# Patient Record
Sex: Male | Born: 1945 | Race: Black or African American | Hispanic: No | Marital: Married | Smoking: Former smoker
Health system: Southern US, Community
[De-identification: ages and names within clinical notes are randomized; demographics above are authoritative.]

## PROBLEM LIST (undated history)

## (undated) DIAGNOSIS — I10 Essential (primary) hypertension: Secondary | ICD-10-CM

## (undated) DIAGNOSIS — R768 Other specified abnormal immunological findings in serum: Secondary | ICD-10-CM

## (undated) DIAGNOSIS — E785 Hyperlipidemia, unspecified: Secondary | ICD-10-CM

## (undated) DIAGNOSIS — K219 Gastro-esophageal reflux disease without esophagitis: Secondary | ICD-10-CM

## (undated) DIAGNOSIS — J449 Chronic obstructive pulmonary disease, unspecified: Secondary | ICD-10-CM

## (undated) DIAGNOSIS — M79609 Pain in unspecified limb: Secondary | ICD-10-CM

## (undated) DIAGNOSIS — M1990 Unspecified osteoarthritis, unspecified site: Secondary | ICD-10-CM

## (undated) DIAGNOSIS — Z86010 Personal history of colonic polyps: Secondary | ICD-10-CM

## (undated) DIAGNOSIS — R972 Elevated prostate specific antigen [PSA]: Secondary | ICD-10-CM

## (undated) DIAGNOSIS — Z87898 Personal history of other specified conditions: Secondary | ICD-10-CM

## (undated) DIAGNOSIS — M549 Dorsalgia, unspecified: Secondary | ICD-10-CM

## (undated) DIAGNOSIS — IMO0001 Reserved for inherently not codable concepts without codable children: Secondary | ICD-10-CM

## (undated) DIAGNOSIS — J309 Allergic rhinitis, unspecified: Secondary | ICD-10-CM

## (undated) DIAGNOSIS — K5790 Diverticulosis of intestine, part unspecified, without perforation or abscess without bleeding: Secondary | ICD-10-CM

## (undated) HISTORY — DX: Personal history of other specified conditions: Z87.898

## (undated) HISTORY — PX: FOOT SURGERY: SHX648

## (undated) HISTORY — DX: Elevated prostate specific antigen (PSA): R97.20

## (undated) HISTORY — DX: Gastro-esophageal reflux disease without esophagitis: K21.9

## (undated) HISTORY — DX: Allergic rhinitis, unspecified: J30.9

## (undated) HISTORY — DX: Other specified abnormal immunological findings in serum: R76.8

## (undated) HISTORY — PX: COLONOSCOPY: SHX174

## (undated) HISTORY — DX: Essential (primary) hypertension: I10

## (undated) HISTORY — DX: Hyperlipidemia, unspecified: E78.5

## (undated) HISTORY — DX: Personal history of colonic polyps: Z86.010

## (undated) HISTORY — DX: Pain in unspecified limb: M79.609

## (undated) HISTORY — DX: Dorsalgia, unspecified: M54.9

## (undated) HISTORY — DX: Unspecified osteoarthritis, unspecified site: M19.90

## (undated) HISTORY — DX: Diverticulosis of intestine, part unspecified, without perforation or abscess without bleeding: K57.90

---

## 2000-05-08 ENCOUNTER — Inpatient Hospital Stay (HOSPITAL_COMMUNITY): Admission: EM | Admit: 2000-05-08 | Discharge: 2000-05-09 | Payer: Self-pay | Admitting: *Deleted

## 2001-03-05 ENCOUNTER — Encounter: Payer: Self-pay | Admitting: Emergency Medicine

## 2001-03-05 ENCOUNTER — Emergency Department (HOSPITAL_COMMUNITY): Admission: EM | Admit: 2001-03-05 | Discharge: 2001-03-05 | Payer: Self-pay | Admitting: Emergency Medicine

## 2002-02-01 ENCOUNTER — Encounter: Payer: Self-pay | Admitting: Orthopedic Surgery

## 2002-02-03 ENCOUNTER — Inpatient Hospital Stay (HOSPITAL_COMMUNITY): Admission: RE | Admit: 2002-02-03 | Discharge: 2002-02-04 | Payer: Self-pay | Admitting: Orthopedic Surgery

## 2002-02-03 ENCOUNTER — Encounter: Payer: Self-pay | Admitting: Orthopedic Surgery

## 2003-12-27 ENCOUNTER — Observation Stay (HOSPITAL_COMMUNITY): Admission: RE | Admit: 2003-12-27 | Discharge: 2003-12-28 | Payer: Self-pay | Admitting: Orthopedic Surgery

## 2004-10-16 ENCOUNTER — Ambulatory Visit (HOSPITAL_BASED_OUTPATIENT_CLINIC_OR_DEPARTMENT_OTHER): Admission: RE | Admit: 2004-10-16 | Discharge: 2004-10-16 | Payer: Self-pay | Admitting: Orthopedic Surgery

## 2004-10-16 ENCOUNTER — Ambulatory Visit (HOSPITAL_COMMUNITY): Admission: RE | Admit: 2004-10-16 | Discharge: 2004-10-16 | Payer: Self-pay | Admitting: Orthopedic Surgery

## 2004-11-17 ENCOUNTER — Ambulatory Visit: Payer: Self-pay | Admitting: Internal Medicine

## 2004-11-20 ENCOUNTER — Ambulatory Visit: Payer: Self-pay | Admitting: Internal Medicine

## 2005-11-30 ENCOUNTER — Ambulatory Visit: Payer: Self-pay | Admitting: Internal Medicine

## 2005-12-03 ENCOUNTER — Ambulatory Visit: Payer: Self-pay | Admitting: Internal Medicine

## 2007-02-10 ENCOUNTER — Ambulatory Visit: Payer: Self-pay | Admitting: Internal Medicine

## 2007-02-10 LAB — CONVERTED CEMR LAB
Albumin: 3.9 g/dL (ref 3.5–5.2)
Basophils Absolute: 0 10*3/uL (ref 0.0–0.1)
Bilirubin Urine: NEGATIVE
Bilirubin, Direct: 0.3 mg/dL (ref 0.0–0.3)
CO2: 23 meq/L (ref 19–32)
Calcium: 9.7 mg/dL (ref 8.4–10.5)
Chloride: 105 meq/L (ref 96–112)
Direct LDL: 118.3 mg/dL
Eosinophils Absolute: 0.1 10*3/uL (ref 0.0–0.6)
GFR calc Af Amer: 79 mL/min
HDL: 36.3 mg/dL — ABNORMAL LOW (ref >39.0)
Hemoglobin, Urine: NEGATIVE
MCHC: 34.8 g/dL (ref 30.0–36.0)
MCV: 95.9 fL (ref 78.0–100.0)
Monocytes Relative: 9.1 % (ref 3.0–11.0)
Neutro Abs: 5.5 10*3/uL (ref 1.4–7.7)
Neutrophils Relative %: 56.4 % (ref 43.0–77.0)
PSA: 12.17 ng/mL — ABNORMAL HIGH (ref 0.10–4.00)
Potassium: 4.2 meq/L (ref 3.5–5.1)
RBC: 4.13 M/uL — ABNORMAL LOW (ref 4.22–5.81)
RDW: 11.4 % — ABNORMAL LOW (ref 11.5–14.6)
Specific Gravity, Urine: 1.025 (ref 1.000–1.03)
TSH: 0.33 microintl units/mL — ABNORMAL LOW (ref 0.35–5.50)
Total Bilirubin: 1.1 mg/dL (ref 0.3–1.2)
Total Protein, Urine: NEGATIVE mg/dL
Total Protein: 7.6 g/dL (ref 6.0–8.3)
Urine Glucose: NEGATIVE mg/dL
VLDL: 65 mg/dL — ABNORMAL HIGH (ref 0–40)
pH: 6 (ref 5.0–8.0)

## 2007-02-16 ENCOUNTER — Encounter: Payer: Self-pay | Admitting: Internal Medicine

## 2007-02-16 ENCOUNTER — Ambulatory Visit: Payer: Self-pay | Admitting: Internal Medicine

## 2007-02-16 DIAGNOSIS — J309 Allergic rhinitis, unspecified: Secondary | ICD-10-CM

## 2007-02-16 DIAGNOSIS — E785 Hyperlipidemia, unspecified: Secondary | ICD-10-CM

## 2007-02-16 DIAGNOSIS — R972 Elevated prostate specific antigen [PSA]: Secondary | ICD-10-CM

## 2007-02-16 DIAGNOSIS — I10 Essential (primary) hypertension: Secondary | ICD-10-CM

## 2007-02-16 DIAGNOSIS — M549 Dorsalgia, unspecified: Secondary | ICD-10-CM

## 2007-02-16 HISTORY — DX: Dorsalgia, unspecified: M54.9

## 2007-02-16 HISTORY — DX: Hyperlipidemia, unspecified: E78.5

## 2007-02-16 HISTORY — DX: Elevated prostate specific antigen (PSA): R97.20

## 2007-02-16 HISTORY — DX: Allergic rhinitis, unspecified: J30.9

## 2007-02-16 HISTORY — DX: Essential (primary) hypertension: I10

## 2007-02-25 ENCOUNTER — Ambulatory Visit: Payer: Self-pay | Admitting: Internal Medicine

## 2007-03-08 ENCOUNTER — Ambulatory Visit: Payer: Self-pay | Admitting: Internal Medicine

## 2007-03-08 ENCOUNTER — Encounter: Payer: Self-pay | Admitting: Internal Medicine

## 2007-03-08 DIAGNOSIS — Z860100 Personal history of colon polyps, unspecified: Secondary | ICD-10-CM

## 2007-03-08 HISTORY — DX: Personal history of colonic polyps: Z86.010

## 2007-03-08 HISTORY — DX: Personal history of colon polyps, unspecified: Z86.0100

## 2007-03-08 LAB — HM COLONOSCOPY

## 2008-02-27 ENCOUNTER — Ambulatory Visit: Payer: Self-pay | Admitting: Internal Medicine

## 2008-02-27 DIAGNOSIS — Z87898 Personal history of other specified conditions: Secondary | ICD-10-CM

## 2008-02-27 DIAGNOSIS — M79609 Pain in unspecified limb: Secondary | ICD-10-CM

## 2008-02-27 HISTORY — DX: Pain in unspecified limb: M79.609

## 2008-02-27 HISTORY — DX: Personal history of other specified conditions: Z87.898

## 2008-02-27 LAB — CONVERTED CEMR LAB
BUN: 13 mg/dL (ref 6–23)
Bilirubin, Direct: 0.1 mg/dL (ref 0.0–0.3)
CO2: 26 meq/L (ref 19–32)
Calcium: 9.7 mg/dL (ref 8.4–10.5)
Chloride: 103 meq/L (ref 96–112)
Cholesterol: 159 mg/dL (ref 0–200)
Creatinine, Ser: 1.2 mg/dL (ref 0.4–1.5)
Potassium: 3.9 meq/L (ref 3.5–5.1)
Sodium: 137 meq/L (ref 135–145)
Total Bilirubin: 0.8 mg/dL (ref 0.3–1.2)
Total CHOL/HDL Ratio: 3.6

## 2008-02-28 ENCOUNTER — Encounter: Payer: Self-pay | Admitting: Internal Medicine

## 2008-03-16 ENCOUNTER — Encounter: Payer: Self-pay | Admitting: Internal Medicine

## 2008-10-19 ENCOUNTER — Telehealth: Payer: Self-pay | Admitting: Internal Medicine

## 2009-02-13 ENCOUNTER — Telehealth (INDEPENDENT_AMBULATORY_CARE_PROVIDER_SITE_OTHER): Payer: Self-pay | Admitting: *Deleted

## 2009-03-25 ENCOUNTER — Ambulatory Visit: Payer: Self-pay | Admitting: Internal Medicine

## 2009-03-25 LAB — CONVERTED CEMR LAB
ALT: 16 units/L (ref 0–53)
AST: 25 units/L (ref 0–37)
Albumin: 4.2 g/dL (ref 3.5–5.2)
BUN: 13 mg/dL (ref 6–23)
Basophils Absolute: 0 10*3/uL (ref 0.0–0.1)
Bilirubin, Direct: 0.1 mg/dL (ref 0.0–0.3)
Chloride: 102 meq/L (ref 96–112)
Cholesterol: 160 mg/dL (ref 0–200)
Eosinophils Relative: 0.4 % (ref 0.0–5.0)
GFR calc non Af Amer: 78.51 mL/min (ref >60)
HDL: 47.7 mg/dL (ref >39.00)
Hemoglobin: 14.6 g/dL (ref 13.0–17.0)
LDL Cholesterol: 97 mg/dL (ref 0–99)
Lymphocytes Relative: 27.2 % (ref 12.0–46.0)
Lymphs Abs: 2.5 10*3/uL (ref 0.7–4.0)
MCHC: 32.8 g/dL (ref 30.0–36.0)
MCV: 100.8 fL — ABNORMAL HIGH (ref 78.0–100.0)
Monocytes Absolute: 0.7 10*3/uL (ref 0.1–1.0)
Monocytes Relative: 7.8 % (ref 3.0–12.0)
Neutrophils Relative %: 64.2 % (ref 43.0–77.0)
Potassium: 4 meq/L (ref 3.5–5.1)
RBC: 4.41 M/uL (ref 4.22–5.81)
RDW: 11.6 % (ref 11.5–14.6)
Total CHOL/HDL Ratio: 3
VLDL: 15.2 mg/dL (ref 0.0–40.0)

## 2009-06-11 ENCOUNTER — Encounter: Payer: Self-pay | Admitting: Internal Medicine

## 2010-03-25 ENCOUNTER — Encounter (INDEPENDENT_AMBULATORY_CARE_PROVIDER_SITE_OTHER): Payer: Self-pay | Admitting: *Deleted

## 2010-03-27 ENCOUNTER — Telehealth: Payer: Self-pay | Admitting: Internal Medicine

## 2010-05-09 ENCOUNTER — Other Ambulatory Visit: Payer: Self-pay | Admitting: Internal Medicine

## 2010-05-09 ENCOUNTER — Encounter: Payer: Self-pay | Admitting: Internal Medicine

## 2010-05-09 ENCOUNTER — Ambulatory Visit
Admission: RE | Admit: 2010-05-09 | Discharge: 2010-05-09 | Payer: Self-pay | Source: Home / Self Care | Attending: Internal Medicine | Admitting: Internal Medicine

## 2010-05-09 LAB — CBC WITH DIFFERENTIAL/PLATELET
Basophils Absolute: 0 10*3/uL (ref 0.0–0.1)
Basophils Relative: 0.3 % (ref 0.0–3.0)
Eosinophils Absolute: 0 10*3/uL (ref 0.0–0.7)
Eosinophils Relative: 0.2 % (ref 0.0–5.0)
HCT: 42.5 % (ref 39.0–52.0)
Hemoglobin: 14.5 g/dL (ref 13.0–17.0)
Lymphocytes Relative: 17.6 % (ref 12.0–46.0)
Lymphs Abs: 1.6 10*3/uL (ref 0.7–4.0)
MCHC: 34.2 g/dL (ref 30.0–36.0)
MCV: 96.1 fl (ref 78.0–100.0)
Monocytes Absolute: 0.7 10*3/uL (ref 0.1–1.0)
Monocytes Relative: 7 % (ref 3.0–12.0)
Neutro Abs: 6.9 10*3/uL (ref 1.4–7.7)
Neutrophils Relative %: 74.9 % (ref 43.0–77.0)
Platelets: 238 10*3/uL (ref 150.0–400.0)
RBC: 4.42 Mil/uL (ref 4.22–5.81)
RDW: 12.6 % (ref 11.5–14.6)
WBC: 9.3 10*3/uL (ref 4.5–10.5)

## 2010-05-09 LAB — LIPID PANEL
Cholesterol: 170 mg/dL (ref 0–200)
HDL: 44.5 mg/dL (ref >39.00)
LDL Cholesterol: 101 mg/dL — ABNORMAL HIGH (ref 0–99)
Total CHOL/HDL Ratio: 4
Triglycerides: 123 mg/dL (ref 0.0–149.0)
VLDL: 24.6 mg/dL (ref 0.0–40.0)

## 2010-05-09 LAB — BASIC METABOLIC PANEL
BUN: 24 mg/dL — ABNORMAL HIGH (ref 6–23)
CO2: 28 mEq/L (ref 19–32)
Calcium: 9.7 mg/dL (ref 8.4–10.5)
Chloride: 104 mEq/L (ref 96–112)
Creatinine, Ser: 1.4 mg/dL (ref 0.4–1.5)
GFR: 65.48 mL/min (ref >60.00)
Glucose, Bld: 103 mg/dL — ABNORMAL HIGH (ref 70–99)
Potassium: 4.6 mEq/L (ref 3.5–5.1)
Sodium: 137 mEq/L (ref 135–145)

## 2010-05-09 LAB — HEPATIC FUNCTION PANEL
ALT: 18 U/L (ref 0–53)
AST: 27 U/L (ref 0–37)
Albumin: 4.2 g/dL (ref 3.5–5.2)
Alkaline Phosphatase: 72 U/L (ref 39–117)
Bilirubin, Direct: 0.1 mg/dL (ref 0.0–0.3)
Total Bilirubin: 1 mg/dL (ref 0.3–1.2)
Total Protein: 7.7 g/dL (ref 6.0–8.3)

## 2010-05-09 LAB — TSH: TSH: 0.3 u[IU]/mL — ABNORMAL LOW (ref 0.35–5.50)

## 2010-05-27 NOTE — Medication Information (Signed)
Summary: [facility]  [facility]   Imported By: Sherian Rein 06/15/2009 10:04:44  _____________________________________________________________________  External Attachment:    Type:   Image     Comment:   External Document

## 2010-05-27 NOTE — Progress Notes (Signed)
  Phone Note Call from Patient Call back at Home Phone [phone]   Caller: Patient Call For: Jack Navy MD Summary of Call: Pt sched appt for yearly f/u on Jan 13th- --2012. Pt needs refills bp & cholestrol [facility]. Initial call taken by: Verdell Face,  March 27, 2010 9:16 AM    Prescriptions: ZESTORETIC 20-12.5 MG TABS (LISINOPRIL-HYDROCHLOROTHIAZIDE) Take 1 tablet by mouth once a day  #30 Tablet x 1   Entered by:   Ami Bullins CMA   Authorized by:   Jack Navy MD   Signed by:   Bill Salinas CMA on 03/27/2010   Method used:   [facility]ronically [facility]        [id number] ([facility]ail[facility]       [address]       [place], [place]  [address]       Ph: [phone] or [phone]       Fax: [phone]   RxID:   [id number] SIMVASTATIN 40 MG TABS (SIMVASTATIN) 1 by mouth q PM  #30 x 1   Entered by:   Bill Salinas CMA   Authorized by:   Jack Navy MD   Signed by:   Bill Salinas CMA on 03/27/2010   Method used:   Electronically to        [facility] [phone][facility] (retail)       [address]       [place], [place]  [address]       Ph: [phone] or [phone]       Fax: [phone]   RxID:   [id number]

## 2010-05-27 NOTE — Medication Information (Signed)
Summary: [facility]  [facility]   Imported By: Sherian Rein 06/15/2009 10:06:12  _____________________________________________________________________  External Attachment:    Type:   Image     Comment:   External Document

## 2010-05-27 NOTE — Letter (Signed)
Summary: Colonoscopy Letter  [facility]  [address] [place], [place] [address]   Phone: [phone]  Fax: [phone]      March 25, 2010 MRN: [id number]   [facility] [address] [place], [place]  [address]   Dear Mr. Hislop,   According to your medical record, it is time for you to schedule a Colonoscopy. The American Cancer Society recommends this procedure as a method to detect early colon cancer. Patients with a family history of colon cancer, or a personal history of colon polyps or inflammatory bowel disease are at increased risk.  This letter has been generated based on the recommendations made at the time of your procedure. If you feel that in your particular situation this may no longer apply, please contact our office.  Please call our office at [phone] to schedule this appointment or to update your records at your earliest convenience.  Thank you for cooperating with [place] to provide you with the very best care possible.   Sincerely,   Iva Boop, M.D.  [facility] G[facility] Division [phone]

## 2010-05-29 NOTE — Assessment & Plan Note (Signed)
Summary: yearly f/u /medicare / #/ cd   Vital Signs:  Patient profile:   65 year old male Height:      68 inches Weight:      202 pounds BMI:     30.83 Temp:     98.1 degrees F oral Pulse rate:   115 / minute BP sitting:   120 / 72  (left arm) Cuff size:   regular  Vitals Entered By: Lamar Sprinkles, CMA (May 09, 2010 9:47 AM) CC: Medicare wellness   Primary Care Provider:  Rice Walsh  CC:  Medicare wellness.  History of Present Illness: patient presents today for a routine medical exam. In the interval since his last exam he has been doing well.   He does have chronic sinus congestion despite taking the zyrtec D.   He has been following with Dr. Aldean Ast for prostate health with elevated PSA but no diagnosis of prostate cancer. He last saw him n June of '11 and is doing well, able to drop back to once a year visits.   He remains 100% independent in is ADLs. He has no signs or symptoms of depression. He has had no falls and has no increase in fall risk. He is cognitively intact.  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  Family History: fathe- deceased age ?r-  HTN, CAD mother deceased 106- throat cancer, lung cancer brother - died related to alcohol disease brother - sudden death 27 MI brother - died in MVA Sister - deceased - cancer  Social History: 11th grade education  married 1968 2 sons- '69, '77: 1 daughter - '80; 5 grandsons retired on disability from foot injury. Had worked as a Midwife marriage in good health - wife smokes 3 pks/daySmoking Status:  quit > 6 months Packs/Day:  <0.25 Dental Care w/in 6 mos.:  yes Seat Belt Use:  yes  Review of Systems       The patient complains of decreased hearing.  The patient denies anorexia, fever, weight loss, weight gain, vision loss, hoarseness, chest pain, syncope, dyspnea on exertion, peripheral edema, headaches, hemoptysis, abdominal pain, severe indigestion/heartburn, incontinence, genital sores, suspicious skin lesions, transient blindness, depression, unusual weight change, enlarged lymph nodes, and angioedema.    Physical Exam  General:  Well-developed,well-nourished,in no acute distress; alert,appropriate and cooperative throughout examination Head:  normocephalic, atraumatic, and no abnormalities observed.   Eyes:  vision grossly intact, pupils equal, pupils round, pupils reactive to light, corneas and lenses clear, no injection, no optic disk abnormalities, and no retinal abnormalitiies.   Ears:  External ear exam shows no significant lesions or deformities.  Otoscopic examination reveals clear canals, tympanic  membranes are intact bilaterally without bulging, retraction, inflammation or discharge. Hearing is grossly normal bilaterally. Nose:  no external deformity, no external erythema, and no nasal discharge.   Mouth:  Oral mucosa and oropharynx without lesions or exudates.  Teeth in good repair. Neck:  supple, full ROM, no masses, no thyromegaly, and no carotid bruits.   Chest Wall:  No deformities, masses, tenderness or gynecomastia noted. Lungs:  Normal respiratory  effort, chest expands symmetrically. Lungs are clear to auscultation, no crackles or wheezes. Heart:  Normal rate and regular rhythm. S1 and S2 normal without gallop, murmur, click, rub or other extra sounds. Abdomen:  soft, non-tender, normal bowel sounds, no guarding, no abdominal hernia, and no hepatomegaly.   Rectal:  deferred to GU Prostate:  deferred to GU Msk:  normal ROM, no joint tenderness, no joint swelling, no joint warmth, no redness over joints, and no joint instability.   Pulses:  2_ radial and DP pulses Extremities:  No clubbing, cyanosis, edema, or deformity noted with normal full range of motion of all joints.   Neurologic:  alert & oriented X3, cranial nerves II-XII intact, strength normal in all extremities, sensation intact to light touch, gait normal, and DTRs symmetrical and normal.   Skin:  turgor normal, color normal, no suspicious lesions, and no ulcerations.   Cervical Nodes:  no anterior cervical adenopathy and no posterior cervical adenopathy.   Inguinal Nodes:  no R inguinal adenopathy and no L inguinal adenopathy.   Psych:  Oriented X3, memory intact for recent and remote, normally interactive, good eye contact, and not anxious appearing.     Impression & Recommendations:  Problem # 1:  PROSTATE SPECIFIC ANTIGEN, ELEVATED (ICD-790.93) Doing well under the care of Dr. Aldean Ast  Problem # 2:  HYPERTENSION (ICD-401.9)  His updated medication list for this problem includes:    Zestoretic 20-12.5 Mg Tabs (Lisinopril-hydrochlorothiazide) .Marland Kitchen... Take 1 tablet by mouth once a day  Orders: TLB-BMP (Basic Metabolic Panel-BMET) (80048-METABOL)  BP today: 120/72 Prior BP: 118/80 (03/25/2009)  Good control on present medications. Will continue the same.  Problem # 3:  HYPERLIPIDEMIA (ICD-272.4)  His updated medication list for this problem includes:    Simvastatin 40 Mg Tabs (Simvastatin) .Marland Kitchen... 1 by mouth q pm  Orders: TLB-Lipid Panel  (80061-LIPID) TLB-Hepatic/Liver Function Pnl (80076-HEPATIC) TLB-TSH (Thyroid Stimulating Hormone) (84443-TSH)  Addendum - LDL 101 ia very close to gal of 100 or less. Liver functions are normal.  Plan - continue present medication.  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 He is able to walk without assistance and has minimal pain.   Problem # 5:  ALLERGIC RHINITIS (ICD-477.9) He c/o chronic nasal congestion despite use of anti-histamine with decongestant.  Pla - trial of nasal inhalational nasal steroid - fluticasone.   His updated medication list for this problem includes:    Fluticasone Propionate 50 Mcg/act Susp (Fluticasone propionate) .Marland Kitchen... 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  Orders: TLB-CBC Platelet - w/Differential (85025-CBCD)  Problem # 6:  Preventive Health Care (ICD-V70.0)  Unremarkable interval history. His physical exam is normal. Lab results are within normal limits except that TSH is borderline low -no treatment required. Last colonoscopy Nov'08. Immunizations: tetnus October '08; Pneumovax Nov '09. EKG reveals no evidence of ischemia.  In summary - a very nice man who appears to be medically stable. He will return in 1 year or as needed.  Orders: Medicare -1st Annual Wellness Visit [phone])  Complete Medication List: 1)  Clidinium-chlordiazepoxide 2.5-5 Mg Caps (Clidinium-chlordiazepoxide) .... Unit 2)  Simvastatin 40 Mg Tabs (Simvastatin) .Marland Kitchen.. 1 by mouth q pm 3)  Nabumetone 750 Mg Tabs (Nabumetone) .... Take 2 tablet by mouth every night 4)  Zestoretic 20-12.5 Mg Tabs (Lisinopril-hydrochlorothiazide) .... Take 1 tablet by mouth once a day 5)  Zyrtec-d Allergy & Congestion 5-120 Mg Tb12 (Cetirizine-pseudoephedrine) .... Unit 6)  Finasteride 5 Mg Tabs (Finasteride) .... Take 1 tablet everyday as needed 7)  Fluticasone Propionate 50 Mcg/act Susp  (Fluticasone propionate) .Marland Kitchen.. 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  Patient: Jack [name] Note: All result statuses are Final unless otherwise noted.  Tests: (1) BMP (METABOL)   Sodium                    137 mEq/L                   135-145   Potassium                 4.6 mEq/L                   3.5-5.1   Chloride                  104 mEq/L                   96-112   Carbon Dioxide            28 mEq/L                    19-32   Glucose              [H]  103 mg/dL                   60-45   BUN                  [H]  24 mg/dL                    4-09   Creatinine                1.4 mg/dL                   8.1-1.9   Calcium                   9.7 mg/dL                   1.4-78.2   GFR                       65.48 mL/min                >60.00  Tests: (2) Lipid Panel (LIPID)   Cholesterol               170 mg/dL                   9-562     ATP III Classification            Desirable:  < 200 mg/dL                    Borderline High:  200 - 239 mg/dL  High:  > = 240 mg/dL   Triglycerides             123.0 mg/dL                 1.6-109.6     Normal:  <150 mg/dL     Borderline High:  045 - 199 mg/dL   HDL                       40.98 mg/dL                 >11.91   VLDL Cholesterol          24.6 mg/dL                  4.7-82.9   LDL Cholesterol      [H]  562 mg/dL                   1-30  CHO/HDL Ratio:  CHD Risk                             4                    Men          Women     1/2 Average Risk     3.4          3.3     Average Risk          5.0          4.4     2X Average Risk          9.6          7.1     3X Average Risk          15.0          11.0                           Tests: (3) Hepatic/Liver Function Panel (HEPATIC)   Total Bilirubin           1.0 mg/dL                   8.6-5.7   Direct Bilirubin          0.1 mg/dL                   8.4-6.9   Alkaline Phosphatase      72 U/L                      39-117   AST                        27 U/L                      0-37   ALT                       18 U/L                      0-53   Total Protein             7.7 g/dL  6.0-8.3   Albumin                   4.2 g/dL                    0.4-5.4  Tests: (4) TSH (TSH)   FastTSH              [L]  0.30 uIU/mL                 0.35-5.50  Tests: (5) CBC Platelet w/Diff (CBCD)   White Cell Count          9.3 K/uL                    4.5-10.5   Red Cell Count            4.42 Mil/uL                 4.22-5.81   Hemoglobin                14.5 g/dL                   09.8-11.9   Hematocrit                42.5 %                      39.0-52.0   MCV                       96.1 fl                     78.0-100.0   MCHC                      34.2 g/dL                   14.7-82.9   RDW                       12.6 %                      11.5-14.6   Platelet Count            238.0 K/uL                  150.0-400.0   Neutrophil %              74.9 %                      43.0-77.0   Lymphocyte %              17.6 %                      12.0-46.0   Monocyte %                7.0 %                       3.0-12.0   Eosinophils%              0.2 %                       0.0-5.0   Basophils %  0.3 %                       0.0-3.0   Neutrophill Absolute      6.9 K/uL                    1.4-7.7   Lymphocyte Absolute       1.6 K/uL                    0.7-4.0   Monocyte Absolute         0.7 K/uL                    0.1-1.0  Eosinophils, Absolute                             0.0 K/uL                    0.0-0.7   Basophils Absolute        0.0 K/uL                    0.0-0.1Prescriptions: FLUTICASONE PROPIONATE 50 MCG/ACT SUSP (FLUTICASONE PROPIONATE) 1 spray per nostril once a day for chonic allergic rhinnitis with nasal congestion.  #1bottle x 12   Entered and Authorized by:   Jacques Navy MD   Signed by:   Jacques Navy MD on 05/10/2010   Method used:   [facility]        [phone][facility] ([facility]ail[facility]        [address]       [place], [place]  [address]       Ph: [phone] or [phone]       Fax: [phone]   RxID:   [id number] ZESTORETIC 20-12.5 MG TABS (LISINOPRIL-HYDROCHLOROTHIAZIDE) Take 1 tablet by mouth once a day  #30 Tablet x 1   Entered and Authorized by:   Jacques Navy MD   Signed by:   Jacques Navy MD on 05/09/2010   Method used:   Electronically to        [facility] [phone][facility] (retail)       [address]       [place], [place]  [address]       Ph: [phone] or [phone]       Fax: [phone]   RxID:   [id number] SIMVASTATIN 40 MG TABS (SIMVASTATIN) 1 by mouth q PM  #30 x 1   Entered and Authorized by:   Jacques Navy MD   Signed by:   Jacques Navy MD on 05/09/2010   Method used:   Electronically to        [facility] [phone]* (retail)       [address]       [place], [place]  [address]       Ph: [phone] or [phone]       Fax: [phone]   RxID:   [id number] CLIDINIUM-CHLORDIAZEPOXIDE 2.5-5 MG CAPS (CLIDINIUM-CHLORDIAZEPOXIDE) unit  #30 Capsule x PRN   Entered and Authorized by:   Jacques Navy MD   Signed by:   Jacques Navy MD on 05/09/2010   Method used:   [facility]        [facility] [facility] [phone][facility] (retail)       [address]  [place], [place]  [address]       Ph: [phone] or [phone]       Fax: [phone]   RxID:   [id number]    Orders Added: 1)  Medicare -1st Annual Wellness Visit [G0438] 2)  Est. Patient Level III [[address]] 3)  TLB-BMP (Basic Metabolic Panel-BMET) [80048-METABOL] 4)  TLB-Lipid Panel [80061-LIPID] 5)  TLB-Hepatic/Liver Function Pnl [80076-HEPATIC] 6)  TLB-TSH (Thyroid Stimulating Hormone) [84443-TSH] 7)  TLB-CBC Platelet - w/Differential [85025-CBCD]

## 2010-07-27 ENCOUNTER — Other Ambulatory Visit: Payer: Self-pay | Admitting: Internal Medicine

## 2010-09-12 NOTE — H&P (Signed)
   NAME:  Jack [name], FLETT NO.:  [id number]   MEDICAL RECORD NO.:  [id number]                   PATIENT TYPE:  INP   LOCATION:  2899                                 FACILITY:  [facility]   PHYSICIAN:  Nadara Mustard, M.D.                DATE OF BIRTH:  1945-09-05   DATE OF ADMISSION:  02/03/2002  DATE OF DISCHARGE:                                HISTORY & PHYSICAL   HISTORY OF PRESENT ILLNESS:  The patient is a 65 year old gentleman who is  10 months status post a right calcaneal fracture, who underwent appropriate  closed treatment.  The patient has had progressive pain, unable to perform  his activities of daily living and unable to work. Radiographs and CT scan  showed cystic collapse of the subtalar joint.  After failure of conservative  care, the patient presents at this time for a subtalar fusion.   ALLERGIES:  No known drug allergies.   MEDICATIONS:  1. Zyrtec D one p.o. b.i.d.  2. Celebrex 200 mg q.d.  3. Clidinium one q.i.d.  4. Nasacort two puffs q.d.   PAST SURGICAL HISTORY:  None.   FAMILY HISTORY:  Positive for hypertension.   SOCIAL HISTORY:  Negative for alcohol, negative for tobacco. Works as a  Naval architect.   REVIEW OF SYSTEMS:  Positive for hypertension and prostate problems.   PHYSICAL EXAMINATION:  VITAL SIGNS:  Temperature 97.3, pulse 84, respiratory  rate 20, blood pressure 152/88.  GENERAL:  The patient is in no acute distress.  LUNGS:  Clear to auscultation.  HEART:  Regular rate and rhythm.  NECK:  Supple with no bruits.  EXTREMITIES:  Examination of the right foot, he has pain with 10 degrees  inversion and eversion.  He has 30 degrees of inversion and eversion on the  left. He has dorsiflexion to neutral. He has good dorsalis pedis pulse.  He  has pain to palpation of the sinus process.  CT scan showed large cystic  changes in the os calcis with degenerative changes of the posterior facet.   ASSESSMENT:  Arthrosis of  subtalar joint, status post calcaneal fracture.    PLAN:  Will go ahead and set him up for a right subtalar fusion. The risks  and benefits were discussed including infection, neurovascular injury,  persistent pain, failure of fusion, and need for additional surgery. The  patient states he understands and wishes to proceed at this time.                                               Nadara Mustard, M.D.    MVD/MEDQ  D:  02/03/2002  T:  02/06/2002  Job:  [id number]

## 2010-09-12 NOTE — Op Note (Signed)
NAME:  KARLON, SCHLAFER NO.:  [id number]   MEDICAL RECORD NO.:  [id number]          PATIENT TYPE:  AMB   LOCATION:  DSC                          FACILITY:  [facility]   PHYSICIAN:  Nadara Mustard, MD     DATE OF BIRTH:  [birth year]   DATE OF PROCEDURE:  10/16/2004  DATE OF DISCHARGE:                                 OPERATIVE REPORT   PREOPERATIVE DIAGNOSIS:  Painful retained hardware, right ankle, status post  tibiocalcaneal fusion.   PROCEDURE:  Removal of the deep hardware, right ankle.   SURGEON.:  Nadara Mustard, MD   ANESTHESIA:  MAC plus 9 mL of 1% lidocaine local.   DRAINS:  None.   COMPLICATIONS:  None.   DISPOSITION:  To PACU in stable condition.   INDICATION FOR PROCEDURE:  The patient is a 65 year old gentleman status  post tibiocalcaneal fusion.  The patient has undergone treatment and  immobilization, and has healed his fusion site; however, he has backing-out  of 2 of the deep retained screws and presents at this time for removal of  the deep screws which are painful.  Risks and benefits of surgery were  discussed including infection, neurovascular injury, need for additional  surgery.  The patient states he understands and wishes to proceed at this  time.   DESCRIPTION OF PROCEDURE:  The patient was brought to OR room 6 and  underwent a MAC anesthetic.  The patient's right lower extremity was then  prepped using DuraPrep and draped into a sterile field.  Nine milliliters of  1% lidocaine plain were used to locally anesthetize the surgical field.  A  small 1-cm incision was made.  Sharp dissection was carried down to the 2  screws.  The 2 screws were grabbed with pliers and removed.  There was no  purulence, no drainage, no evidence of infection.  The wound was irrigated  with normal saline.  Hemostasis was obtained.  The incision was closed using  3-0 nylon.  The wound was covered with Adaptic, orthopedic sponges, sterile  Webril and a Coban  dressing.  The patient was taken to PACU in stable  condition.   Plan for discharge to home, prescription for Vicodin for pain, change  dressing in 3 days, weightbearing as tolerated without restrictions.       MVD/MEDQ  D:  10/16/2004  T:  10/17/2004  Job:  [id number]

## 2010-09-12 NOTE — Assessment & Plan Note (Signed)
[facility]                             PRIMARY CARE OFFICE NOTE   NAME:Jack [name], Jack [name]                        MRN:          [id number]  DATE:12/03/2005                            DOB:          07-23-1945    Jack [name] is a very pleasant 65 year old [place] American gentleman who  presents for follow-up evaluation and exam.  He was last seen in the office  on November 20, 2004.  In the interval he has been seen by Dr. Aldean Baker.  The  patient continues to have some pain and discomfort from a repair of a  traumatic injury to his leg, which required three full surgeries.  He still  has some hypersensitivity in this area.  He still wears a brace.   The patient has been followed by Dr. Aldean Ast for an elevated PSA.  He is  seen every 6 months.  The patient did have a biopsy of the prostate, which,  by his report, was negative, but he continues to have an elevated PSA.   PAST SURGICAL HISTORY:  Foot surgery x 3 as noted.  No other surgeries.   PAST MEDICAL HISTORY:  1. Usual childhood diseases.  2. Hypertension.  3. Hyperlipidemia.  4. Allergic rhinitis.  5. Elevated PSA.  6. Shingles.   CURRENT MEDICATIONS:  1. Lipitor 10 mg daily.  2. Zestoretic 20/12.5 mg once daily.  3. Nabumetone 750 mg 2 tablets daily.  4. Clidinium 1 q day p.r.n.  5. Zyrtec D p.r.n.  6. __________ hydrocodone 5/500 from Dr. Lajoyce Corners.   FAMILY HISTORY:  Well documented in previous chart note.   SOCIAL HISTORY:  The patient has been married 37 years.  He has 2 sons and 1  daughter, 5 grandchildren.  Patient is fully disabled, but he remains  active.   REVIEW OF SYSTEMS:  Patient has had a 3 pound weight gain in the last year.  No fevers, sweats, chills or other constitutional symptoms.  It has been  more than 18 months since he had an eye exam.  Patient has a full upper  denture, lower partial with no complaints.  Patient has no cardiovascular  disease or problems.  No  respiratory complaints except for sinus congestion.  No GI or GU problems.   PHYSICAL EXAMINATION:  VITAL SIGNS:  Temperature 98.2, blood pressure  131/81, pulse 125, weigh 200.  GENERAL APPEARANCE:  A heavy-set African American gentleman, very well  muscled, who looks his stated age, in no acute distress.  HEENT:  Normocephalic, atraumatic.  __________ and TMs were normal.  Oropharynx revealed full upper dentures, lower partial.  No buccal lesions  were noted.  Posterior pharynx was clear.  Conjunctivae and sclerae were  clear.  Pupils equal, round, react to light and accommodation.  Funduscopic  exam was unremarkable.  NECK:  Supple, without thyromegaly.  No lymphadenopathy was noted in the  cervical and supraclavicular regions.  CHEST:  No CVA tenderness.  LUNGS:  Clear to auscultation and percussion.  CARDIOVASCULAR:  2+ radial pulse.  No JVD.  No coronary artery bypass.  He  had a quiet precordium with a regular rate and rhythm, without murmurs, rubs  or gallops.  ABDOMEN:  Soft.  No guarding or rebound.  No organomegaly or splenomegaly  was noted.  GENITALIA AND PROSTATE EXAMS:  Defer to Dr. Aldean Ast.  EXTREMITIES:  Patient has a well-healed surgical scar at the lateral aspect  of the distal right lower extremity.  Left leg and upper extremities are  normal.  SKIN:  Clear.  NEUROLOGICAL:  Nonfocal with patient being awake, alert, oriented to person,  place, time, and contacts.  Cranial nerves were intact.  Motor strength was  normal.  Cerebellar function was normal.   DATABASE:  Hemoglobin was 15.3 grams, white count was 11,000 with a normal  differential.  Cholesterol 197, HDL 38.9, LDL 88.5, triglycerides elevated  at 315, chemistries with a blood sugar of 102, electrolytes were normal.  Kidney function normal with a creatinine of 1.5 and estimated GFR of 61  ml/min.  Liver functions were normal.  TSH was normal at 0.42, PSA elevated  at 12.45, urinalysis was normal.    ASSESSMENT/PLAN:  1. Hypertension.  Patient is adequately controlled on his present medical      regimen.  Will continue the same.  2. GU:  The patient with an elevated PSA with a negative evaluation to      date, including negative prostate biopsy.  Plan:  Patient is to follow      with Dr. Aldean Ast as noted.  3. Lipids:  Patient with excellent control of his cholesterol on present      drugs.  His triglycerides are elevated and I have advised him to follow      a conscientious low-fat diet and to continue to try and exercise on a      regular basis.  4. Allergic rhinitis:  Patient does well with Zyrtec D.  A refill      prescription is provided.  5. Health maintenance:  Patient would be a candidate for colorectal cancer      screening, which we will need to attend to at his next office visit.   IN SUMMARY:  He is a pleasant gentleman who seems to be medically stable at  this time.  He will continue to follow up with Dr. Aldean Ast and Dr. Lajoyce Corners.  He will return to see me on an as needed basis or in 1 year.                                   Rosalyn Gess Norins, MD   MEN/MedQ  DD:  12/03/2005  DT:  12/03/2005  Job #:  [id number]   cc:   Nadara Mustard, MD  Courtney Paris, MD

## 2010-09-12 NOTE — Op Note (Signed)
NAME:  Jack [name], Jack [name] NO.:  [id number]   MEDICAL RECORD NO.:  [id number]                   PATIENT TYPE:  INP   LOCATION:  2899                                 FACILITY:  [facility]   PHYSICIAN:  Nadara Mustard, M.D.                DATE OF BIRTH:  04/05/1946   DATE OF PROCEDURE:  02/03/2002  DATE OF DISCHARGE:                                 OPERATIVE REPORT   PREOPERATIVE DIAGNOSIS:  Subtalar arthrosis, right foot.   POSTOPERATIVE DIAGNOSIS:  Subtalar arthrosis, right foot.   PROCEDURE:  Subtalar fusion with tibial correction with talar to calcaneal  fusion with a [name] medial screw.   SURGEON:  Nadara Mustard, M.D.   ANESTHESIA:  General.   ESTIMATED BLOOD LOSS:  Minimal.   ANTIBIOTICS:  1 g Kefzol.   TOURNIQUET TIME:  88 minutes at 350 mmHg.   DISPOSITION:  To PACU in stable condition with a [name] dressing.   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  The  patient has failed conservative care and presents at this time for surgical  intervention.  The risks and benefits were discussed.  The patient states he  understands and wishes to proceed at this time.   DESCRIPTION OF PROCEDURE:  The patient was brought to OR room 5 and  underwent a general anesthetic.  After adequate level of anesthesia  obtained, the patient was placed supine on the [name] table and the right  lower extremity was prepped using Duraprep, draped into a sterile field.  An  [name] was used to cover all exposed skin.  The leg was elevated and the  tourniquet inflated to 350 mmHg at the thigh.  The oblique incision was made  across the sinus tarsi.  This was carried through the sinus tarsi fat pad.  A lamina retractor was placed.  Rongeurs, osteotomes, and curettes were used  to remove the articular cartilage from the subtalar joint.  The patient had  multiple pieces of cartilage and cystic changes that were extensively  debrided.  This was debrided back to good, healthy, viable subchondral bone  with good bleeding.  The wound was irrigated with normal saline.  A stab  incision was made, then just medial to the anterior tibial tendon blunt  dissection was carried down to the talar neck.  A guidewire was inserted  across the talar neck into the os calcis.  C-arm fluoroscopy was used to  verify the reduction.  This measured 75 mg.  This was then drilled distally  and proximally for the medial screw.  The proximal cortex was tapped.  The  screw was advanced and once the screw was completely engaged, the proximal  screw head was then advanced across the screw to provide additional  compression.  The wound again  was irrigated with normal saline.  The  subchondral joint was then filled with allograft and Grafton.  This was  compressed with a bone tamp.  The superficial layer was then again  irrigated.  The muscle belly was then sutured over the graft with 2-0  Vicryl.  The  skin was closed with a vertical mattress of 2-0 nylon.  The wound was  covered with Adaptic, orthopedic sponges, and a compressive [name]  dressing was applied.  The tourniquet was then deflated after 88 minutes.  The patient was extubated and taken to the PACU in stable condition.                                               Nadara Mustard, M.D.    MVD/MEDQ  D:  02/03/2002  T:  02/03/2002  Job:  [id number]

## 2010-09-12 NOTE — Op Note (Signed)
NAME:  Jack [name], Jack [name] NO.:  [id number]   MEDICAL RECORD NO.:  [id number]                   PATIENT TYPE:  INP   LOCATION:  2899                                 FACILITY:  [facility]   PHYSICIAN:  Nadara Mustard, M.D.                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   OPERATION/PROCEDURE:  1.  Removal of deep buried screw.  2.  Tibial calcaneal fusion.   SURGEON:  Nadara Mustard, M.D.   ANESTHESIA:  Popliteal block plus LMA.   ESTIMATED BLOOD LOSS:  Minimal.   ANTIBIOTICS:  1 g of Kefzol.   TOURNIQUET TIME:  Esmarch vehicle for approximately 60 minutes.   DISPOSITION:  To the PACU in stable condition with a compressive [name] dressing.   INDICATIONS FOR PROCEDURE:  The patient is a 65 year old gentleman who is  status post right ankle arthroscopy in May 2005 with debridement of  osteochondral defect and degenerative arthritis of the ankle joint.  He is  status post subtalar fusion in October 2003, and still has persistent ankle  pain.  Radiographs show what appears to be a good bony ease of the subtalar  fusion; however, he has persistent symptoms with ambulation with activities  of daily living, pain with range of motion of the ankle.  Has had no relief  with conservative measures including injections and arthroscopy and presents  at this time for tibial calcaneal fusion.  The risks and benefits were  discussed including infection, neurovascular injury, persistent pain,  nonhealing of the wound, need for additional surgery.  The patient states he  understands and wishes to proceed at this time.   DESCRIPTION OF PROCEDURE:  The patient was brought to OR #4 after undergoing  a popliteal block.  The patient underwent an  LMA anesthetic.  After adequate  level of anesthesia was obtained, the patient's was prepped using a DuraPrep  and draped into a sterile field with Ioban used to cover all exposed skin.  Attention was first focused on the retained __________  screw.  A previous  incision dorsally just medial to the anterior tibial tendon was used and  blunt dissection was carried down to the screw.  The guidewire was placed in  the screw and the screw was removed.  The screw head which was a two-piece  system remained in the bone and this was not able to be removed without  significant soft tissue or bony damage and this was left in place.  The  wound was irrigated.  Subcutaneous tissue was closed using 2-0 Vicryl and  the skin was closed using proximate staples.   Attention was then focused laterally.  A Esmarch  was wrapped around the  ankle for tourniquet control.  A lateral incision was made over the fibula  consistent with an incision for ankle fracture.  This was carried sharply  down to bone using subperiosteal dissection.  The distal fibula was excised.  This was performed using an oscillating saw.  The bony fragments from the  previous fusion were removed and there were small areas of fibrous nonunion  which were debrided back to bleeding viable bone.  Small ostial exostosis  were removed as well.  After moving of the fibula, the ankle was placed in  90 degrees of dorsiflexion with neutral inversion and eversion and the bony  cuts were made off the distal tibia and talus perpendicular to the axis of  the tibia.  The bone fragments were removed, the wounds irrigated with  normal saline.  There was good cancellous bone bleeding on the tibia and  talus and these were placed with the foot at 90 degrees of dorsiflexion and  neutral inversion and eversion.  A blocking humeral plate was used and this  was secured with four screws locking proximally and four screws locking  distally.  The wound was again  irrigated and C-arm fluoroscopy verified  reduction in both AP and lateral planes.  The subcutaneous tissue was closed  using 2-0 Vicryl.  The skin was closed using Proximate staples.  The  tourniquet was deflated after 60 minutes and hemostasis was obtained.  The  patient was placed in a [name] dressing with Adaptic, 4 x 4s, Webril  and a compressive dressing applied.  The patient was extubated and taken to  the PACU in stable condition.   The plan is for 23-hour observation and plan for discharge tomorrow.                                               Nadara Mustard, M.D.    MVD/MEDQ  D:  12/27/2003  T:  12/27/2003  Job:  [id number]

## 2010-09-29 ENCOUNTER — Other Ambulatory Visit: Payer: Self-pay | Admitting: Internal Medicine

## 2010-12-30 ENCOUNTER — Other Ambulatory Visit: Payer: Self-pay | Admitting: Internal Medicine

## 2011-01-23 ENCOUNTER — Other Ambulatory Visit: Payer: Self-pay | Admitting: Internal Medicine

## 2011-02-24 ENCOUNTER — Other Ambulatory Visit: Payer: Self-pay | Admitting: Internal Medicine

## 2011-03-20 ENCOUNTER — Other Ambulatory Visit: Payer: Self-pay | Admitting: Internal Medicine

## 2011-04-27 ENCOUNTER — Encounter: Payer: Self-pay | Admitting: Internal Medicine

## 2011-04-28 DIAGNOSIS — M1990 Unspecified osteoarthritis, unspecified site: Secondary | ICD-10-CM

## 2011-04-28 HISTORY — DX: Unspecified osteoarthritis, unspecified site: M19.90

## 2011-04-29 ENCOUNTER — Other Ambulatory Visit: Payer: Self-pay | Admitting: *Deleted

## 2011-04-29 MED ORDER — LISINOPRIL-HYDROCHLOROTHIAZIDE 20-12.5 MG PO TABS: 1.0000 | ORAL_TABLET | Freq: Every day | ORAL | Status: DC

## 2011-05-12 ENCOUNTER — Ambulatory Visit (INDEPENDENT_AMBULATORY_CARE_PROVIDER_SITE_OTHER): Payer: Medicare Other | Admitting: Internal Medicine

## 2011-05-12 ENCOUNTER — Other Ambulatory Visit (INDEPENDENT_AMBULATORY_CARE_PROVIDER_SITE_OTHER): Payer: Medicare Other

## 2011-05-12 DIAGNOSIS — R972 Elevated prostate specific antigen [PSA]: Secondary | ICD-10-CM

## 2011-05-12 DIAGNOSIS — M79609 Pain in unspecified limb: Secondary | ICD-10-CM

## 2011-05-12 DIAGNOSIS — I10 Essential (primary) hypertension: Secondary | ICD-10-CM

## 2011-05-12 DIAGNOSIS — Z0000 Encounter for general adult medical examination without abnormal findings: Secondary | ICD-10-CM

## 2011-05-12 DIAGNOSIS — E785 Hyperlipidemia, unspecified: Secondary | ICD-10-CM

## 2011-05-12 LAB — LIPID PANEL
Cholesterol: 159 mg/dL (ref 0–200)
HDL: 48.6 mg/dL
LDL Cholesterol: 81 mg/dL (ref 0–99)
Total CHOL/HDL Ratio: 3
Triglycerides: 148 mg/dL (ref 0.0–149.0)
VLDL: 29.6 mg/dL (ref 0.0–40.0)

## 2011-05-12 LAB — HEPATIC FUNCTION PANEL
ALT: 21 U/L (ref 0–53)
AST: 31 U/L (ref 0–37)
Albumin: 4.2 g/dL (ref 3.5–5.2)
Alkaline Phosphatase: 68 U/L (ref 39–117)
Bilirubin, Direct: 0.1 mg/dL (ref 0.0–0.3)
Total Bilirubin: 0.7 mg/dL (ref 0.3–1.2)
Total Protein: 7.4 g/dL (ref 6.0–8.3)

## 2011-05-12 LAB — CBC WITH DIFFERENTIAL/PLATELET
Basophils Absolute: 0 10*3/uL (ref 0.0–0.1)
Basophils Relative: 0.5 % (ref 0.0–3.0)
Eosinophils Relative: 0.2 % (ref 0.0–5.0)
HCT: 42 % (ref 39.0–52.0)
Hemoglobin: 14.5 g/dL (ref 13.0–17.0)
Lymphs Abs: 1.6 10*3/uL (ref 0.7–4.0)
MCHC: 34.6 g/dL (ref 30.0–36.0)
Monocytes Absolute: 0.6 10*3/uL (ref 0.1–1.0)
Monocytes Relative: 7.6 % (ref 3.0–12.0)
Neutro Abs: 5.7 10*3/uL (ref 1.4–7.7)
Platelets: 216 10*3/uL (ref 150.0–400.0)
WBC: 8 10*3/uL (ref 4.5–10.5)

## 2011-05-12 LAB — COMPREHENSIVE METABOLIC PANEL
AST: 31 U/L (ref 0–37)
CO2: 25 mEq/L (ref 19–32)
Chloride: 107 mEq/L (ref 96–112)
Creatinine, Ser: 1.3 mg/dL (ref 0.4–1.5)
Glucose, Bld: 123 mg/dL — ABNORMAL HIGH (ref 70–99)
Sodium: 139 mEq/L (ref 135–145)
Total Protein: 7.4 g/dL (ref 6.0–8.3)

## 2011-05-12 NOTE — Patient Instructions (Signed)
Normal exam - you are doing well. Come back next year, sooner if needed.

## 2011-05-12 NOTE — Progress Notes (Signed)
Subjective:    Patient ID: Jack [name], male    DOB: 01/22/1946, 66 y.o.   MRN: [id number]  HPI The patient is here for annual Medicare wellness examination and management of other chronic and acute problems. He is doing well with the single c/o stiff back in the AM, responds to exercise and hot shower.   The risk factors are reflected in the social history.  The roster of all physicians providing medical care to patient - is listed in the Snapshot section of the chart.  Activities of daily living:  The patient is 100% inedpendent in all ADLs: dressing, toileting, feeding as well as independent mobility  Home safety : The patient has smoke detectors in the home. Falls - has made the home environment fall-safe. They wear seatbelts. firearms are present in the home, kept in a safe fashion. There is no violence in the home.   There is no risks for hepatitis, STDs or HIV. There is no   history of blood transfusion. They have no travel history to infectious disease endemic areas of the world.  The patient has seen their dentist in the last six month. They have seen their eye doctor in the last year. They deny any hearing difficulty and have not had audiologic testing in the last year.  They do not  have excessive sun exposure. Discussed the need for sun protection: hats, long sleeves and use of sunscreen if there is significant sun exposure.   Diet: the importance of a healthy diet is discussed. They do have a healthy diet.  The patient has no regular exercise program.  The benefits of regular aerobic exercise were discussed.  Depression screen: there are no signs or vegative symptoms of depression- irritability, change in appetite, anhedonia, sadness/tearfullness.  Cognitive assessment: the patient manages all their financial and personal affairs and is actively engaged.  recalled 3/3 objects at 3 minutes; performed clock-face test normally but labored.  The following portions of the  patient's history were reviewed and updated as appropriate: allergies, current medications, past family history, past medical history,  past surgical history, past social history  and problem list.  Vision, hearing, body mass index were assessed and reviewed.   During the course of the visit the patient was educated and counseled about appropriate screening and preventive services including : fall prevention , diabetes screening, nutrition counseling, colorectal cancer screening, and recommended immunizations.    Review of Systems Constitutional:  Negative for fever, chills, activity change and unexpected weight change.  HEENT:  Negative for hearing loss, ear pain, congestion, neck stiffness and postnasal drip. Negative for sore throat or swallowing problems. Negative for dental complaints.   Eyes: Negative for vision loss or change in visual acuity.  Respiratory: Negative for chest tightness and wheezing. Negative for DOE.   Cardiovascular: Negative for chest pain or palpitations. No decreased exercise tolerance Gastrointestinal: No change in bowel habit. No bloating or gas. No reflux or indigestion Genitourinary: Negative for urgency, frequency, flank pain and difficulty urinating.  Musculoskeletal: Negative for myalgias, back pain, and gait problem. has right knee pain, ankle pain at site of ORIF Neurological: Negative for dizziness, tremors, weakness and headaches.  Hematological: Negative for adenopathy.  Psychiatric/Behavioral: Negative for behavioral problems and dysphoric mood.       Objective:   Physical Exam Filed Vitals:   05/12/11 0902  BP: 136/84  Pulse: 106  Temp: 98.3 F (36.8 C)   Gen'l: Well nourished well developed AA male in no acute  distress  HEENT: Head: Normocephalic and atraumatic. Right Ear: External ear normal. EAC/TM nl. Left Ear: External ear normal.  EAC/TM nl. Nose: Nose normal. Mouth/Throat: Oropharynx is clear and moist. Dentition - full upper denture,  partial lower denture. No buccal or palatal lesions. Posterior pharynx clear. Eyes: Conjunctivae and sclera clear. EOM intact. Pupils are equal, round, and reactive to light. Right eye exhibits no discharge. Left eye exhibits no discharge. Neck: Normal range of motion. Neck supple. No JVD present. No tracheal deviation present. No thyromegaly present.  Cardiovascular: Normal rate, regular rhythm, no gallop, no friction rub, no murmur heard.      Quiet precordium. 2+ radial and DP pulses . No carotid bruits Pulmonary/Chest: Effort normal. No respiratory distress or increased WOB, no wheezes, no rales. No chest wall deformity or CVAT. Abdominal: Soft. Bowel sounds are normal in all quadrants. He exhibits no distension, no tenderness, no rebound or guarding, No heptosplenomegaly  Genitourinary:  deferred to GU Musculoskeletal: Normal range of motion. He exhibits no edema and no tenderness.       Small and large joints without redness, synovial thickening or deformity. Full range of motion preserved about all small, median and large joints.  Lymphadenopathy:    He has no cervical or supraclavicular adenopathy.  Neurological: He is alert and oriented to person, place, and time. CN II-XII intact. DTRs 2+ and symmetrical biceps, radial and patellar tendons. Cerebellar function normal with no tremor, rigidity, normal gait and station.  Skin: Skin is warm and dry. No rash noted. No erythema.  Psychiatric: He has a normal mood and affect. His behavior is normal. Thought content normal.   Lab Results  Component Value Date   WBC 8.0 05/12/2011   HGB 14.5 05/12/2011   HCT 42.0 05/12/2011   PLT 216.0 05/12/2011   GLUCOSE 123* 05/12/2011   CHOL 159 05/12/2011   TRIG 148.0 05/12/2011   HDL 48.60 05/12/2011   LDLDIRECT 118.3 02/10/2007   LDLCALC 81 05/12/2011   ALT 21 05/12/2011   ALT 21 05/12/2011   AST 31 05/12/2011   AST 31 05/12/2011   NA 139 05/12/2011   K 3.9 05/12/2011   CL 107 05/12/2011   CREATININE 1.3  05/12/2011   BUN 21 05/12/2011   CO2 25 05/12/2011   TSH 0.30* 05/09/2010   PSA 3.70 05/12/2011             Assessment & Plan:

## 2011-05-14 DIAGNOSIS — Z0001 Encounter for general adult medical examination with abnormal findings: Secondary | ICD-10-CM | POA: Insufficient documentation

## 2011-05-14 NOTE — Assessment & Plan Note (Signed)
Lab Results  Component Value Date   CHOL 159 05/12/2011   HDL 48.60 05/12/2011   LDLCALC 81 05/12/2011   LDLDIRECT 118.3 02/10/2007   TRIG 148.0 05/12/2011   CHOLHDL 3 05/12/2011   Good control on present medication

## 2011-05-14 NOTE — Assessment & Plan Note (Signed)
PSA at this visit 3.70 normal range.

## 2011-05-14 NOTE — Assessment & Plan Note (Signed)
Interval medical history is unremarkable. Physical exam is normal. Lab results are in normal range. He is current with colorectal cancer screening and prostate screening. Immunizations are current except he is a candidate for shingles vaccine.   IN summary- a nice man who is medically stable. He will continue his present medications. He will return in 1 year or as needed.

## 2011-05-14 NOTE — Assessment & Plan Note (Signed)
BP Readings from Last 3 Encounters:  05/12/11 136/84  05/09/10 120/72  03/25/09 118/80   Adequate control on present regimen

## 2011-05-15 ENCOUNTER — Other Ambulatory Visit: Payer: Self-pay | Admitting: Internal Medicine

## 2011-05-17 ENCOUNTER — Encounter: Payer: Self-pay | Admitting: Internal Medicine

## 2011-07-19 ENCOUNTER — Other Ambulatory Visit: Payer: Self-pay | Admitting: Internal Medicine

## 2011-07-21 NOTE — Telephone Encounter (Signed)
Request for OTC brand [[facility] zyrtec-D 5-120 [last prescription strength refill on this Rx was 03/25/2009. Please advise,

## 2011-07-23 ENCOUNTER — Telehealth: Payer: Self-pay | Admitting: Internal Medicine

## 2011-07-23 NOTE — Telephone Encounter (Signed)
Note made in error

## 2011-07-23 NOTE — Telephone Encounter (Deleted)
Request refill on zy

## 2011-07-24 ENCOUNTER — Other Ambulatory Visit: Payer: Self-pay | Admitting: Internal Medicine

## 2011-07-27 NOTE — Telephone Encounter (Signed)
Last refill 03.24.13 #[id number] DENIED

## 2011-08-21 ENCOUNTER — Other Ambulatory Visit: Payer: Self-pay | Admitting: Internal Medicine

## 2011-11-10 ENCOUNTER — Other Ambulatory Visit: Payer: Self-pay | Admitting: Internal Medicine

## 2011-12-29 ENCOUNTER — Encounter: Payer: Self-pay | Admitting: Internal Medicine

## 2012-05-01 ENCOUNTER — Other Ambulatory Visit: Payer: Self-pay | Admitting: Internal Medicine

## 2012-06-07 ENCOUNTER — Ambulatory Visit (INDEPENDENT_AMBULATORY_CARE_PROVIDER_SITE_OTHER): Payer: Medicare Other | Admitting: Internal Medicine

## 2012-06-07 ENCOUNTER — Other Ambulatory Visit (INDEPENDENT_AMBULATORY_CARE_PROVIDER_SITE_OTHER): Payer: Medicare Other

## 2012-06-07 ENCOUNTER — Encounter: Payer: Self-pay | Admitting: Internal Medicine

## 2012-06-07 VITALS — BP 124/70 | HR 110 | Temp 97.6°F | Resp 10 | Ht 66.0 in | Wt 196.0 lb

## 2012-06-07 DIAGNOSIS — E785 Hyperlipidemia, unspecified: Secondary | ICD-10-CM

## 2012-06-07 DIAGNOSIS — R972 Elevated prostate specific antigen [PSA]: Secondary | ICD-10-CM

## 2012-06-07 DIAGNOSIS — I10 Essential (primary) hypertension: Secondary | ICD-10-CM

## 2012-06-07 DIAGNOSIS — Z0000 Encounter for general adult medical examination without abnormal findings: Secondary | ICD-10-CM

## 2012-06-07 DIAGNOSIS — Z86010 Personal history of colonic polyps: Secondary | ICD-10-CM

## 2012-06-07 LAB — HEPATIC FUNCTION PANEL
ALT: 17 U/L (ref 0–53)
Alkaline Phosphatase: 64 U/L (ref 39–117)
Bilirubin, Direct: 0 mg/dL (ref 0.0–0.3)

## 2012-06-07 LAB — COMPREHENSIVE METABOLIC PANEL
AST: 27 U/L (ref 0–37)
Albumin: 4.1 g/dL (ref 3.5–5.2)
Creatinine, Ser: 1.3 mg/dL (ref 0.4–1.5)
GFR: 70.25 mL/min (ref >60.00)
Glucose, Bld: 101 mg/dL — ABNORMAL HIGH (ref 70–99)
Potassium: 3.9 mEq/L (ref 3.5–5.1)
Total Bilirubin: 0.5 mg/dL (ref 0.3–1.2)

## 2012-06-07 LAB — LIPID PANEL
Cholesterol: 154 mg/dL (ref 0–200)
Total CHOL/HDL Ratio: 4
VLDL: 58.8 mg/dL — ABNORMAL HIGH (ref 0.0–40.0)

## 2012-06-07 NOTE — Patient Instructions (Addendum)
Thanks for coming in.  Your exam is normal.  Will get lab today and the results will be in the report I send you.  Continue all you present medication - Blood pressure is good and we will check the cholesterol.   Stay out of the snow.

## 2012-06-07 NOTE — Progress Notes (Signed)
Subjective:    Patient ID: Jack [name], male    DOB: [birth year], 67 y.o.   MRN: [id number]  HPI The patient is here for annual Medicare wellness examination and management of other chronic and acute problems.  Saw Dr. Lajoyce Corners for back pain. Had course of steroids and then MRI spine followed by recommendation for ESI.    The risk factors are reflected in the social history.  The roster of all physicians providing medical care to patient - is listed in the Snapshot section of the chart.  Activities of daily living:  The patient is 100% inedpendent in all ADLs: dressing, toileting, feeding as well as independent mobility  Home safety : The patient has no  smoke detectors in the home. They wear seatbelts.firearms are present in the home, kept in a safe fashion) There is no violence in the home.   There is no risks for hepatitis, STDs or HIV. There is no   history of blood transfusion. They have no travel history to infectious disease endemic areas of the world.  The patient has not seen their dentist in the last six month. They have seen their eye doctor in the last year. They deny any hearing difficulty and have not had audiologic testing in the last year.    They do not  have excessive sun exposure. Discussed the need for sun protection: hats, long sleeves and use of sunscreen if there is significant sun exposure.   Diet: the importance of a healthy diet is discussed. They do have a healthy  diet.  The patient has no regular exercise program. The benefits of regular aerobic exercise were discussed.  Depression screen: there are no signs or vegative symptoms of depression- irritability, change in appetite, anhedonia, sadness/tearfullness.  Cognitive assessment: the patient manages all their financial and personal affairs and is actively engaged. They could relate day,date,year and events; recalled 3/3 objects at 3 minutes; performed clock-face test normally.  Past Medical History   Diagnosis Date  . ALLERGIC RHINITIS 02/16/2007  . BACK PAIN 02/16/2007  . FOOT PAIN, CHRONIC 02/27/2008  . 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 HYPERLIPIDEMIA 02/16/2007  . HYPERTENSION 02/16/2007  . PROSTATE SPECIFIC ANTIGEN, ELEVATED 02/16/2007  . 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 Foot surgery      reconstruction x's 4 after work accident   Family History  Problem Relation Age of Onset  . Cancer Mother     throat & lung  . Coronary artery disease Father   . Hypertension Father   . Cancer Sister   . Alcohol abuse Brother    History   Social History  . Marital Status: Married    Spouse Name: N/A    Number of Children: N/A  . Years of Education: N/A   Occupational History  . Not on file.   Social History Main Topics  . Smoking status: Never Smoker   . Smokeless tobacco: Never Used  . Alcohol Use: No  . Drug Use: No  . Sexually Active: Yes -- Male partner(s)   Other Topics Concern  . Not on file   Social History Narrative   11 grade education   Married 1968   2 son's='69, '70; 1 dtr '80; 5 grandsons   Retired on disability from foot injury. 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 finasteride (PROSCAR) 5 MG tablet Take 5 mg by mouth daily.        Marland Kitchen lisinopril-hydrochlorothiazide (PRINZIDE,ZESTORETIC) 20-12.5 MG per tablet TAKE 1 TABLET BY MOUTH EVERY DAY  30 tablet  2  . simvastatin (ZOCOR) 40 MG tablet TAKE 1 TABLET BY MOUTH EVERY EVENING  30 tablet  2  . clidinium-chlordiazePOXIDE (LIBRAX) 2.5-5 MG per capsule TAKE AS DIRECTED  30 capsule  1  . [facility] ALLERGY RELIEF-D 5-120 MG per tablet TAKE AS DIRECTED  60 tablet  1   No current facility-administered medications on file prior to visit.     The following portions of the patient's history were reviewed and updated as  appropriate: allergies, current medications, past family history, past medical history,  past surgical history, past social history  and problem list.  Past Medical History  Diagnosis Date  . ALLERGIC RHINITIS 02/16/2007  . BACK PAIN 02/16/2007  . FOOT PAIN, CHRONIC 02/27/2008  . 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 HYPERLIPIDEMIA 02/16/2007  . HYPERTENSION 02/16/2007  . PROSTATE SPECIFIC ANTIGEN, ELEVATED 02/16/2007  . 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 Foot surgery      reconstruction x's 4 after work accident   Family History  Problem Relation Age of Onset  . Cancer Mother     throat & lung  . Coronary artery disease Father   . Hypertension Father   . Cancer Sister   . Alcohol abuse Brother    History   Social History  . Marital Status: Married    Spouse Name: N/A    Number of Children: N/A  . Years of Education: N/A   Occupational History  . Not on file.   Social History Main Topics  . Smoking status: Never Smoker   . Smokeless tobacco: Never Used  . Alcohol Use: No  . Drug Use: No  . Sexually Active: Yes -- Male partner(s)   Other Topics Concern  . 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 finasteride (PROSCAR) 5 MG tablet Take 5 mg by mouth daily.        Marland Kitchen lisinopril-hydrochlorothiazide (PRINZIDE,ZESTORETIC) 20-12.5 MG per tablet TAKE 1 TABLET BY MOUTH EVERY DAY  30 tablet  2  . simvastatin (ZOCOR) 40 MG tablet TAKE 1 TABLET BY MOUTH EVERY EVENING  30 tablet  2  . clidinium-chlordiazePOXIDE (LIBRAX) 2.5-5 MG per capsule TAKE AS DIRECTED  30 capsule  1  . [facility] ALLERGY RELIEF-D 5-120 MG per tablet TAKE AS DIRECTED  60  tablet  1   No current facility-administered medications on file prior to visit.    Vision, hearing, body mass index were assessed and reviewed.   During the course of the visit the patient was educated and counseled about appropriate screening and preventive services including : fall prevention , diabetes screening, nutrition counseling, colorectal cancer screening, and recommended immunizations.    Review of Systems Constitutional:  Negative for fever, chills, activity change and unexpected weight change.  HEENT:  Negative for hearing loss, ear pain, congestion, neck  stiffness and postnasal drip. Negative for sore throat or swallowing problems. Negative for dental complaints.   Eyes: Negative for vision loss or change in visual acuity.  Respiratory: Negative for chest tightness and wheezing. Negative for DOE.   Cardiovascular: Negative for chest pain or palpitations. No decreased exercise tolerance Gastrointestinal: No change in bowel habit. No bloating or gas. No reflux or indigestion Genitourinary: Negative for urgency, frequency, flank pain and difficulty urinating.  Musculoskeletal: Negative for myalgias, back pain, arthralgias and gait problem.  Neurological: Negative for dizziness, tremors, weakness and headaches.  Hematological: Negative for adenopathy.  Psychiatric/Behavioral: Negative for behavioral problems and dysphoric mood.       Objective:   Physical Exam Filed Vitals:   06/07/12 1441  BP: 124/70  Pulse: 110  Temp: 97.6 F (36.4 C)  Resp: 10   Wt Readings from Last 3 Encounters:  06/07/12 196 lb 0.6 oz (88.923 kg)  05/12/11 206 lb (93.441 kg)  05/09/10 202 lb (91.627 kg)   Gen'l: Well nourished well developed AA male in no acute distress  HEENT: Head: Normocephalic and atraumatic. Right Ear: External ear normal. EAC/TM nl. Left Ear: External ear normal.  EAC/TM nl. Nose: Nose normal. Mouth/Throat: Oropharynx is clear and moist. Dentition - full upper denture,  lower partial. No buccal lesions. Posterior pharynx clear. Eyes: Conjunctivae and sclera clear. EOM intact. Pupils are equal, round, and reactive to light. Right eye exhibits no discharge. Left eye exhibits no discharge. Neck: Normal range of motion. Neck supple. No JVD present. No tracheal deviation present. No thyromegaly present.  Cardiovascular: Normal rate, regular rhythm, no gallop, no friction rub, no murmur heard.      Quiet precordium. 2+ radial and DP pulses . No carotid bruits Pulmonary/Chest: Effort normal. No respiratory distress or increased WOB, no wheezes, no rales. No chest wall deformity or CVAT. Abdomen: Soft. Bowel sounds are normal in all quadrants. He exhibits no distension, no tenderness, no rebound or guarding, No heptosplenomegaly  Genitourinary:  deferred to GU Musculoskeletal: Normal range of motion. He exhibits no edema and no tenderness.       Small and large joints without redness, synovial thickening or deformity. Full range of motion preserved about all small, median and large joints.  Lymphadenopathy:    He has no cervical or supraclavicular adenopathy.  Neurological: He is alert and oriented to person, place, and time. CN II-XII intact. DTRs 2+ and symmetrical biceps, radial and patellar tendons. Cerebellar function normal with no tremor, rigidity, normal gait and station.  Skin: Skin is warm and dry. No rash noted. No erythema.  Psychiatric: He has a normal mood and affect. His behavior is normal. Thought content normal.   Lab Results  Component Value Date   WBC 8.0 05/12/2011   HGB 14.5 05/12/2011   HCT 42.0 05/12/2011   PLT 216.0 05/12/2011   GLUCOSE 101* 06/07/2012   CHOL 154 06/07/2012   TRIG 294.0* 06/07/2012   HDL 43.10 06/07/2012   LDLDIRECT 75.2 06/07/2012   LDLCALC 81 05/12/2011        ALT 17 06/07/2012   AST 27 06/07/2012        NA 136 06/07/2012   K 3.9 06/07/2012   CL 103 06/07/2012   CREATININE 1.3 06/07/2012   BUN 15 06/07/2012   CO2 27 06/07/2012    TSH 0.30* 05/09/2010   PSA 3.70 05/12/2011         Assessment & Plan:

## 2012-06-08 ENCOUNTER — Telehealth: Payer: Self-pay | Admitting: *Deleted

## 2012-06-08 ENCOUNTER — Encounter: Payer: Self-pay | Admitting: Internal Medicine

## 2012-06-08 NOTE — Assessment & Plan Note (Signed)
BP Readings from Last 3 Encounters:  06/07/12 124/70  05/12/11 136/84  05/09/10 120/72   Very good control on present medication. Lab reveals normal renal function and electrolytes  Plan Continue present medication.

## 2012-06-08 NOTE — Assessment & Plan Note (Signed)
Interval history - he has been healthy with no major illness, surgery or injury. He is current with colorectal cancer screening. He is follow annually by Urology for elevated PSA - depressed by use of finasteride. Immunizations are current but he is a candidate for shingles vaccine.  In summary - a very nice man who appears to be medically stable. He will return in 1 year, sooner as needed.

## 2012-06-08 NOTE — Telephone Encounter (Signed)
PATIENT WIFE CALLED TO LET YOU KNOW THAT THE [facility] SHE [facility] STATES THEY DO NOT CARRY THE SHINGLES VACCINE. SHE STATES WILL CONTINUE TO CALL AND TRY TO GET  VACCINE AT OTHER PHARMACIES  CB#[phone].

## 2012-06-08 NOTE — Assessment & Plan Note (Signed)
Tolerating medication w/o difficulty. LDL is better than goal of 100 or less and HDL is better than goal of 40+.  Plan Continue present medication.

## 2012-06-08 NOTE — Assessment & Plan Note (Signed)
History of elevated PSA. He is followed by Dr. Mena Goes. He has a history of prostate biopsy negative x 3. Currently stable.

## 2012-06-21 ENCOUNTER — Encounter: Payer: Self-pay | Admitting: Internal Medicine

## 2012-06-21 ENCOUNTER — Encounter: Payer: Medicare Other | Admitting: Internal Medicine

## 2012-06-21 NOTE — Progress Notes (Signed)
This encounter was created in error - please disregard.

## 2012-09-13 ENCOUNTER — Other Ambulatory Visit: Payer: Self-pay | Admitting: Internal Medicine

## 2012-11-09 ENCOUNTER — Other Ambulatory Visit: Payer: Self-pay | Admitting: Internal Medicine

## 2013-03-02 ENCOUNTER — Other Ambulatory Visit: Payer: Self-pay | Admitting: Internal Medicine

## 2013-05-07 ENCOUNTER — Other Ambulatory Visit: Payer: Self-pay | Admitting: Internal Medicine

## 2013-07-25 ENCOUNTER — Encounter: Payer: Medicare Other | Admitting: Internal Medicine

## 2013-08-22 ENCOUNTER — Other Ambulatory Visit: Payer: Self-pay

## 2013-08-22 MED ORDER — SIMVASTATIN 40 MG PO TABS: 40.0000 mg | ORAL_TABLET | Freq: Every day | ORAL | Status: DC

## 2013-09-27 ENCOUNTER — Other Ambulatory Visit (INDEPENDENT_AMBULATORY_CARE_PROVIDER_SITE_OTHER): Payer: Commercial Managed Care - HMO

## 2013-09-27 ENCOUNTER — Ambulatory Visit (INDEPENDENT_AMBULATORY_CARE_PROVIDER_SITE_OTHER): Payer: Commercial Managed Care - HMO | Admitting: Internal Medicine

## 2013-09-27 ENCOUNTER — Encounter: Payer: Self-pay | Admitting: Internal Medicine

## 2013-09-27 ENCOUNTER — Other Ambulatory Visit: Payer: Self-pay | Admitting: Internal Medicine

## 2013-09-27 VITALS — BP 104/62 | HR 110 | Temp 98.1°F | Ht 66.0 in | Wt 192.0 lb

## 2013-09-27 DIAGNOSIS — Z0000 Encounter for general adult medical examination without abnormal findings: Secondary | ICD-10-CM

## 2013-09-27 DIAGNOSIS — Z23 Encounter for immunization: Secondary | ICD-10-CM

## 2013-09-27 DIAGNOSIS — Z136 Encounter for screening for cardiovascular disorders: Secondary | ICD-10-CM

## 2013-09-27 DIAGNOSIS — R7989 Other specified abnormal findings of blood chemistry: Secondary | ICD-10-CM

## 2013-09-27 DIAGNOSIS — K5790 Diverticulosis of intestine, part unspecified, without perforation or abscess without bleeding: Secondary | ICD-10-CM

## 2013-09-27 HISTORY — DX: Diverticulosis of intestine, part unspecified, without perforation or abscess without bleeding: K57.90

## 2013-09-27 LAB — BASIC METABOLIC PANEL
BUN: 16 mg/dL (ref 6–23)
CHLORIDE: 103 meq/L (ref 96–112)
CO2: 27 mEq/L (ref 19–32)
Calcium: 9.4 mg/dL (ref 8.4–10.5)
Creatinine, Ser: 1.2 mg/dL (ref 0.4–1.5)
GFR: 77.43 mL/min (ref >60.00)
GLUCOSE: 98 mg/dL (ref 70–99)
Potassium: 3.6 mEq/L (ref 3.5–5.1)
Sodium: 136 mEq/L (ref 135–145)

## 2013-09-27 LAB — CBC WITH DIFFERENTIAL/PLATELET
BASOS PCT: 0.3 % (ref 0.0–3.0)
Basophils Absolute: 0 10*3/uL (ref 0.0–0.1)
EOS PCT: 0.7 % (ref 0.0–5.0)
Eosinophils Absolute: 0.1 10*3/uL (ref 0.0–0.7)
HCT: 42.2 % (ref 39.0–52.0)
Hemoglobin: 14.1 g/dL (ref 13.0–17.0)
LYMPHS PCT: 27.6 % (ref 12.0–46.0)
Lymphs Abs: 2.9 10*3/uL (ref 0.7–4.0)
MCHC: 33.4 g/dL (ref 30.0–36.0)
MCV: 96.8 fl (ref 78.0–100.0)
MONO ABS: 0.9 10*3/uL (ref 0.1–1.0)
MONOS PCT: 8.6 % (ref 3.0–12.0)
NEUTROS PCT: 62.8 % (ref 43.0–77.0)
Neutro Abs: 6.6 10*3/uL (ref 1.4–7.7)
Platelets: 229 10*3/uL (ref 150.0–400.0)
RBC: 4.36 Mil/uL (ref 4.22–5.81)
RDW: 12.4 % (ref 11.5–15.5)
WBC: 10.5 10*3/uL (ref 4.0–10.5)

## 2013-09-27 LAB — HEPATIC FUNCTION PANEL
ALBUMIN: 4 g/dL (ref 3.5–5.2)
ALT: 11 U/L (ref 0–53)
AST: 21 U/L (ref 0–37)
Alkaline Phosphatase: 66 U/L (ref 39–117)
BILIRUBIN TOTAL: 0.5 mg/dL (ref 0.2–1.2)
Bilirubin, Direct: 0.1 mg/dL (ref 0.0–0.3)
Total Protein: 7.3 g/dL (ref 6.0–8.3)

## 2013-09-27 LAB — LIPID PANEL
CHOL/HDL RATIO: 3
Cholesterol: 155 mg/dL (ref 0–200)
HDL: 46.2 mg/dL (ref >39.00)
LDL Cholesterol: 73 mg/dL (ref 0–99)
NonHDL: 108.8
Triglycerides: 179 mg/dL — ABNORMAL HIGH (ref 0.0–149.0)
VLDL: 35.8 mg/dL (ref 0.0–40.0)

## 2013-09-27 LAB — URINALYSIS, ROUTINE W REFLEX MICROSCOPIC
Bilirubin Urine: NEGATIVE
HGB URINE DIPSTICK: NEGATIVE
Ketones, ur: NEGATIVE
Nitrite: NEGATIVE
RBC / HPF: NONE SEEN (ref 0–?)
Specific Gravity, Urine: <=1.005 — AB (ref 1.000–1.030)
TOTAL PROTEIN, URINE-UPE24: NEGATIVE
URINE GLUCOSE: NEGATIVE
Urobilinogen, UA: 0.2 (ref 0.0–1.0)
pH: 6 (ref 5.0–8.0)

## 2013-09-27 LAB — TSH: TSH: 0.24 u[IU]/mL — AB (ref 0.35–4.50)

## 2013-09-27 MED ORDER — LISINOPRIL-HYDROCHLOROTHIAZIDE 20-12.5 MG PO TABS: 1.0000 | ORAL_TABLET | Freq: Every day | ORAL | Status: DC

## 2013-09-27 MED ORDER — SIMVASTATIN 40 MG PO TABS: 40.0000 mg | ORAL_TABLET | Freq: Every day | ORAL | Status: DC

## 2013-09-27 MED ORDER — FINASTERIDE 5 MG PO TABS: 5.0000 mg | ORAL_TABLET | Freq: Every day | ORAL | Status: DC

## 2013-09-27 NOTE — Progress Notes (Signed)
Subjective:    Patient ID: Jack [name], male    DOB: 09/30/1945, 68 y.o.   MRN: [id number]  HPI  Here for wellness and f/u;  Overall doing ok;  Pt denies CP, worsening SOB, DOE, wheezing, orthopnea, PND, worsening LE edema, palpitations, dizziness or syncope.  Pt denies neurological change such as new headache, facial or extremity weakness.  Pt denies polydipsia, polyuria, or low sugar symptoms. Pt states overall good compliance with treatment and medications, good tolerability, and has been trying to follow lower cholesterol diet.  Pt denies worsening depressive symptoms, suicidal ideation or panic. No fever, night sweats, wt loss, loss of appetite, or other constitutional symptoms.  Pt states good ability with ADL's, has low fall risk, home safety reviewed and adequate, no other significant changes in hearing or vision, and only occasionally active with exercise. To see Dr Enid Derry.urology soon with f/u psa. Pt continues to have recurring right LBP without change in severity, bowel or bladder change, fever, wt loss,  worsening LE pain/numbness/weakness, gait change or falls. Past Medical History  Diagnosis Date  . ALLERGIC RHINITIS 02/16/2007  . BACK PAIN 02/16/2007  . FOOT PAIN, CHRONIC 02/27/2008  . 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 HYPERLIPIDEMIA 02/16/2007  . HYPERTENSION 02/16/2007  . PROSTATE SPECIFIC ANTIGEN, ELEVATED 02/16/2007  . 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 Diverticulosis 09/27/2013   Past Surgical History  Procedure Laterality Date  . Foot surgery      reconstruction x's 4 after work accident    reports that he has never smoked. He has never used smokeless tobacco. He reports that he does not drink alcohol or use illicit drugs. family history includes Alcohol abuse in his brother; Cancer in his mother and sister; Coronary artery disease in his father; Hypertension in his father. No Known Allergies Current Outpatient Prescriptions on File Prior to Visit  Medication Sig Dispense  Refill  . simvastatin (ZOCOR) 40 MG tablet TAKE 1 TABLET BY MOUTH EVERY EVENING  30 tablet  2  . clidinium-chlordiazePOXIDE (LIBRAX) 2.5-5 MG per capsule TAKE AS DIRECTED  30 capsule  1   No current facility-administered medications on file prior to visit.   Review of Systems Constitutional: Negative for increased diaphoresis, other activity, appetite or other siginficant weight change  HENT: Negative for worsening hearing loss, ear pain, facial swelling, mouth sores and neck stiffness.   Eyes: Negative for other worsening pain, redness or visual disturbance.  Respiratory: Negative for shortness of breath and wheezing.   Cardiovascular: Negative for chest pain and palpitations.  Gastrointestinal: Negative for diarrhea, blood in stool, abdominal distention or other pain Genitourinary: Negative for hematuria, flank pain or change in urine volume.  Musculoskeletal: Negative for myalgias or other joint complaints.  Skin: Negative for color change and wound.  Neurological: Negative for syncope and numbness. other than noted Hematological: Negative for adenopathy. or other swelling Psychiatric/Behavioral: Negative for hallucinations, self-injury, decreased concentration or other worsening agitation.      Objective:   Physical Exam BP 104/62  Pulse 110  Temp(Src) 98.1 F (36.7 C) (Oral)  Ht 5\' 6"  (1.676 m)  Wt 192 lb (87.091 kg)  BMI 31.00 kg/m2  SpO2 98% VS noted,  Constitutional: Pt is oriented to person, place, and time. Appears well-developed and well-nourished.  Head: Normocephalic and atraumatic.  Right Ear: External ear normal.  Left Ear: External ear normal.  Nose: Nose normal.  Mouth/Throat: Oropharynx is clear and moist.  Eyes: Conjunctivae and EOM are normal.  Pupils are equal, round, and reactive to light.  Neck: Normal range of motion. Neck supple. No JVD present. No tracheal deviation present.  Cardiovascular: Normal rate, regular rhythm, normal heart sounds and intact  distal pulses.   Pulmonary/Chest: Effort normal and breath sounds without rales or wheezing  Abdominal: Soft. Bowel sounds are normal. NT. No HSM  Musculoskeletal: Normal range of motion. Exhibits no edema.  Lymphadenopathy:  Has no cervical adenopathy.  Neurological: Pt is alert and oriented to person, place, and time. Pt has normal reflexes. No cranial nerve deficit. Motor grossly intact Skin: Skin is warm and dry. No rash noted.  Psychiatric:  Has normal mood and affect. Behavior is normal.     Assessment & Plan:

## 2013-09-27 NOTE — Patient Instructions (Addendum)
You had the new Prevnar pneumonia shot today  We can plan on the older "pneumovax' to be done next year  Please continue all other medications as before, and refills have been done if requested.  Please have the pharmacy call with any other refills you may need.  Please continue your efforts at being more active, low cholesterol diet, and weight control.  You are otherwise up to date with prevention measures today.  Please keep your appointments with your specialists as you have planned - urology  Please go to the LAB in the Basement (turn left off the elevator) for the tests to be done today  You will be contacted by phone if any changes need to be made immediately.  Otherwise, you will receive a letter about your results with an explanation, but please check with MyChart first.  Please return in 1 year for your yearly visit, or sooner if needed, with Lab testing done 3-5 days before

## 2013-09-27 NOTE — Progress Notes (Signed)
Pre visit review using our clinic review tool, if applicable. No additional management support is needed unless otherwise documented below in the visit note. 

## 2013-09-27 NOTE — Assessment & Plan Note (Addendum)

## 2013-09-27 NOTE — Addendum Note (Signed)
Addended by: Sharon Seller B on: 09/27/2013 01:51 PM   Modules accepted: Orders

## 2013-10-05 ENCOUNTER — Encounter: Payer: Self-pay | Admitting: Internal Medicine

## 2013-10-05 ENCOUNTER — Ambulatory Visit (INDEPENDENT_AMBULATORY_CARE_PROVIDER_SITE_OTHER): Payer: Commercial Managed Care - HMO | Admitting: Internal Medicine

## 2013-10-05 VITALS — BP 112/64 | HR 105 | Temp 97.6°F | Resp 12 | Ht 66.0 in | Wt 189.0 lb

## 2013-10-05 DIAGNOSIS — R946 Abnormal results of thyroid function studies: Secondary | ICD-10-CM

## 2013-10-05 DIAGNOSIS — R7989 Other specified abnormal findings of blood chemistry: Secondary | ICD-10-CM

## 2013-10-05 LAB — T3, FREE: T3, Free: 2.9 pg/mL (ref 2.3–4.2)

## 2013-10-05 LAB — TSH: TSH: 0.24 u[IU]/mL — AB (ref 0.35–4.50)

## 2013-10-05 LAB — T4, FREE: Free T4: 0.84 ng/dL (ref 0.60–1.60)

## 2013-10-05 MED ORDER — ATENOLOL 25 MG PO TABS: 25.0000 mg | ORAL_TABLET | Freq: Every day | ORAL | Status: DC

## 2013-10-05 NOTE — Progress Notes (Signed)
Patient ID: Jack [name], male   DOB: 02/13/1946, 68 y.o.   MRN: [id number]   HPI  Jack HRDLICKA is a 68 y.o.-year-old male, referred by his PCP, Dr. Jenny [name], in consultation for low TSH levels.  I reviewed pt's thyroid tests: Lab Results  Component Value Date   TSH 0.24* 09/27/2013   TSH 0.30* 05/09/2010   TSH 0.41 02/27/2008   TSH 0.33* 02/10/2007    Pt denies feeling nodules in neck, hoarseness, dysphagia/odynophagia, SOB with lying down.  He has no complaints: - no  excessive sweating/heat intolerance - no tremors - no anxiety - no palpitations - no problems with concentration - no fatigue - no hyperdefecation - no weight loss  Pt does not have a FH of thyroid ds. No FH of thyroid cancer. No h/o radiation tx to head or neck.  No seaweed or kelp, no recent contrast studies. No steroid use. No herbal supplements.    ROS: Constitutional: no weight gain/loss, no fatigue, no subjective hyperthermia/hypothermia Eyes: no blurry vision, no xerophthalmia ENT: no sore throat, no nodules palpated in throat, no dysphagia/odynophagia, no hoarseness, + decreased hearing Cardiovascular: no CP/SOB/palpitations/leg swelling Respiratory: no cough/SOB Gastrointestinal: no N/V/D/C Musculoskeletal: no muscle/joint aches Skin: no rashes Neurological: no tremors/numbness/tingling/dizziness Psychiatric: no depression/anxiety  Past Medical History  Diagnosis Date  . ALLERGIC RHINITIS 02/16/2007  . BACK PAIN 02/16/2007  . FOOT PAIN, CHRONIC 02/27/2008  . 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 HYPERLIPIDEMIA 02/16/2007  . HYPERTENSION 02/16/2007  . PROSTATE SPECIFIC ANTIGEN, ELEVATED 02/16/2007  . 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 Diverticulosis 09/27/2013   Past Surgical History  Procedure Laterality Date  . Foot surgery      reconstruction x's 4 after work accident   History   Social History  . Marital Status: Married    Spouse Name: N/A    Number of Children: 3   Occupational History  .  retired   Social History Main Topics  . Smoking status: Never Smoker   . Smokeless tobacco: Never Used  . Alcohol Use: No  . Drug Use: No  . Sexual Activity: Yes    Partners: Female   Social History Narrative   11 grade education   Married 1968   2 son's='69, '70; 1 dtr '80; 5 grandsons   Retired on disability from foot injury. Had worked as a Ecologist   Marriage in good health-wife smokes 3 pks day   Current Outpatient Prescriptions on File Prior to Visit  Medication Sig Dispense Refill  . clidinium-chlordiazePOXIDE (LIBRAX) 2.5-5 MG per capsule TAKE AS DIRECTED  30 capsule  1  . finasteride (PROSCAR) 5 MG tablet Take 1 tablet (5 mg total) by mouth daily.  30 tablet  11  . lisinopril-hydrochlorothiazide (PRINZIDE,ZESTORETIC) 20-12.5 MG per tablet Take 1 tablet by mouth daily.  30 tablet  11  . simvastatin (ZOCOR) 40 MG tablet TAKE 1 TABLET BY MOUTH EVERY EVENING  30 tablet  2  . simvastatin (ZOCOR) 40 MG tablet Take 1 tablet (40 mg total) by mouth daily at 6 PM.  30 tablet  11   No current facility-administered medications on file prior to visit.   No Known Allergies Family History  Problem Relation Age of Onset  . Cancer Mother     throat & lung  . Coronary artery disease Father   . Hypertension Father   . Cancer Sister   . Alcohol abuse Brother    PE: BP 112/64  Pulse 105  Temp(Src) 97.6 F (36.4 C) (Oral)  Resp 12  Ht 5\' 6"  (1.676 m)  Wt 189 lb (85.73 kg)  BMI 30.52 kg/m2  SpO2 96% Wt Readings from Last 3 Encounters:  10/05/13 189 lb (85.73 kg)  09/27/13 192 lb (87.091 kg)  06/07/12 196 lb 0.6 oz (88.923 kg)   Constitutional: overweight, in NAD Eyes: PERRLA, EOMI, no exophthalmos, no lid lag, no stare ENT: moist mucous membranes, no thyromegaly, no thyroid bruits, no cervical lymphadenopathy Cardiovascular: tachycardia, RRR, No MRG Respiratory: CTA B Gastrointestinal: abdomen soft, NT, ND, BS+ Musculoskeletal: no deformities, strength intact in  all 4 Skin: moist, warm, no rashes Neurological: + tremor with outstretched hands, DTR normal in all 4  ASSESSMENT: 1. Low TSH  PLAN:  1. Patient with a low or low normal TSH tracing back to 2008, with some possible thyrotoxic signs/sxs: weight loss,  Tachycardia, tremor - he does not appear to have exogenous causes for the low TSH.  - We discussed that possible causes of thyrotoxicosis are:  Graves ds   Thyroiditis toxic multinodular goiter/ toxic adenoma (I cannot feel nodules at palpation of his thyroid). - I suggested that we check the TSH, fT3 and fT4 and also had thyroid stimulating antibodies to screen for Graves' disease.  - If the tests remain abnormal, we may needs to obtain an uptake and scan to differentiate between the 3 above possible etiologies  - we discussed about possible modalities of treatment for the above conditions, to include methimazole use, radioactive iodine ablation or surgery. He opted for MMI rather than RAI tx, if we need to treat. - I will add a beta blocker at this time, since he is tachycardic - I advised him to join my chart to communicate easier - RTC in 3 months, but likely sooner for repeat labs  Component     Latest Ref Rng 10/05/2013  TSH     0.35 - 4.50 uIU/mL 0.24 (L)  Free T4     0.60 - 1.60 ng/dL 0.84  T3, Free     2.3 - 4.2 pg/mL 2.9  TSI     <140 % baseline 25   Subclinical hyperthyroidism - with mildly low TSH, will recheck TFTs when he comes back in 3 mo. No need for MMI for now. Will not check an Uptake and scan since he refuses RAI tx, but may suggest it in the future if TSH decreases further.  Called and discussed with pt >> agrees with the plan above. He tells me his tachycardia only happens when he comes to the dr.

## 2013-10-05 NOTE — Patient Instructions (Signed)
Please stop at the lab. Please start Atenolol 25 mg daily. Please come back for a follow-up appointment in 3 months. Here is information about hyperthyroidism (overactive thyroid).  Hyperthyroidism The thyroid is a large gland located in the lower front part of your neck. The thyroid helps control metabolism. Metabolism is how your body uses food. It controls metabolism with the hormone thyroxine. When the thyroid is overactive, it produces too much hormone. When this happens, these following problems may occur:   Nervousness  Heat intolerance  Weight loss (in spite of increase food intake)  Diarrhea  Change in hair or skin texture  Palpitations (heart skipping or having extra beats)  Tachycardia (rapid heart rate)  Loss of menstruation (amenorrhea)  Shaking of the hands CAUSES  Grave's Disease (the immune system attacks the thyroid gland). This is the most common cause.  Inflammation of the thyroid gland.  Tumor (usually benign) in the thyroid gland or elsewhere.  Excessive use of thyroid medications (both prescription and 'natural').  Excessive ingestion of Iodine. DIAGNOSIS  To prove hyperthyroidism, your caregiver may do blood tests and ultrasound tests. Sometimes the signs are hidden. It may be necessary for your caregiver to watch this illness with blood tests, either before or after diagnosis and treatment. TREATMENT Short-term treatment There are several treatments to control symptoms. Drugs called beta blockers may give some relief. Drugs that decrease hormone production will provide temporary relief in many people. These measures will usually not give permanent relief. Definitive therapy There are treatments available which can be discussed between you and your caregiver which will permanently treat the problem. These treatments range from surgery (removal of the thyroid), to the use of radioactive iodine (destroys the thyroid by radiation), to the use of  antithyroid drugs (interfere with hormone synthesis). The first two treatments are permanent and usually successful. They most often require hormone replacement therapy for life. This is because it is impossible to remove or destroy the exact amount of thyroid required to make a person euthyroid (normal). HOME CARE INSTRUCTIONS  See your caregiver if the problems you are being treated for get worse. Examples of this would be the problems listed above. SEEK MEDICAL CARE IF: Your general condition worsens. MAKE SURE YOU:   Understand these instructions.  Will watch your condition.  Will get help right away if you are not doing well or get worse. Document Released: 04/13/2005 Document Revised: 07/06/2011 Document Reviewed: 08/25/2006 [facility] Patient Information 2014 [place], [place].

## 2013-10-09 LAB — THYROID STIMULATING IMMUNOGLOBULIN: TSI: 25 % baseline (ref <140)

## 2013-12-26 ENCOUNTER — Encounter: Payer: Self-pay | Admitting: Internal Medicine

## 2013-12-26 ENCOUNTER — Other Ambulatory Visit: Payer: Self-pay | Admitting: Internal Medicine

## 2013-12-26 ENCOUNTER — Ambulatory Visit (INDEPENDENT_AMBULATORY_CARE_PROVIDER_SITE_OTHER): Payer: Commercial Managed Care - HMO | Admitting: Internal Medicine

## 2013-12-26 ENCOUNTER — Other Ambulatory Visit: Payer: Self-pay | Admitting: *Deleted

## 2013-12-26 VITALS — BP 112/64 | HR 67 | Temp 98.3°F | Resp 12 | Wt 193.0 lb

## 2013-12-26 DIAGNOSIS — R7989 Other specified abnormal findings of blood chemistry: Secondary | ICD-10-CM

## 2013-12-26 DIAGNOSIS — R946 Abnormal results of thyroid function studies: Secondary | ICD-10-CM

## 2013-12-26 NOTE — Progress Notes (Signed)
Patient ID: Jack [name], male   DOB: 27-Sep-1945, 68 y.o.   MRN: [id number]   HPI  Jack [name] is a 68 y.o.-year-old male, returning for f/u  for low TSH levels. Last visit 2.5 mo ago.  I reviewed pt's thyroid tests: Lab Results  Component Value Date   TSH 0.24* 10/05/2013   TSH 0.24* 09/27/2013   TSH 0.30* 05/09/2010   TSH 0.41 02/27/2008   TSH 0.33* 02/10/2007   FREET4 0.84 10/05/2013    Last set of TFTs:  Component     Latest Ref Rng 10/05/2013  TSH     0.35 - 4.50 uIU/mL 0.24 (L)  Free T4     0.60 - 1.60 ng/dL 0.84  T3, Free     2.3 - 4.2 pg/mL 2.9  TSI     <140 % baseline 25   Pt denies feeling nodules in neck, hoarseness, dysphagia/odynophagia, SOB with lying down.  He has no complaints: - no  excessive sweating/heat intolerance - no tremors - no anxiety - no palpitations (he is on Atenolol since 2002) - no problems with concentration - no fatigue - no hyperdefecation - no weight loss  I reviewed pt's medications, allergies, PMH, social hx, family hx and no changes required, except as mentioned above.  ROS: Constitutional: no weight gain/loss, no fatigue, no subjective hyperthermia/hypothermia Eyes: no blurry vision, no xerophthalmia ENT: no sore throat, no nodules palpated in throat, no dysphagia/odynophagia, no hoarseness, + decreased hearing Cardiovascular: no CP/SOB/palpitations/leg swelling Respiratory: no cough/SOB Gastrointestinal: no N/V/D/C Musculoskeletal: no muscle/joint aches Skin: no rashes Neurological: no tremors/numbness/tingling/dizziness  PE: BP 112/64  Pulse 67  Temp(Src) 98.3 F (36.8 C) (Oral)  Resp 12  Wt 193 lb (87.544 kg)  SpO2 97% Wt Readings from Last 3 Encounters:  12/26/13 193 lb (87.544 kg)  10/05/13 189 lb (85.73 kg)  09/27/13 192 lb (87.091 kg)   Constitutional: overweight, in NAD Eyes: PERRLA, EOMI, no exophthalmos, no lid lag, no stare ENT: moist mucous membranes, no thyromegaly, no thyroid bruits, no cervical  lymphadenopathy Cardiovascular: tachycardia, RRR, No MRG Respiratory: CTA B Gastrointestinal: abdomen soft, NT, ND, BS+ Musculoskeletal: no deformities, strength intact in all 4 Skin: moist, warm, no rashes Neurological: very mild horizontal tremor with outstretched hands, DTR normal in all 4  ASSESSMENT: 1. Subclinical hyperthyroidism  PLAN:  1. Patient with a low or low normal TSH tracing back to 2008, with no thyrotoxic signs/sxs especially recently - he does not appear to have exogenous causes for the low TSH.  - We have discussed in the past that possible causes of thyrotoxicosis are:  Graves ds   Thyroiditis toxic multinodular goiter/ toxic adenoma (I cannot feel nodules at palpation of his thyroid). And at this visit, we again discussed that, if the tests are worse, we may need to obtain an uptake and scan to differentiate between the 3 above possible etiologies  - will check the TSH, fT3 and fT4 today  - He opted for MMI rather than RAI tx, if we need to treat. - we will continue Atenolol 25 mg daily - I advised him to join my chart to communicate easier - RTC in 6 months, but likely sooner for repeat labs  Orders Only on 12/26/2013  Component Date Value Ref Range Status  . TSH 12/26/2013 0.428  0.350 - 4.500 uIU/mL Final  . Free T4 12/26/2013 1.04  0.80 - 1.80 ng/dL Final  . T3, Free 12/26/2013 2.8  2.3 - 4.2 pg/mL Final  TFTs normalized. We will repeat them when he comes back in 6 mo.

## 2013-12-26 NOTE — Patient Instructions (Signed)
Please stop at the lab downstairs. Please come back for a follow-up appointment in 6 months.

## 2013-12-27 ENCOUNTER — Encounter: Payer: Self-pay | Admitting: *Deleted

## 2013-12-27 LAB — T4, FREE: Free T4: 1.04 ng/dL (ref 0.80–1.80)

## 2013-12-27 LAB — T3, FREE: T3, Free: 2.8 pg/mL (ref 2.3–4.2)

## 2013-12-27 LAB — TSH: TSH: 0.428 u[IU]/mL (ref 0.350–4.500)

## 2014-01-15 ENCOUNTER — Other Ambulatory Visit: Payer: Self-pay | Admitting: Internal Medicine

## 2014-04-16 ENCOUNTER — Other Ambulatory Visit: Payer: Self-pay | Admitting: Internal Medicine

## 2014-05-17 ENCOUNTER — Ambulatory Visit (INDEPENDENT_AMBULATORY_CARE_PROVIDER_SITE_OTHER): Payer: Commercial Managed Care - HMO

## 2014-05-17 DIAGNOSIS — Z23 Encounter for immunization: Secondary | ICD-10-CM

## 2014-06-26 ENCOUNTER — Encounter: Payer: Self-pay | Admitting: Internal Medicine

## 2014-06-26 ENCOUNTER — Ambulatory Visit (INDEPENDENT_AMBULATORY_CARE_PROVIDER_SITE_OTHER): Payer: Commercial Managed Care - HMO | Admitting: Internal Medicine

## 2014-06-26 VITALS — BP 122/64 | HR 86 | Temp 98.0°F | Resp 12 | Wt 197.0 lb

## 2014-06-26 DIAGNOSIS — E0590 Thyrotoxicosis, unspecified without thyrotoxic crisis or storm: Secondary | ICD-10-CM

## 2014-06-26 LAB — T3, FREE: T3 FREE: 3.2 pg/mL (ref 2.3–4.2)

## 2014-06-26 LAB — T4, FREE: FREE T4: 0.86 ng/dL (ref 0.60–1.60)

## 2014-06-26 LAB — TSH: TSH: 0.43 u[IU]/mL (ref 0.35–4.50)

## 2014-06-26 MED ORDER — ATENOLOL 25 MG PO TABS: 25.0000 mg | ORAL_TABLET | Freq: Every day | ORAL | Status: DC

## 2014-06-26 NOTE — Progress Notes (Signed)
Patient ID: [name], male   DOB: December 04, 1945, 69 y.o.   MRN: [id number]   HPI  Jack [name] is a 69 y.o.-year-old male, returning for f/u  for low TSH levels. Last visit 6 mo ago.  I reviewed pt's thyroid tests - normalized at last check: Lab Results  Component Value Date   TSH 0.428 12/26/2013   TSH 0.24* 10/05/2013   TSH 0.24* 09/27/2013   TSH 0.30* 05/09/2010   TSH 0.41 02/27/2008   TSH 0.33* 02/10/2007   FREET4 1.04 12/26/2013   FREET4 0.84 10/05/2013    Component     Latest Ref Rng 10/05/2013  TSI     <140 % baseline 25   Pt denies feeling nodules in neck, hoarseness, dysphagia/odynophagia, SOB with lying down.  He has no complaints: - no  excessive sweating/heat intolerance - + tremors (not new) - no anxiety - no palpitations (he is on Atenolol since 2002) - no heat/cold intolerance - no fatigue - no hyperdefecation - no weight loss  I reviewed pt's medications, allergies, PMH, social hx, family hx, and changes were documented in the history of present illness. Otherwise, unchanged from my initial visit note.  ROS: Constitutional: no weight gain/loss, no fatigue, no subjective hyperthermia/hypothermia, + occasional poor sleep Eyes: no blurry vision, no xerophthalmia ENT: no sore throat, no nodules palpated in throat, no dysphagia/odynophagia, no hoarseness, + decreased hearing Cardiovascular: no CP/SOB/palpitations/leg swelling Respiratory: no cough/SOB Gastrointestinal: no N/V/D/C Musculoskeletal: no muscle/joint aches Skin: no rashes Neurological: + tremors (not new)/numbness/tingling/dizziness  PE: BP 122/64 mmHg  Pulse 86  Temp(Src) 98 F (36.7 C) (Oral)  Resp 12  Wt 197 lb (89.359 kg)  SpO2 97% Body mass index is 31.81 kg/(m^2). Wt Readings from Last 3 Encounters:  06/26/14 197 lb (89.359 kg)  12/26/13 193 lb (87.544 kg)  10/05/13 189 lb (85.73 kg)   Constitutional: overweight, in NAD Eyes: PERRLA, EOMI, no exophthalmos, no lid lag, no  stare ENT: moist mucous membranes, no thyromegaly, no cervical lymphadenopathy Cardiovascular: RRR, No MRG Respiratory: CTA B Gastrointestinal: abdomen soft, NT, ND, BS+ Musculoskeletal: no deformities, strength intact in all 4 Skin: moist, warm, no rashes Neurological: very mild tremor with outstretched hands, DTR normal in all 4  ASSESSMENT: 1. Subclinical hyperthyroidism  PLAN:  1. Patient with a low or low normal TSH tracing back to 2008, with no thyrotoxic signs/sxs  - he does not appear to have exogenous causes for the low TSH.  - at last visit, we checked his TFTs >> normalized, so we decided to just follow the TFTs  - at this visit, we again discussed that, if the tests are worse, we may need to obtain an uptake and scan to differentiate between the 3 possible etiologies: Graves ds, TMNG/TUNG, thyroiditis (doubt the latter due to protracted course) - will check the TSH, fT3 and fT4 today  - He opted for MMI rather than RAI tx, if we need to treat. - we will continue Atenolol 25 mg daily >> refilled - RTC in 6 months, but possibly sooner for repeat labs  Office Visit on 06/26/2014  Component Date Value Ref Range Status  . TSH 06/26/2014 0.43  0.35 - 4.50 uIU/mL Final  . Free T4 06/26/2014 0.86  0.60 - 1.60 ng/dL Final  . T3, Free 06/26/2014 3.2  2.3 - 4.2 pg/mL Final   TFTs normal >> will repeat labs in 6 mo as discussed.

## 2014-06-26 NOTE — Patient Instructions (Signed)
Please stop at the lab.  Please come back for a follow-up appointment in 6 months.  

## 2014-09-26 ENCOUNTER — Telehealth: Payer: Self-pay

## 2014-09-26 NOTE — Telephone Encounter (Signed)
Call to introduce AWV and spoke to wife; Agreed to have the patient come in 6/9 at 1:45 prior to apt with Dr. Jenny Reichmann at 2:30

## 2014-10-04 ENCOUNTER — Encounter: Payer: Self-pay | Admitting: Internal Medicine

## 2014-10-04 ENCOUNTER — Ambulatory Visit (INDEPENDENT_AMBULATORY_CARE_PROVIDER_SITE_OTHER): Payer: Commercial Managed Care - HMO | Admitting: Internal Medicine

## 2014-10-04 ENCOUNTER — Other Ambulatory Visit (INDEPENDENT_AMBULATORY_CARE_PROVIDER_SITE_OTHER): Payer: Commercial Managed Care - HMO

## 2014-10-04 VITALS — BP 118/60 | HR 57 | Temp 98.1°F | Ht 66.0 in | Wt 192.8 lb

## 2014-10-04 DIAGNOSIS — Z23 Encounter for immunization: Secondary | ICD-10-CM | POA: Diagnosis not present

## 2014-10-04 DIAGNOSIS — R7989 Other specified abnormal findings of blood chemistry: Secondary | ICD-10-CM

## 2014-10-04 DIAGNOSIS — Z0000 Encounter for general adult medical examination without abnormal findings: Secondary | ICD-10-CM

## 2014-10-04 LAB — LIPID PANEL
CHOL/HDL RATIO: 4
CHOLESTEROL: 177 mg/dL (ref 0–200)
HDL: 46.4 mg/dL (ref >39.00)
NONHDL: 130.6
Triglycerides: 202 mg/dL — ABNORMAL HIGH (ref 0.0–149.0)
VLDL: 40.4 mg/dL — ABNORMAL HIGH (ref 0.0–40.0)

## 2014-10-04 LAB — CBC WITH DIFFERENTIAL/PLATELET
Basophils Absolute: 0.1 10*3/uL (ref 0.0–0.1)
Basophils Relative: 0.4 % (ref 0.0–3.0)
EOS PCT: 0.9 % (ref 0.0–5.0)
Eosinophils Absolute: 0.1 10*3/uL (ref 0.0–0.7)
HEMATOCRIT: 43.9 % (ref 39.0–52.0)
HEMOGLOBIN: 14.8 g/dL (ref 13.0–17.0)
LYMPHS ABS: 2.7 10*3/uL (ref 0.7–4.0)
Lymphocytes Relative: 24 % (ref 12.0–46.0)
MCHC: 33.8 g/dL (ref 30.0–36.0)
MCV: 96.1 fl (ref 78.0–100.0)
MONOS PCT: 7.5 % (ref 3.0–12.0)
Monocytes Absolute: 0.9 10*3/uL (ref 0.1–1.0)
Neutro Abs: 7.7 10*3/uL (ref 1.4–7.7)
Neutrophils Relative %: 67.2 % (ref 43.0–77.0)
PLATELETS: 213 10*3/uL (ref 150.0–400.0)
RBC: 4.57 Mil/uL (ref 4.22–5.81)
RDW: 13 % (ref 11.5–15.5)
WBC: 11.4 10*3/uL — AB (ref 4.0–10.5)

## 2014-10-04 LAB — LDL CHOLESTEROL, DIRECT: LDL DIRECT: 86 mg/dL

## 2014-10-04 LAB — URINALYSIS, ROUTINE W REFLEX MICROSCOPIC
Bilirubin Urine: NEGATIVE
Hgb urine dipstick: NEGATIVE
Ketones, ur: NEGATIVE
Leukocytes, UA: NEGATIVE
Nitrite: NEGATIVE
PH: 6 (ref 5.0–8.0)
RBC / HPF: NONE SEEN (ref 0–?)
Total Protein, Urine: NEGATIVE
Urine Glucose: NEGATIVE
Urobilinogen, UA: 0.2 (ref 0.0–1.0)

## 2014-10-04 LAB — HEPATIC FUNCTION PANEL
ALT: 11 U/L (ref 0–53)
AST: 19 U/L (ref 0–37)
Albumin: 4.3 g/dL (ref 3.5–5.2)
Alkaline Phosphatase: 68 U/L (ref 39–117)
BILIRUBIN TOTAL: 0.4 mg/dL (ref 0.2–1.2)
Bilirubin, Direct: 0.1 mg/dL (ref 0.0–0.3)
Total Protein: 7.4 g/dL (ref 6.0–8.3)

## 2014-10-04 LAB — BASIC METABOLIC PANEL
BUN: 21 mg/dL (ref 6–23)
CALCIUM: 9.9 mg/dL (ref 8.4–10.5)
CHLORIDE: 105 meq/L (ref 96–112)
CO2: 28 meq/L (ref 19–32)
CREATININE: 1.22 mg/dL (ref 0.40–1.50)
GFR: 75.73 mL/min (ref >60.00)
GLUCOSE: 95 mg/dL (ref 70–99)
Potassium: 4.2 mEq/L (ref 3.5–5.1)
Sodium: 138 mEq/L (ref 135–145)

## 2014-10-04 LAB — PSA: PSA: 3.01 ng/mL (ref 0.10–4.00)

## 2014-10-04 LAB — TSH: TSH: 0.25 u[IU]/mL — ABNORMAL LOW (ref 0.35–4.50)

## 2014-10-04 MED ORDER — SIMVASTATIN 40 MG PO TABS: 40.0000 mg | ORAL_TABLET | Freq: Every day | ORAL | Status: DC

## 2014-10-04 MED ORDER — LISINOPRIL-HYDROCHLOROTHIAZIDE 20-12.5 MG PO TABS: 1.0000 | ORAL_TABLET | Freq: Every day | ORAL | Status: DC

## 2014-10-04 NOTE — Patient Instructions (Addendum)
Please continue all other medications as before, and refills have been done if requested.  Please have the pharmacy call with any other refills you may need.  Please continue your efforts at being more active, low cholesterol diet, and weight control.  You are otherwise up to date with prevention measures today.  Please keep your appointments with your specialists as you may have planned  Please go to the LAB in the Basement (turn left off the elevator) for the tests to be done today  You will be contacted by phone if any changes need to be made immediately.  Otherwise, you will receive a letter about your results with an explanation, but please check with MyChart first.  Please remember to sign up for MyChart if you have not done so, as this will be important to you in the future with finding out test results, communicating by private email, and scheduling acute appointments online when needed.  Please return in 1 year for your yearly visit, or sooner if needed, with Lab testing done 3-5 days before  Also: [name] , Thank you for taking time to come for your Medicare Wellness Visit. I appreciate your ongoing commitment to your health goals. Please review the following plan we discussed and let me know if I can assist you in the future.   Will review PSV 23 with Dr. Trevone/ had around 69 yo and Prevnar in 2015 Hx of shingles; Referred to [facility] for shingles vaccine under Part D  Will call [employer] and make apt with eye doctor as his eye doctor retired/ Understands office can refer to ophthalmologist  Will check on having a hearing test;    These are the goals we discussed: Goals    . Exercise 3x per week (30 min per time)     Wants to stay flexible; will start some home exercise       This is a list of the screening recommended for you and due dates:  Health Maintenance  Topic Date Due  . Shingles Vaccine  09/16/2005  . Pneumonia vaccines (2 of 2 - PPSV23) 09/28/2014  . Flu  Shot  11/26/2014  . Tetanus Vaccine  02/15/2017  . Colon Cancer Screening  03/07/2017

## 2014-10-04 NOTE — Progress Notes (Signed)
Subjective:   Jack [name] is a 69 y.o. male who presents for Medicare Annual/Subsequent preventive examination.  Review of Systems:  HRA assessment completed during visit;  Patient is here for Annual Wellness Assessment  The patient describes their health better, the same or worse than last year? Feels good Reviewed: BMI: > 30 risk for HD;  Diet; bacon and eggs; hamburger; small dinner Exercise; Gets out and mows; work on cars;  [employer] busy; all day, is not sedentary; Has plate in right leg gets weak and he has to sit down;  Family Hx Father heart disease;  Social history: lives with wife who smokes  No hx of heart issues Educated metabolic syndrome Psychosocial support; safe community; firearm safety; smoke alarms;  Has camera up / alarm system Caregiver to other? Taken care of his neighbor but has passed away  Discussed Goal to improve health based on risk Home exercise / keeps grand-children;    Screenings; Colonoscopy; 2008;  no problems EKG 09/27/2013 PSA 04/2011   Vision: has not had an eye doctor; will check with [employer] to see who he can go to; his eye doctor retired; has cataract in right eye  Hearing: 3000hz  in left ear; 2000 hz in right ear Dental: will make apt; Dr. Andres Labrum at [employer] information on safety to take home;   Current Care Team reviewed and updated            Objective:    Vitals: BP 118/60 mmHg  Pulse 57  Temp(Src) 98.1 F (36.7 C) (Oral)  Ht 5\' 6"  (1.676 m)  Wt 192 lb 12 oz (87.431 kg)  BMI 31.13 kg/m2  SpO2 98%  Tobacco History  Smoking status  . Never Smoker   Smokeless tobacco  . Never Used     Counseling given: Yes   Past Medical History  Diagnosis Date  . ALLERGIC RHINITIS 02/16/2007  . BACK PAIN 02/16/2007  . FOOT PAIN, CHRONIC 02/27/2008  . 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 HYPERLIPIDEMIA 02/16/2007  . HYPERTENSION 02/16/2007  . PROSTATE SPECIFIC ANTIGEN, ELEVATED 02/16/2007  .  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 Diverticulosis 09/27/2013   Past Surgical History  Procedure Laterality Date  . Foot surgery      reconstruction x's 4 after work accident   Family History  Problem Relation Age of Onset  . Cancer Mother     throat & lung  . Coronary artery disease Father   . Hypertension Father   . Cancer Sister   . Alcohol abuse Brother    History  Sexual Activity  . Sexual Activity:  . Partners: Female    Outpatient Encounter Prescriptions as of 10/04/2014  Medication Sig  . atenolol (TENORMIN) 25 MG tablet Take 1 tablet (25 mg total) by mouth daily.  . clidinium-chlordiazePOXIDE (LIBRAX) 2.5-5 MG per capsule TAKE AS DIRECTED  . finasteride (PROSCAR) 5 MG tablet Take 1 tablet (5 mg total) by mouth daily.  Marland Kitchen lisinopril-hydrochlorothiazide (PRINZIDE,ZESTORETIC) 20-12.5 MG per tablet Take 1 tablet by mouth daily.  . simvastatin (ZOCOR) 40 MG tablet Take 1 tablet (40 mg total) by mouth daily at 6 PM.  . [DISCONTINUED] simvastatin (ZOCOR) 40 MG tablet TAKE 1 TABLET BY MOUTH EVERY EVENING   No facility-administered encounter medications on file as of 10/04/2014.    Activities of Daily Living In your present state of health, do you have any difficulty performing the following activities: 10/04/2014  Hearing? N  Vision? N  Difficulty concentrating or making decisions? N  Walking or climbing stairs? N  Dressing or bathing? N  Doing errands, shopping? N    Patient Care Team: Biagio Borg, MD as PCP - General (Internal Medicine) Festus Aloe, MD as Attending Physician (Urology)   Assessment:    Objective:   Personalized Education given regarding:   Pt determined a personalized goal / home exercise  Assessment included:  Taking meds without issues; no barriers identified Stress: Recommendations for managing stress if assessed as a factor;  No Risk for hepatitis or high risk social behavior identified via hepatitis screen/  Educated on shingles and follow up  with insurance company for co-pays or charges applied to Part D benefit. Hx shingles in right eye Safety issues reviewed Cognition assessed by AD8; score 0 MMSE / deferred per the patient No issues at present assessed today;     Need for Immunizations or other screenings identified;  Defer 23 to Dr. Jenny Reichmann; had 23 around 60 and the prevnar 2015/  (CDC recommmend Prevnar at 76 followed by pnuemovax 23 in one year or 5 years after the last dose.  Other screenings reviewed; Vaccine status/ discussed PSV 23 Colonoscopy not due Eye exam to schedule Hearing to schedule Dental;   Exercise Activities and Dietary recommendations    Goals    . Exercise 3x per week (30 min per time)     Wants to stay flexible; will start some home exercise      Fall Risk Fall Risk  10/04/2014  Falls in the past year? No   Depression Screen PHQ 2/9 Scores 10/04/2014 09/27/2013  PHQ - 2 Score 0 0    Cognitive Testing No flowsheet data found.  Immunization History  Administered Date(s) Administered  . Influenza Whole 02/16/2007, 02/27/2008  . Influenza,inj,Quad PF,36+ Mos 05/17/2014  . Pneumococcal Conjugate-13 09/27/2013  . Pneumococcal Polysaccharide-23 02/27/2008  . Td 02/16/2007   Screening Tests Health Maintenance  Topic Date Due  . ZOSTAVAX  09/16/2005  . PNA vac Low Risk Adult (2 of 2 - PPSV23) 09/28/2014  . INFLUENZA VACCINE  11/26/2014  . TETANUS/TDAP  02/15/2017  . COLONOSCOPY  03/07/2017      Plan:     Plan   The patient agrees to: 1. May have hearing checked 2. Will call [employer] regarding eye doctor as his has retired 3. Took PSV 23 today Advanced directive: given information    During the course of the visit the patient was educated and counseled about the following appropriate screening and preventive services:   Vaccines to include Pneumoccal, Influenza, Hepatitis B, Td, Zostavax, HCV/ defer 23 to Dr. Jenny Reichmann  Electrocardiogram  Cardiovascular Disease  Colorectal  cancer screening  Diabetes screening  Prostate Cancer Screening  Glaucoma screening  Nutrition counseling   Smoking cessation counseling  Patient Instructions (the written plan) was given to the patient.    MMCRF,VOHKG, RN  10/04/2014   Medical screening examination/treatment/procedure(s) were performed by non-physician practitioner and as supervising physician I was immediately available for consultation/collaboration. I agree with above. Cathlean Cower, MD

## 2014-10-05 NOTE — Assessment & Plan Note (Signed)

## 2014-10-05 NOTE — Progress Notes (Signed)
Subjective:    Patient ID: Jack [name], male    DOB: [birth year], 69 y.o.   MRN: [id number]  HPI  Here for wellness and f/u;  Overall doing ok;  Pt denies Chest pain, worsening SOB, DOE, wheezing, orthopnea, PND, worsening LE edema, palpitations, dizziness or syncope.  Pt denies neurological change such as new headache, facial or extremity weakness.  Pt denies polydipsia, polyuria, or low sugar symptoms. Pt states overall good compliance with treatment and medications, good tolerability, and has been trying to follow appropriate diet.  Pt denies worsening depressive symptoms, suicidal ideation or panic. No fever, night sweats, wt loss, loss of appetite, or other constitutional symptoms.  Pt states good ability with ADL's, has low fall risk, home safety reviewed and adequate, no other significant changes in hearing or vision, and only occasionally active with exercise.  No current complaints Past Medical History  Diagnosis Date  . ALLERGIC RHINITIS 02/16/2007  . BACK PAIN 02/16/2007  . FOOT PAIN, CHRONIC 02/27/2008  . 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 HYPERLIPIDEMIA 02/16/2007  . HYPERTENSION 02/16/2007  . PROSTATE SPECIFIC ANTIGEN, ELEVATED 02/16/2007  . 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 Diverticulosis 09/27/2013   Past Surgical History  Procedure Laterality Date  . Foot surgery      reconstruction x's 4 after work accident    reports that he has never smoked. He has never used smokeless tobacco. He reports that he does not drink alcohol or use illicit drugs. family history includes Alcohol abuse in his brother; Cancer in his mother and sister; Coronary artery disease in his father; Hypertension in his father. No Known Allergies Current Outpatient Prescriptions on File Prior to Visit  Medication Sig Dispense Refill  . atenolol (TENORMIN) 25 MG tablet Take 1 tablet (25 mg total) by mouth daily. 90 tablet 3  . clidinium-chlordiazePOXIDE (LIBRAX) 2.5-5 MG per capsule TAKE AS DIRECTED 30 capsule 1    . finasteride (PROSCAR) 5 MG tablet Take 1 tablet (5 mg total) by mouth daily. 30 tablet 11   No current facility-administered medications on file prior to visit.   Review of Systems Constitutional: Negative for increased diaphoresis, other activity, appetite or siginficant weight change other than noted HENT: Negative for worsening hearing loss, ear pain, facial swelling, mouth sores and neck stiffness.   Eyes: Negative for other worsening pain, redness or visual disturbance.  Respiratory: Negative for shortness of breath and wheezing  Cardiovascular: Negative for chest pain and palpitations.  Gastrointestinal: Negative for diarrhea, blood in stool, abdominal distention or other pain Genitourinary: Negative for hematuria, flank pain or change in urine volume.  Musculoskeletal: Negative for myalgias or other joint complaints.  Skin: Negative for color change and wound or drainage.  Neurological: Negative for syncope and numbness. other than noted Hematological: Negative for adenopathy. or other swelling Psychiatric/Behavioral: Negative for hallucinations, SI, self-injury, decreased concentration or other worsening agitation.      Objective:   Physical Exam BP 118/60 mmHg  Pulse 57  Temp(Src) 98.1 F (36.7 C) (Oral)  Ht 5\' 6"  (1.676 m)  Wt 192 lb 12 oz (87.431 kg)  BMI 31.13 kg/m2  SpO2 98% VS noted,  Constitutional: Pt is oriented to person, place, and time. Appears well-developed and well-nourished, in no significant distress Head: Normocephalic and atraumatic.  Right Ear: External ear normal.  Left Ear: External ear normal.  Nose: Nose normal.  Mouth/Throat: Oropharynx is clear and moist.  Eyes: Conjunctivae and EOM are normal. Pupils are equal,  round, and reactive to light.  Neck: Normal range of motion. Neck supple. No JVD present. No tracheal deviation present or significant neck LA or mass Cardiovascular: Normal rate, regular rhythm, normal heart sounds and intact distal  pulses.   Pulmonary/Chest: Effort normal and breath sounds without rales or wheezing  Abdominal: Soft. Bowel sounds are normal. NT. No HSM  Musculoskeletal: Normal range of motion. Exhibits no edema.  Lymphadenopathy:  Has no cervical adenopathy.  Neurological: Pt is alert and oriented to person, place, and time. Pt has normal reflexes. No cranial nerve deficit. Motor grossly intact Skin: Skin is warm and dry. No rash noted.  Psychiatric:  Has normal mood and affect. Behavior is normal.  Lab Results  Component Value Date   WBC 11.4* 10/04/2014   HGB 14.8 10/04/2014   HCT 43.9 10/04/2014   PLT 213.0 10/04/2014   GLUCOSE 95 10/04/2014   CHOL 177 10/04/2014   TRIG 202.0* 10/04/2014   HDL 46.40 10/04/2014   LDLDIRECT 86.0 10/04/2014   LDLCALC 73 09/27/2013   ALT 11 10/04/2014   AST 19 10/04/2014   NA 138 10/04/2014   K 4.2 10/04/2014   CL 105 10/04/2014   CREATININE 1.22 10/04/2014   BUN 21 10/04/2014   CO2 28 10/04/2014   TSH 0.25* 10/04/2014   PSA 3.01 10/04/2014       Assessment & Plan:

## 2014-10-17 ENCOUNTER — Telehealth: Payer: Self-pay | Admitting: Internal Medicine

## 2014-10-17 NOTE — Telephone Encounter (Signed)
[facility] has not received Zocor.  Is requesting it to be resent.

## 2014-10-17 NOTE — Telephone Encounter (Signed)
Patient is requesting a [employer] referral to be sent to Dr. Verlin Fester at [facility].  Patient has appointment scheduled for 11/13/14 at 2:15pm.

## 2014-10-18 MED ORDER — SIMVASTATIN 40 MG PO TABS: 40.0000 mg | ORAL_TABLET | Freq: Every day | ORAL | Status: DC

## 2014-10-26 ENCOUNTER — Telehealth: Payer: Self-pay | Admitting: Internal Medicine

## 2014-10-26 NOTE — Telephone Encounter (Signed)
Pt came in request [place] referral for Dr. Junious Silk (BPH N 40.0) to get his prostate check, the appt is 11/13/14. Please help

## 2014-10-30 NOTE — Telephone Encounter (Signed)
[name] #[id number] valid 10/30/2014 - 04/28/2015 for 6 visits

## 2015-02-21 ENCOUNTER — Telehealth: Payer: Self-pay | Admitting: Internal Medicine

## 2015-02-21 NOTE — Telephone Encounter (Signed)
pts wife called and pt is having some sinus issues and is having a hard sleeping. She is wondering if you are able to call anything in for it. Please advise

## 2015-02-21 NOTE — Telephone Encounter (Signed)
Pt needs an office visit for evaluation and treatment of symptoms

## 2015-02-22 NOTE — Telephone Encounter (Signed)
Tried calling patient back and phone just rang.

## 2015-02-26 ENCOUNTER — Encounter: Payer: Self-pay | Admitting: Internal Medicine

## 2015-02-26 ENCOUNTER — Ambulatory Visit (INDEPENDENT_AMBULATORY_CARE_PROVIDER_SITE_OTHER): Payer: Commercial Managed Care - HMO | Admitting: Internal Medicine

## 2015-02-26 VITALS — BP 124/76 | HR 75 | Temp 97.8°F | Ht 66.0 in | Wt 175.0 lb

## 2015-02-26 DIAGNOSIS — J309 Allergic rhinitis, unspecified: Secondary | ICD-10-CM | POA: Diagnosis not present

## 2015-02-26 DIAGNOSIS — I10 Essential (primary) hypertension: Secondary | ICD-10-CM | POA: Diagnosis not present

## 2015-02-26 MED ORDER — CETIRIZINE HCL 10 MG PO TABS: 10.0000 mg | ORAL_TABLET | Freq: Every day | ORAL | Status: DC

## 2015-02-26 MED ORDER — TRIAMCINOLONE ACETONIDE 55 MCG/ACT NA AERO: 2.0000 | INHALATION_SPRAY | Freq: Every day | NASAL | Status: DC

## 2015-02-26 MED ORDER — PREDNISONE 10 MG PO TABS: ORAL_TABLET | ORAL | Status: DC

## 2015-02-26 MED ORDER — METHYLPREDNISOLONE ACETATE 80 MG/ML IJ SUSP
80.0000 mg | Freq: Once | INTRAMUSCULAR | Status: AC
  Administered 2015-02-26: 80 mg via INTRAMUSCULAR

## 2015-02-26 NOTE — Progress Notes (Signed)
   Subjective:    Patient ID: Jack [name], male    DOB: April 13, 1946, 69 y.o.   MRN: [id number]  HPI  Here to fu - Does have several wks ongoing nasal allergy symptoms with clearish congestion, itch and sneezing, without fever, pain, ST, cough, swelling or wheezing.  Pt denies polydipsia, polyuria, No other complaints. Pt denies chest pain, increased sob or doe, wheezing, orthopnea, PND, increased LE swelling, palpitations, dizziness or syncope. Past Medical History  Diagnosis Date  . ALLERGIC RHINITIS 02/16/2007  . BACK PAIN 02/16/2007  . FOOT PAIN, CHRONIC 02/27/2008  . 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 HYPERLIPIDEMIA 02/16/2007  . HYPERTENSION 02/16/2007  . PROSTATE SPECIFIC ANTIGEN, ELEVATED 02/16/2007  . 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 Diverticulosis 09/27/2013   Past Surgical History  Procedure Laterality Date  . Foot surgery      reconstruction x's 4 after work accident    reports that he has never smoked. He has never used smokeless tobacco. He reports that he does not drink alcohol or use illicit drugs. family history includes Alcohol abuse in his brother; Cancer in his mother and sister; Coronary artery disease in his father; Hypertension in his father. No Known Allergies Current Outpatient Prescriptions on File Prior to Visit  Medication Sig Dispense Refill  . atenolol (TENORMIN) 25 MG tablet Take 1 tablet (25 mg total) by mouth daily. 90 tablet 3  . clidinium-chlordiazePOXIDE (LIBRAX) 2.5-5 MG per capsule TAKE AS DIRECTED 30 capsule 1  . finasteride (PROSCAR) 5 MG tablet Take 1 tablet (5 mg total) by mouth daily. 30 tablet 11  . lisinopril-hydrochlorothiazide (PRINZIDE,ZESTORETIC) 20-12.5 MG per tablet Take 1 tablet by mouth daily. 30 tablet 11  . simvastatin (ZOCOR) 40 MG tablet Take 1 tablet (40 mg total) by mouth daily at 6 PM. 90 tablet 3   No current facility-administered medications on file prior to visit.   Review of Systems All otherwise neg per pt     Objective:   Physical Exam BP 124/76 mmHg  Pulse 75  Temp(Src) 97.8 F (36.6 C) (Oral)  Ht 5\' 6"  (1.676 m)  Wt 175 lb (79.379 kg)  BMI 28.26 kg/m2  SpO2 98% VS noted, not ill appaering Constitutional: Pt appears in no significant distress HENT: Head: NCAT.  Right Ear: External ear normal.  Left Ear: External ear normal.  Bilat tm's with mild erythema.  Max sinus areas non tender.  Pharynx with mild erythema, no exudate Eyes: . Pupils are equal, round, and reactive to light. Conjunctivae and EOM are normal Neck: Normal range of motion. Neck supple.  Cardiovascular: Normal rate and regular rhythm.   Pulmonary/Chest: Effort normal and breath sounds without rales or wheezing.  Abd:  Soft, NT, ND, + BS Neurological: Pt is alert. Not confused , motor grossly intact Skin: Skin is warm. No rash, no LE edema Psychiatric: Pt behavior is normal. No agitation.     Assessment & Plan:

## 2015-02-26 NOTE — Patient Instructions (Signed)
You had the steroid shot today  Please take all new medication as prescribed - the prednisone, zyrtec and nasacort nasal spray  Please continue all other medications as before, and refills have been done if requested.  Please have the pharmacy call with any other refills you may need.  Please continue your efforts at being more active, low cholesterol diet, and weight control.  Please keep your appointments with your specialists as you may have planned  Please return in 6 months, or sooner if needed, with Lab testing done 3-5 days before

## 2015-02-26 NOTE — Progress Notes (Signed)
Pre visit review using our clinic review tool, if applicable. No additional management support is needed unless otherwise documented below in the visit note. 

## 2015-03-02 NOTE — Assessment & Plan Note (Signed)
Mild to mod, for depomedrol IM, predpac asd, also antihist/nasal steroid asd,  to f/u any worsening symptoms or concerns

## 2015-03-02 NOTE — Assessment & Plan Note (Signed)
stable overall by history and exam, recent data reviewed with pt, and pt to continue medical treatment as before,  to f/u any worsening symptoms or concerns BP Readings from Last 3 Encounters:  02/26/15 124/76  10/04/14 118/60  06/26/14 122/64

## 2015-03-19 ENCOUNTER — Other Ambulatory Visit: Payer: Self-pay | Admitting: Internal Medicine

## 2015-03-26 ENCOUNTER — Encounter (HOSPITAL_COMMUNITY): Payer: Self-pay | Admitting: Emergency Medicine

## 2015-03-26 ENCOUNTER — Emergency Department (HOSPITAL_COMMUNITY): Payer: Commercial Managed Care - HMO

## 2015-03-26 ENCOUNTER — Ambulatory Visit (INDEPENDENT_AMBULATORY_CARE_PROVIDER_SITE_OTHER): Payer: Commercial Managed Care - HMO | Admitting: Internal Medicine

## 2015-03-26 ENCOUNTER — Emergency Department (HOSPITAL_COMMUNITY)
Admission: EM | Admit: 2015-03-26 | Discharge: 2015-03-26 | Disposition: A | Discharge status: Home / Self Care | Payer: Commercial Managed Care - HMO | Attending: Emergency Medicine | Admitting: Emergency Medicine

## 2015-03-26 DIAGNOSIS — M1990 Unspecified osteoarthritis, unspecified site: Secondary | ICD-10-CM | POA: Diagnosis not present

## 2015-03-26 DIAGNOSIS — I10 Essential (primary) hypertension: Secondary | ICD-10-CM | POA: Insufficient documentation

## 2015-03-26 DIAGNOSIS — Z79899 Other long term (current) drug therapy: Secondary | ICD-10-CM | POA: Diagnosis not present

## 2015-03-26 DIAGNOSIS — Z8719 Personal history of other diseases of the digestive system: Secondary | ICD-10-CM | POA: Diagnosis not present

## 2015-03-26 DIAGNOSIS — Z8619 Personal history of other infectious and parasitic diseases: Secondary | ICD-10-CM | POA: Diagnosis not present

## 2015-03-26 DIAGNOSIS — G8929 Other chronic pain: Secondary | ICD-10-CM | POA: Insufficient documentation

## 2015-03-26 DIAGNOSIS — Z0289 Encounter for other administrative examinations: Secondary | ICD-10-CM

## 2015-03-26 DIAGNOSIS — R0602 Shortness of breath: Secondary | ICD-10-CM | POA: Diagnosis not present

## 2015-03-26 DIAGNOSIS — R0981 Nasal congestion: Secondary | ICD-10-CM | POA: Insufficient documentation

## 2015-03-26 DIAGNOSIS — E785 Hyperlipidemia, unspecified: Secondary | ICD-10-CM | POA: Diagnosis not present

## 2015-03-26 LAB — CBC
HEMATOCRIT: 44.3 % (ref 39.0–52.0)
Hemoglobin: 15.7 g/dL (ref 13.0–17.0)
MCH: 32.9 pg (ref 26.0–34.0)
MCHC: 35.4 g/dL (ref 30.0–36.0)
MCV: 92.9 fL (ref 78.0–100.0)
PLATELETS: 246 10*3/uL (ref 150–400)
RBC: 4.77 MIL/uL (ref 4.22–5.81)
RDW: 12.1 % (ref 11.5–15.5)
WBC: 12.1 10*3/uL — AB (ref 4.0–10.5)

## 2015-03-26 LAB — BASIC METABOLIC PANEL
ANION GAP: 10 (ref 5–15)
BUN: 31 mg/dL — ABNORMAL HIGH (ref 6–20)
CALCIUM: 10.1 mg/dL (ref 8.9–10.3)
CO2: 23 mmol/L (ref 22–32)
Chloride: 102 mmol/L (ref 101–111)
Creatinine, Ser: 1.35 mg/dL — ABNORMAL HIGH (ref 0.61–1.24)
GFR, EST NON AFRICAN AMERICAN: 52 mL/min — AB (ref >60)
GLUCOSE: 115 mg/dL — AB (ref 65–99)
POTASSIUM: 4.1 mmol/L (ref 3.5–5.1)
Sodium: 135 mmol/L (ref 135–145)

## 2015-03-26 LAB — I-STAT TROPONIN, ED: Troponin i, poc: 0.01 ng/mL (ref 0.00–0.08)

## 2015-03-26 MED ORDER — CETIRIZINE HCL 10 MG PO TABS: 10.0000 mg | ORAL_TABLET | Freq: Every day | ORAL | Status: DC

## 2015-03-26 MED ORDER — TRIAMCINOLONE ACETONIDE 55 MCG/ACT NA AERO: 2.0000 | INHALATION_SPRAY | Freq: Every day | NASAL | Status: DC

## 2015-03-26 MED ORDER — METHYLPREDNISOLONE 4 MG PO TBPK: ORAL_TABLET | ORAL | Status: DC

## 2015-03-26 NOTE — ED Provider Notes (Signed)
CSN: [id number]     Arrival date & time 03/26/15  1321 History   First MD Initiated Contact with Patient 03/26/15 1343     Chief Complaint  Patient presents with  . Shortness of Breath     (Consider location/radiation/quality/duration/timing/severity/associated sxs/prior Treatment) HPI  The patient is a 69 year old male, he has had nasal congestion over the last couple of months - he was seen by his PCP at the office (Dr. Jenny Reichmann) and was treated with several medicines including zyrtec, nasocort and a prednisone taper - he did well but has now run out of meds and states his n asal congestion has returned.  Sx are moderate, worse with laying down, not assocaited with cough, fevers or chest pain.  Past Medical History  Diagnosis Date  . ALLERGIC RHINITIS 02/16/2007  . BACK PAIN 02/16/2007  . FOOT PAIN, CHRONIC 02/27/2008  . 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 HYPERLIPIDEMIA 02/16/2007  . HYPERTENSION 02/16/2007  . PROSTATE SPECIFIC ANTIGEN, ELEVATED 02/16/2007  . 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 Diverticulosis 09/27/2013   Past Surgical History  Procedure Laterality Date  . Foot surgery      reconstruction x's 4 after work accident   Family History  Problem Relation Age of Onset  . Cancer Mother     throat & lung  . Coronary artery disease Father   . Hypertension Father   . Cancer Sister   . Alcohol abuse Brother    Social History  Substance Use Topics  . Smoking status: Never Smoker   . Smokeless tobacco: Never Used  . Alcohol Use: No     Comment: no    Review of Systems  All other systems reviewed and are negative.     Allergies  Review of patient's allergies indicates no known allergies.  Home Medications   Prior to Admission medications   Medication Sig Start Date End Date Taking? Authorizing Provider  atenolol (TENORMIN) 25 MG tablet Take 1 tablet (25 mg total) by mouth daily. 06/26/14   Philemon Kingdom, MD  cetirizine (ZYRTEC) 10 MG tablet Take 1 tablet (10 mg  total) by mouth daily. 03/26/15   Noemi Chapel, MD  clidinium-chlordiazePOXIDE (LIBRAX) 2.5-5 MG per capsule TAKE AS DIRECTED 08/21/11   Neena Rhymes, MD  finasteride (PROSCAR) 5 MG tablet Take 1 tablet (5 mg total) by mouth daily. 09/27/13   Biagio Borg, MD  lisinopril-hydrochlorothiazide (PRINZIDE,ZESTORETIC) 20-12.5 MG per tablet Take 1 tablet by mouth daily. 10/04/14   Biagio Borg, MD  methylPREDNISolone (MEDROL DOSEPAK) 4 MG TBPK tablet Tapering dose over 6 days 03/26/15   Noemi Chapel, MD  predniSONE (DELTASONE) 10 MG tablet 3 tabs by mouth per day for 3 days,2tabs per day for 3 days,1tab per day for 3 days 02/26/15   Biagio Borg, MD  simvastatin (ZOCOR) 40 MG tablet Take 1 tablet (40 mg total) by mouth daily at 6 PM. 10/18/14   Biagio Borg, MD  triamcinolone (NASACORT AQ) 55 MCG/ACT AERO nasal inhaler Place 2 sprays into the nose daily. 03/26/15   Noemi Chapel, MD   BP 121/82 mmHg  Pulse 71  Temp(Src) 97.8 F (36.6 C) (Oral)  Resp 20  Ht 5\' 6"  (1.676 m)  Wt 198 lb (89.812 kg)  BMI 31.97 kg/m2  SpO2 97% Physical Exam  Constitutional: He appears well-developed and well-nourished. No distress.  HENT:  Head: Normocephalic and atraumatic.  Mouth/Throat: Oropharynx is clear and moist. No oropharyngeal exudate.  Swelling of  the turbinates, clear oropharynx, no distress  Eyes: Conjunctivae and EOM are normal. Pupils are equal, round, and reactive to light. Right eye exhibits no discharge. Left eye exhibits no discharge. No scleral icterus.  Neck: Normal range of motion. Neck supple. No JVD present. No thyromegaly present.  Cardiovascular: Normal rate, regular rhythm, normal heart sounds and intact distal pulses.  Exam reveals no gallop and no friction rub.   No murmur heard. Pulmonary/Chest: Effort normal and breath sounds normal. No respiratory distress. He has no wheezes. He has no rales.  Clear lung sounds, no distress, no wheezing  Abdominal: Soft. Bowel sounds are normal. He  exhibits no distension and no mass. There is no tenderness.  Musculoskeletal: Normal range of motion. He exhibits no edema or tenderness.  Lymphadenopathy:    He has no cervical adenopathy.  Neurological: He is alert. Coordination normal.  Skin: Skin is warm and dry. No rash noted. No erythema.  Psychiatric: He has a normal mood and affect. His behavior is normal.  Nursing note and vitals reviewed.   ED Course  Procedures (including critical care time) Labs Review Labs Reviewed  CBC - Abnormal; Notable for the following:    WBC 12.1 (*)    All other components within normal limits  BASIC METABOLIC PANEL - Abnormal; Notable for the following:    Glucose, Bld 115 (*)    BUN 31 (*)    Creatinine, Ser 1.35 (*)    GFR calc non Af Amer 52 (*)    All other components within normal limits  Randolm Idol, ED    Imaging Review Dg Chest 2 View  03/26/2015  CLINICAL DATA:  Two week history of chest tightness. EXAM: CHEST  2 VIEW COMPARISON:  December 24, 2003 FINDINGS: There is no edema or consolidation. The heart size and pulmonary vascularity are normal. No pneumothorax. No adenopathy. No bone lesions. IMPRESSION: No edema or consolidation. Electronically Signed   By: Lowella Grip III M.D.   On: 03/26/2015 14:08   I have personally reviewed and evaluated these images and lab results as part of my medical decision-making.   EKG Interpretation   Date/Time:  Tuesday March 26 2015 13:45:49 EST Ventricular Rate:  85 PR Interval:  156 QRS Duration: 87 QT Interval:  350 QTC Calculation: 416 R Axis:   72 Text Interpretation:  Sinus rhythm since last tracing no significant  change Confirmed by Lorea Kupfer  MD, Rosmarie Esquibel ([id number]) on 03/26/2015 7:06:34 PM      MDM   Final diagnoses:  Nasal congestion    The patient is well-appearing, EKG is unremarkable, chest x-rays were unremarkable and labs show a slight leukocytosis and a slight renal insufficiency but otherwise unremarkable. The  patient feels very well at this time, he has no symptoms except for the nasal congestion, I will restart the medications that were prescribed for allergic rhinitis and have him follow-up with his family doctor, he is amenable to the plan.  Meds given in ED:  Medications - No data to display  Discharge Medication List as of 03/26/2015  7:24 PM    START taking these medications   Details  methylPREDNISolone (MEDROL DOSEPAK) 4 MG TBPK tablet Tapering dose over 6 days, Print            Noemi Chapel, MD 03/27/15 1030

## 2015-03-26 NOTE — ED Notes (Addendum)
Per pt, states SOB and chest tightness for a couple of weeks-saw PCP and was given meds-patient states they are not working-patient states nasal congestion causing symptoms

## 2015-03-26 NOTE — Discharge Instructions (Signed)

## 2015-03-26 NOTE — ED Notes (Signed)
MD at bedside. 

## 2015-03-26 NOTE — ED Provider Notes (Signed)
MSE.  Pt reports several weeks of SOB, nasal congestion, intermittent chest tightness.  No respiratory distress in triage, no active chest tightness.  CP protocol initiated.    Quintella Reichert, MD 03/26/15 1357

## 2015-03-27 NOTE — Progress Notes (Signed)
   Subjective:    Patient ID: Jack [name], male    DOB: [birth year], 69 y.o.   MRN: [id number]  HPI  Pt not seen, chart opened in error   Review of Systems     Objective:   Physical Exam        Assessment & Plan:

## 2015-03-27 NOTE — Assessment & Plan Note (Signed)
Pt not seen chart opened in error

## 2015-03-28 ENCOUNTER — Encounter (HOSPITAL_COMMUNITY): Payer: Self-pay | Admitting: Emergency Medicine

## 2015-03-28 ENCOUNTER — Emergency Department (HOSPITAL_COMMUNITY)
Admission: EM | Admit: 2015-03-28 | Discharge: 2015-03-28 | Disposition: A | Discharge status: Home / Self Care | Payer: Commercial Managed Care - HMO | Attending: Emergency Medicine | Admitting: Emergency Medicine

## 2015-03-28 DIAGNOSIS — I10 Essential (primary) hypertension: Secondary | ICD-10-CM | POA: Insufficient documentation

## 2015-03-28 DIAGNOSIS — R0602 Shortness of breath: Secondary | ICD-10-CM | POA: Diagnosis present

## 2015-03-28 DIAGNOSIS — Z79899 Other long term (current) drug therapy: Secondary | ICD-10-CM | POA: Diagnosis not present

## 2015-03-28 DIAGNOSIS — R0981 Nasal congestion: Secondary | ICD-10-CM

## 2015-03-28 DIAGNOSIS — R05 Cough: Secondary | ICD-10-CM | POA: Diagnosis not present

## 2015-03-28 DIAGNOSIS — E785 Hyperlipidemia, unspecified: Secondary | ICD-10-CM | POA: Diagnosis not present

## 2015-03-28 DIAGNOSIS — M4680 Other specified inflammatory spondylopathies, site unspecified: Secondary | ICD-10-CM | POA: Diagnosis not present

## 2015-03-28 DIAGNOSIS — G8929 Other chronic pain: Secondary | ICD-10-CM | POA: Insufficient documentation

## 2015-03-28 DIAGNOSIS — Z8719 Personal history of other diseases of the digestive system: Secondary | ICD-10-CM | POA: Insufficient documentation

## 2015-03-28 DIAGNOSIS — Z7952 Long term (current) use of systemic steroids: Secondary | ICD-10-CM | POA: Insufficient documentation

## 2015-03-28 DIAGNOSIS — Z8619 Personal history of other infectious and parasitic diseases: Secondary | ICD-10-CM | POA: Insufficient documentation

## 2015-03-28 MED ORDER — AMOXICILLIN 500 MG PO CAPS: 500.0000 mg | ORAL_CAPSULE | Freq: Three times a day (TID) | ORAL | Status: DC

## 2015-03-28 NOTE — Discharge Instructions (Signed)
Continue prednisone as prescribed by Dr. Willette Cluster with food so it does not upset your stomach.  Use nasal spray morning and evening.  Amoxicillin as prescribed.

## 2015-03-28 NOTE — ED Provider Notes (Signed)
CSN: [id number]     Arrival date & time 03/28/15  2054 History   First MD Initiated Contact with Patient 03/28/15 2103     Chief Complaint  Patient presents with  . Shortness of Breath      HPI  Presents right away should of nasal congestion difficulty breathing. He states he awakens at night and feels like he is "can't breathe through my nose". He awakened at night and has to sit up and cough are clear his throat. Getting clear nasal secretions (stopped. Prescribed prednisone by his primary care physician. Flonase and Zyrtec to the emergency room and a visit 2 days ago. No chest pain. Nonproductive cough. No hemoptysis. No fever.  Past Medical History  Diagnosis Date  . ALLERGIC RHINITIS 02/16/2007  . BACK PAIN 02/16/2007  . FOOT PAIN, CHRONIC 02/27/2008  . 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 HYPERLIPIDEMIA 02/16/2007  . HYPERTENSION 02/16/2007  . PROSTATE SPECIFIC ANTIGEN, ELEVATED 02/16/2007  . 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 Diverticulosis 09/27/2013   Past Surgical History  Procedure Laterality Date  . Foot surgery      reconstruction x's 4 after work accident   Family History  Problem Relation Age of Onset  . Cancer Mother     throat & lung  . Coronary artery disease Father   . Hypertension Father   . Cancer Sister   . Alcohol abuse Brother    Social History  Substance Use Topics  . Smoking status: Never Smoker   . Smokeless tobacco: Never Used  . Alcohol Use: No     Comment: no    Review of Systems  Constitutional: Negative for fever, chills, diaphoresis, appetite change and fatigue.  HENT: Positive for congestion. Negative for mouth sores, sore throat and trouble swallowing.   Eyes: Negative for visual disturbance.  Respiratory: Positive for cough and shortness of breath. Negative for chest tightness and wheezing.   Cardiovascular: Negative for chest pain.  Gastrointestinal: Negative for nausea, vomiting, abdominal pain, diarrhea and abdominal distention.   Endocrine: Negative for polydipsia, polyphagia and polyuria.  Genitourinary: Negative for dysuria, frequency and hematuria.  Musculoskeletal: Negative for gait problem.  Skin: Negative for color change, pallor and rash.  Neurological: Negative for dizziness, syncope, light-headedness and headaches.  Hematological: Does not bruise/bleed easily.  Psychiatric/Behavioral: Negative for behavioral problems and confusion.      Allergies  Review of patient's allergies indicates no known allergies.  Home Medications   Prior to Admission medications   Medication Sig Start Date End Date Taking? Authorizing Provider  atenolol (TENORMIN) 25 MG tablet Take 1 tablet (25 mg total) by mouth daily. 06/26/14  Yes Philemon Kingdom, MD  cetirizine (ZYRTEC) 10 MG tablet Take 1 tablet (10 mg total) by mouth daily. 03/26/15  Yes Noemi Chapel, MD  finasteride (PROSCAR) 5 MG tablet Take 1 tablet (5 mg total) by mouth daily. 09/27/13  Yes Biagio Borg, MD  lisinopril-hydrochlorothiazide (PRINZIDE,ZESTORETIC) 20-12.5 MG per tablet Take 1 tablet by mouth daily. 10/04/14  Yes Biagio Borg, MD  methylPREDNISolone (MEDROL DOSEPAK) 4 MG TBPK tablet Tapering dose over 6 days Patient taking differently: 4-24 mg. Tapering dose over 6 days. Take 6 tablets on Day 1, Take 5 tablets on Day 2, Take 4 tablets on Day 3, Take 3 tablets on Day 4, Take 2 tablets on Day 5 and Take 1 tablet on Day 6. 03/26/15  Yes Noemi Chapel, MD  simvastatin (ZOCOR) 40 MG tablet Take 1 tablet (40  mg total) by mouth daily at 6 PM. 10/18/14  Yes Biagio Borg, MD  triamcinolone (NASACORT AQ) 55 MCG/ACT AERO nasal inhaler Place 2 sprays into the nose daily. 03/26/15  Yes Noemi Chapel, MD  amoxicillin (AMOXIL) 500 MG capsule Take 1 capsule (500 mg total) by mouth 3 (three) times daily. 03/28/15   Tanna Furry, MD  clidinium-chlordiazePOXIDE (LIBRAX) 2.5-5 MG per capsule TAKE AS DIRECTED 08/21/11   Neena Rhymes, MD  predniSONE (DELTASONE) 10 MG tablet 3 tabs by  mouth per day for 3 days,2tabs per day for 3 days,1tab per day for 3 days Patient not taking: Reported on 03/28/2015 02/26/15   Biagio Borg, MD   BP 149/87 mmHg  Pulse 98  Temp(Src) 98.1 F (36.7 C) (Oral)  Resp 18  Ht 5\' 6"  (1.676 m)  Wt 198 lb (89.812 kg)  BMI 31.97 kg/m2  SpO2 99% Physical Exam  Constitutional: He is oriented to person, place, and time. He appears well-developed and well-nourished. No distress.  HENT:  Head: Normocephalic.  Congested naris  Eyes: Conjunctivae are normal. Pupils are equal, round, and reactive to light. No scleral icterus.  Neck: Normal range of motion. Neck supple. No thyromegaly present.  Cardiovascular: Normal rate and regular rhythm.  Exam reveals no gallop and no friction rub.   No murmur heard. Pulmonary/Chest: Effort normal and breath sounds normal. No respiratory distress. He has no wheezes. He has no rales.  Abdominal: Soft. Bowel sounds are normal. He exhibits no distension. There is no tenderness. There is no rebound.  Musculoskeletal: Normal range of motion.  Neurological: He is alert and oriented to person, place, and time.  Skin: Skin is warm and dry. No rash noted.  Psychiatric: He has a normal mood and affect. His behavior is normal.    ED Course  Procedures (including critical care time) Labs Review Labs Reviewed - No data to display  Imaging Review No results found. I have personally reviewed and evaluated these images and lab results as part of my medical decision-making.   EKG Interpretation None      MDM   Final diagnoses:  Sinus congestion    Patient with similar complaints as presentation 2 days ago. Had a normal x-ray at that time. Has clear lungs. Congestive nares. Not hypoxemic febrile Tachycardic. Prescribed amoxicillin. We'll continue the remainder is treatment for his nasal congestion with his prescribed prednisone, Flonase, Zyrtec.    Tanna Furry, MD 03/28/15 2259

## 2015-03-28 NOTE — ED Notes (Signed)
Per EMS patient from home c/o shortness of breath for weeks but states worsening over past few days.  Reports generalized weakness and tightness in chest.  Reports he was diagnosed with laryngitis a few weeks ago.

## 2015-03-28 NOTE — ED Notes (Signed)
Bed: [id number] Expected date:  Expected time:  Means of arrival:  Comments: EMS 69yo M shortness of breath; gen weakness

## 2015-03-28 NOTE — ED Notes (Signed)
EKG given to EDP,Allen,MD., for review. 

## 2015-03-30 ENCOUNTER — Encounter (HOSPITAL_COMMUNITY): Payer: Self-pay

## 2015-03-30 ENCOUNTER — Emergency Department (HOSPITAL_COMMUNITY)
Admission: EM | Admit: 2015-03-30 | Discharge: 2015-03-30 | Disposition: A | Discharge status: Home / Self Care | Payer: Commercial Managed Care - HMO | Attending: Emergency Medicine | Admitting: Emergency Medicine

## 2015-03-30 ENCOUNTER — Other Ambulatory Visit: Payer: Self-pay

## 2015-03-30 ENCOUNTER — Emergency Department (HOSPITAL_COMMUNITY): Payer: Commercial Managed Care - HMO

## 2015-03-30 DIAGNOSIS — E785 Hyperlipidemia, unspecified: Secondary | ICD-10-CM | POA: Insufficient documentation

## 2015-03-30 DIAGNOSIS — Z792 Long term (current) use of antibiotics: Secondary | ICD-10-CM | POA: Diagnosis not present

## 2015-03-30 DIAGNOSIS — I10 Essential (primary) hypertension: Secondary | ICD-10-CM | POA: Diagnosis not present

## 2015-03-30 DIAGNOSIS — Z8619 Personal history of other infectious and parasitic diseases: Secondary | ICD-10-CM | POA: Insufficient documentation

## 2015-03-30 DIAGNOSIS — Z8719 Personal history of other diseases of the digestive system: Secondary | ICD-10-CM | POA: Diagnosis not present

## 2015-03-30 DIAGNOSIS — R000 Tachycardia, unspecified: Secondary | ICD-10-CM | POA: Insufficient documentation

## 2015-03-30 DIAGNOSIS — R0600 Dyspnea, unspecified: Secondary | ICD-10-CM

## 2015-03-30 DIAGNOSIS — Z8709 Personal history of other diseases of the respiratory system: Secondary | ICD-10-CM | POA: Diagnosis not present

## 2015-03-30 DIAGNOSIS — G8929 Other chronic pain: Secondary | ICD-10-CM | POA: Diagnosis not present

## 2015-03-30 DIAGNOSIS — Z79899 Other long term (current) drug therapy: Secondary | ICD-10-CM | POA: Diagnosis not present

## 2015-03-30 DIAGNOSIS — Z7951 Long term (current) use of inhaled steroids: Secondary | ICD-10-CM | POA: Insufficient documentation

## 2015-03-30 DIAGNOSIS — N400 Enlarged prostate without lower urinary tract symptoms: Secondary | ICD-10-CM | POA: Diagnosis not present

## 2015-03-30 DIAGNOSIS — M1990 Unspecified osteoarthritis, unspecified site: Secondary | ICD-10-CM | POA: Diagnosis not present

## 2015-03-30 DIAGNOSIS — R1010 Upper abdominal pain, unspecified: Secondary | ICD-10-CM | POA: Diagnosis present

## 2015-03-30 LAB — COMPREHENSIVE METABOLIC PANEL
ALBUMIN: 3.7 g/dL (ref 3.5–5.0)
ALK PHOS: 67 U/L (ref 38–126)
ALT: 28 U/L (ref 17–63)
ANION GAP: 11 (ref 5–15)
AST: 36 U/L (ref 15–41)
BILIRUBIN TOTAL: 0.9 mg/dL (ref 0.3–1.2)
BUN: 32 mg/dL — ABNORMAL HIGH (ref 6–20)
CALCIUM: 9.3 mg/dL (ref 8.9–10.3)
CO2: 22 mmol/L (ref 22–32)
Chloride: 104 mmol/L (ref 101–111)
Creatinine, Ser: 1.5 mg/dL — ABNORMAL HIGH (ref 0.61–1.24)
GFR calc non Af Amer: 46 mL/min — ABNORMAL LOW (ref >60)
GFR, EST AFRICAN AMERICAN: 53 mL/min — AB (ref >60)
GLUCOSE: 150 mg/dL — AB (ref 65–99)
POTASSIUM: 3.8 mmol/L (ref 3.5–5.1)
SODIUM: 137 mmol/L (ref 135–145)
TOTAL PROTEIN: 7.3 g/dL (ref 6.5–8.1)

## 2015-03-30 LAB — CBC WITH DIFFERENTIAL/PLATELET
BASOS PCT: 0 %
Basophils Absolute: 0 10*3/uL (ref 0.0–0.1)
EOS ABS: 0.1 10*3/uL (ref 0.0–0.7)
Eosinophils Relative: 0 %
HCT: 40.5 % (ref 39.0–52.0)
Hemoglobin: 13.8 g/dL (ref 13.0–17.0)
LYMPHS ABS: 4 10*3/uL (ref 0.7–4.0)
Lymphocytes Relative: 29 %
MCH: 32.2 pg (ref 26.0–34.0)
MCHC: 34.1 g/dL (ref 30.0–36.0)
MCV: 94.6 fL (ref 78.0–100.0)
MONO ABS: 0.6 10*3/uL (ref 0.1–1.0)
MONOS PCT: 4 %
Neutro Abs: 9.2 10*3/uL — ABNORMAL HIGH (ref 1.7–7.7)
Neutrophils Relative %: 67 %
Platelets: 258 10*3/uL (ref 150–400)
RBC: 4.28 MIL/uL (ref 4.22–5.81)
RDW: 12.3 % (ref 11.5–15.5)
WBC: 13.8 10*3/uL — ABNORMAL HIGH (ref 4.0–10.5)

## 2015-03-30 LAB — TROPONIN I: Troponin I: <0.03 ng/mL (ref <0.031)

## 2015-03-30 MED ORDER — BARIUM SULFATE 2.1 % PO SUSP: 450.0000 mL | Freq: Once | ORAL | Status: DC

## 2015-03-30 MED ORDER — CIPROFLOXACIN HCL 500 MG PO TABS: 500.0000 mg | ORAL_TABLET | Freq: Two times a day (BID) | ORAL | Status: DC

## 2015-03-30 MED ORDER — IOHEXOL 300 MG/ML  SOLN
100.0000 mL | Freq: Once | INTRAMUSCULAR | Status: AC | PRN
  Administered 2015-03-30: 100 mL via INTRAVENOUS

## 2015-03-30 MED ORDER — BARIUM SULFATE 2.1 % PO SUSP
ORAL | Status: AC
  Filled 2015-03-30: qty 1

## 2015-03-30 NOTE — ED Notes (Addendum)
Pt verbalized understanding of d/c instructions, prescriptions, and follow-up care. Also discussed d/c instructions w/ pt son per pt request because pt states he might need his son to remind him about taking abx. Son verbalized d/c instructions and follow-up care. No further questions/concerns, VSS, ambulatory w/ steady gait (refused wheelchair).

## 2015-03-30 NOTE — ED Provider Notes (Signed)
CSN: [id number]     Arrival date & time 03/30/15  0241 History   First MD Initiated Contact with Patient 03/30/15 0315     Chief Complaint  Patient presents with  . Tachycardia  . Shortness of Breath      HPI Patient reports that he awoke and had discomfort and pain in his upper and lower abdomen.  This pain was exquisite and thus it made him feel somewhat short of breath secondary to pain.  He denies chest pain.  His wife reports he was frantic stating he was having a hard time breathing and EMS was contacted.  He reports on transport to the emergency department the patient began feeling much better.  He denies chest pain shortness of breath at this time.  He reports that he has been managing in dealing with lower abdominal pain over the past 3 weeks.  Has had some intermittent issues with urinary flow.  He denies fevers chills.  Denies nausea and vomiting.  No diarrhea.  No upper or lower back pain.  Symptoms are moderate in severity.  No other complaints.   Past Medical History  Diagnosis Date  . ALLERGIC RHINITIS 02/16/2007  . BACK PAIN 02/16/2007  . FOOT PAIN, CHRONIC 02/27/2008  . 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 HYPERLIPIDEMIA 02/16/2007  . HYPERTENSION 02/16/2007  . PROSTATE SPECIFIC ANTIGEN, ELEVATED 02/16/2007  . 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 Diverticulosis 09/27/2013   Past Surgical History  Procedure Laterality Date  . Foot surgery      reconstruction x's 4 after work accident   Family History  Problem Relation Age of Onset  . Cancer Mother     throat & lung  . Coronary artery disease Father   . Hypertension Father   . Cancer Sister   . Alcohol abuse Brother    Social History  Substance Use Topics  . Smoking status: Never Smoker   . Smokeless tobacco: Never Used  . Alcohol Use: No     Comment: no    Review of Systems  All other systems reviewed and are negative.     Allergies  Review of patient's allergies indicates no known allergies.  Home  Medications   Prior to Admission medications   Medication Sig Start Date End Date Taking? Authorizing Provider  amoxicillin (AMOXIL) 500 MG capsule Take 1 capsule (500 mg total) by mouth 3 (three) times daily. 03/28/15  Yes Tanna Furry, MD  atenolol (TENORMIN) 25 MG tablet Take 1 tablet (25 mg total) by mouth daily. 06/26/14  Yes Philemon Kingdom, MD  cetirizine (ZYRTEC) 10 MG tablet Take 1 tablet (10 mg total) by mouth daily. 03/26/15  Yes Noemi Chapel, MD  clidinium-chlordiazePOXIDE (LIBRAX) 2.5-5 MG per capsule TAKE AS DIRECTED 08/21/11  Yes Neena Rhymes, MD  finasteride (PROSCAR) 5 MG tablet Take 1 tablet (5 mg total) by mouth daily. 09/27/13  Yes Biagio Borg, MD  lisinopril-hydrochlorothiazide (PRINZIDE,ZESTORETIC) 20-12.5 MG per tablet Take 1 tablet by mouth daily. 10/04/14  Yes Biagio Borg, MD  methylPREDNISolone (MEDROL DOSEPAK) 4 MG TBPK tablet Tapering dose over 6 days Patient taking differently: 4-24 mg. Tapering dose over 6 days. Take 6 tablets on Day 1, Take 5 tablets on Day 2, Take 4 tablets on Day 3, Take 3 tablets on Day 4, Take 2 tablets on Day 5 and Take 1 tablet on Day 6. 03/26/15  Yes Noemi Chapel, MD  simvastatin (ZOCOR) 40 MG tablet Take 1 tablet (40 mg  total) by mouth daily at 6 PM. 10/18/14  Yes Biagio Borg, MD  triamcinolone (NASACORT AQ) 55 MCG/ACT AERO nasal inhaler Place 2 sprays into the nose daily. 03/26/15  Yes Noemi Chapel, MD  predniSONE (DELTASONE) 10 MG tablet 3 tabs by mouth per day for 3 days,2tabs per day for 3 days,1tab per day for 3 days Patient not taking: Reported on 03/28/2015 02/26/15   Biagio Borg, MD   BP 118/78 mmHg  Pulse 95  Temp(Src) 97.9 F (36.6 C)  Resp 20  SpO2 97% Physical Exam  Constitutional: He is oriented to person, place, and time. He appears well-developed and well-nourished.  HENT:  Head: Normocephalic and atraumatic.  Eyes: EOM are normal.  Neck: Normal range of motion.  Cardiovascular: Normal rate, regular rhythm, normal heart  sounds and intact distal pulses.   Pulmonary/Chest: Effort normal and breath sounds normal. No respiratory distress.  Abdominal: Soft. He exhibits no distension.  Right lower quadrant tenderness with guarding.  No rebound  Musculoskeletal: Normal range of motion.  Neurological: He is alert and oriented to person, place, and time.  Skin: Skin is warm and dry.  Psychiatric: He has a normal mood and affect. Judgment normal.  Nursing note and vitals reviewed.   ED Course  Procedures (including critical care time) Labs Review Labs Reviewed  CBC WITH DIFFERENTIAL/PLATELET - Abnormal; Notable for the following:    WBC 13.8 (*)    Neutro Abs 9.2 (*)    All other components within normal limits  COMPREHENSIVE METABOLIC PANEL - Abnormal; Notable for the following:    Glucose, Bld 150 (*)    BUN 32 (*)    Creatinine, Ser 1.50 (*)    GFR calc non Af Amer 46 (*)    GFR calc Af Amer 53 (*)    All other components within normal limits  TROPONIN I    Imaging Review Ct Abdomen Pelvis W Contrast  03/30/2015  CLINICAL DATA:  Periumbilical pain radiating into the right lower quadrant EXAM: CT ABDOMEN AND PELVIS WITH CONTRAST TECHNIQUE: Multidetector CT imaging of the abdomen and pelvis was performed using the standard protocol following bolus administration of intravenous contrast. CONTRAST:  156mL OMNIPAQUE IOHEXOL 300 MG/ML  SOLN COMPARISON:  None. FINDINGS: The appendix is normal. Remainder the bowel is remarkable only for mild uncomplicated colonic diverticulosis. There are normal appearances of the liver, gallbladder, pancreas, spleen, adrenals and kidneys. The abdominal aorta is normal in caliber with mild atherosclerotic calcification. There is marked distention of the urinary bladder, nearly to the level of the umbilicus. There is marked prostatic enlargement. There is no significant abnormality in the lower chest except for mild linear scarring or atelectasis posteriorly. No significant  musculoskeletal lesion. IMPRESSION: 1. Normal appendix. No acute inflammatory changes in the abdomen or pelvis. 2. Distention of the urinary bladder, nearly to the level of the umbilicus. Marked prostatic enlargement. Electronically Signed   By: Andreas Newport M.D.   On: 03/30/2015 06:45   I have personally reviewed and evaluated these images and lab results as part of my medical decision-making.  ECG interpretation   Date: 03/30/2015  Rate: 101  Rhythm: normal sinus rhythm  QRS Axis: normal  Intervals: normal  ST/T Wave abnormalities: normal  Conduction Disutrbances: none  Narrative Interpretation:   Old EKG Reviewed: No significant changes noted     MDM   Final diagnoses:  None    Patient feels much better in the emergency department this time.  His breathing is  back to baseline and he has no complaints at this time.  In regards to his ongoing abdominal pain over the past several weeks a CT scan was obtained which demonstrates prostate hypertrophy as well as bladder distention without other acute abdominal/pelvic pathology.  Patient is able to void in the emergency department and his postvoid residual is 50 cc of urine.  He states these symptoms that prompted his visit emergency department today with abdominal pain is similar to his prior prostatitis.  Previously he states he responded well to antibiotics.  No other significant abnormalities noted on workup.  Discharge home with primary care follow-up.  Urology follow-up.  Patient be placed on a ten-day course of ciprofloxacin.    Jola Schmidt, MD 03/30/15 [phone]

## 2015-03-30 NOTE — ED Notes (Signed)
Pt arrived via [facility] from home c/o tachycardia and SOB starting today.  Pt denies any chest pain.  Was diagnosed with sinus congestion at [facility] on 03-28-15.

## 2015-04-02 ENCOUNTER — Emergency Department (HOSPITAL_COMMUNITY): Payer: Commercial Managed Care - HMO

## 2015-04-02 ENCOUNTER — Encounter (HOSPITAL_COMMUNITY): Payer: Self-pay | Admitting: Emergency Medicine

## 2015-04-02 ENCOUNTER — Ambulatory Visit (INDEPENDENT_AMBULATORY_CARE_PROVIDER_SITE_OTHER): Payer: Commercial Managed Care - HMO | Admitting: Internal Medicine

## 2015-04-02 ENCOUNTER — Emergency Department (HOSPITAL_COMMUNITY)
Admission: EM | Admit: 2015-04-02 | Discharge: 2015-04-02 | Disposition: A | Discharge status: Home / Self Care | Payer: Commercial Managed Care - HMO | Attending: Emergency Medicine | Admitting: Emergency Medicine

## 2015-04-02 ENCOUNTER — Telehealth: Payer: Self-pay | Admitting: Internal Medicine

## 2015-04-02 ENCOUNTER — Encounter: Payer: Self-pay | Admitting: Internal Medicine

## 2015-04-02 VITALS — BP 110/68 | HR 83 | Temp 98.4°F | Ht 66.0 in | Wt 168.2 lb

## 2015-04-02 DIAGNOSIS — G8929 Other chronic pain: Secondary | ICD-10-CM | POA: Diagnosis not present

## 2015-04-02 DIAGNOSIS — I712 Thoracic aortic aneurysm, without rupture, unspecified: Secondary | ICD-10-CM | POA: Insufficient documentation

## 2015-04-02 DIAGNOSIS — I10 Essential (primary) hypertension: Secondary | ICD-10-CM | POA: Diagnosis not present

## 2015-04-02 DIAGNOSIS — M1990 Unspecified osteoarthritis, unspecified site: Secondary | ICD-10-CM | POA: Insufficient documentation

## 2015-04-02 DIAGNOSIS — R0600 Dyspnea, unspecified: Secondary | ICD-10-CM | POA: Diagnosis not present

## 2015-04-02 DIAGNOSIS — I7789 Other specified disorders of arteries and arterioles: Secondary | ICD-10-CM | POA: Diagnosis not present

## 2015-04-02 DIAGNOSIS — Z79899 Other long term (current) drug therapy: Secondary | ICD-10-CM | POA: Diagnosis not present

## 2015-04-02 DIAGNOSIS — E785 Hyperlipidemia, unspecified: Secondary | ICD-10-CM | POA: Insufficient documentation

## 2015-04-02 DIAGNOSIS — J309 Allergic rhinitis, unspecified: Secondary | ICD-10-CM | POA: Diagnosis not present

## 2015-04-02 DIAGNOSIS — R0602 Shortness of breath: Secondary | ICD-10-CM | POA: Diagnosis present

## 2015-04-02 DIAGNOSIS — Z8619 Personal history of other infectious and parasitic diseases: Secondary | ICD-10-CM | POA: Insufficient documentation

## 2015-04-02 LAB — TROPONIN I: Troponin I: <0.03 ng/mL (ref <0.031)

## 2015-04-02 LAB — CBC
HEMATOCRIT: 42.1 % (ref 39.0–52.0)
HEMOGLOBIN: 14.4 g/dL (ref 13.0–17.0)
MCH: 32 pg (ref 26.0–34.0)
MCHC: 34.2 g/dL (ref 30.0–36.0)
MCV: 93.6 fL (ref 78.0–100.0)
Platelets: 229 10*3/uL (ref 150–400)
RBC: 4.5 MIL/uL (ref 4.22–5.81)
RDW: 12.2 % (ref 11.5–15.5)
WBC: 9.8 10*3/uL (ref 4.0–10.5)

## 2015-04-02 LAB — I-STAT TROPONIN, ED: Troponin i, poc: 0 ng/mL (ref 0.00–0.08)

## 2015-04-02 LAB — D-DIMER, QUANTITATIVE: D-Dimer, Quant: 0.68 ug/mL-FEU — ABNORMAL HIGH (ref 0.00–0.50)

## 2015-04-02 LAB — BASIC METABOLIC PANEL
ANION GAP: 8 (ref 5–15)
BUN: 25 mg/dL — AB (ref 6–20)
CHLORIDE: 102 mmol/L (ref 101–111)
CO2: 23 mmol/L (ref 22–32)
Calcium: 9.4 mg/dL (ref 8.9–10.3)
Creatinine, Ser: 1.32 mg/dL — ABNORMAL HIGH (ref 0.61–1.24)
GFR calc Af Amer: >60 mL/min (ref >60)
GFR calc non Af Amer: 53 mL/min — ABNORMAL LOW (ref >60)
GLUCOSE: 140 mg/dL — AB (ref 65–99)
POTASSIUM: 3.7 mmol/L (ref 3.5–5.1)
Sodium: 133 mmol/L — ABNORMAL LOW (ref 135–145)

## 2015-04-02 MED ORDER — IOHEXOL 350 MG/ML SOLN
100.0000 mL | Freq: Once | INTRAVENOUS | Status: AC | PRN
  Administered 2015-04-02: 80 mL via INTRAVENOUS

## 2015-04-02 MED ORDER — IPRATROPIUM-ALBUTEROL 20-100 MCG/ACT IN AERS: 1.0000 | INHALATION_SPRAY | Freq: Four times a day (QID) | RESPIRATORY_TRACT | Status: DC

## 2015-04-02 MED ORDER — ALBUTEROL SULFATE HFA 108 (90 BASE) MCG/ACT IN AERS: 2.0000 | INHALATION_SPRAY | Freq: Four times a day (QID) | RESPIRATORY_TRACT | Status: DC | PRN

## 2015-04-02 NOTE — Patient Instructions (Signed)
Please take all new medication as prescribed - the Combivent Inhaler  Please continue all other medications as before, and refills have been done if requested.  Please have the pharmacy call with any other refills you may need.  Please keep your appointments with your specialists as you may have planned  You will be contacted regarding the referral for: Echocardiogram, Cardiology, and Pulmonary Function Testing (lung breathing test)  Please return in 1 months, or sooner if needed

## 2015-04-02 NOTE — ED Provider Notes (Signed)
CSN: [id number]     Arrival date & time 04/02/15  0059 History   First MD Initiated Contact with Patient 04/02/15 0316     Chief Complaint  Patient presents with  . Shortness of Breath      HPI Patient presents to the emergency department complaining of shortness of breath.  He was seen in emergency department several days ago for similar symptoms.  He states through the weekend he was doing better but then this evening he began feeling short of breath again.  He denies orthopnea.  No fevers or chills.  Denies productive cough.  No history of asthma or COPD.  He denies chest discomfort or pain.  Denies abdominal pain.  No nausea or vomiting.  Denies melena or hematochezia.  Past Medical History  Diagnosis Date  . ALLERGIC RHINITIS 02/16/2007  . BACK PAIN 02/16/2007  . FOOT PAIN, CHRONIC 02/27/2008  . 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 HYPERLIPIDEMIA 02/16/2007  . HYPERTENSION 02/16/2007  . PROSTATE SPECIFIC ANTIGEN, ELEVATED 02/16/2007  . 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 Diverticulosis 09/27/2013   Past Surgical History  Procedure Laterality Date  . Foot surgery      reconstruction x's 4 after work accident   Family History  Problem Relation Age of Onset  . Cancer Mother     throat & lung  . Coronary artery disease Father   . Hypertension Father   . Cancer Sister   . Alcohol abuse Brother    Social History  Substance Use Topics  . Smoking status: Never Smoker   . Smokeless tobacco: Never Used  . Alcohol Use: No     Comment: no    Review of Systems  All other systems reviewed and are negative.     Allergies  Review of patient's allergies indicates no known allergies.  Home Medications   Prior to Admission medications   Medication Sig Start Date End Date Taking? Authorizing Provider  amoxicillin (AMOXIL) 500 MG capsule Take 1 capsule (500 mg total) by mouth 3 (three) times daily. 03/28/15  Yes Tanna Furry, MD  atenolol (TENORMIN) 25 MG tablet Take 1 tablet (25 mg total)  by mouth daily. 06/26/14  Yes Philemon Kingdom, MD  cetirizine (ZYRTEC) 10 MG tablet Take 1 tablet (10 mg total) by mouth daily. 03/26/15  Yes Noemi Chapel, MD  ciprofloxacin (CIPRO) 500 MG tablet Take 1 tablet (500 mg total) by mouth 2 (two) times daily. 03/30/15  Yes Jola Schmidt, MD  clidinium-chlordiazePOXIDE (LIBRAX) 2.5-5 MG per capsule TAKE AS DIRECTED 08/21/11  Yes Neena Rhymes, MD  finasteride (PROSCAR) 5 MG tablet Take 1 tablet (5 mg total) by mouth daily. 09/27/13  Yes Biagio Borg, MD  lisinopril-hydrochlorothiazide (PRINZIDE,ZESTORETIC) 20-12.5 MG per tablet Take 1 tablet by mouth daily. 10/04/14  Yes Biagio Borg, MD  methylPREDNISolone (MEDROL DOSEPAK) 4 MG TBPK tablet Tapering dose over 6 days Patient taking differently: 4-24 mg. Tapering dose over 6 days. Take 6 tablets on Day 1, Take 5 tablets on Day 2, Take 4 tablets on Day 3, Take 3 tablets on Day 4, Take 2 tablets on Day 5 and Take 1 tablet on Day 6. 03/26/15  Yes Noemi Chapel, MD  simvastatin (ZOCOR) 40 MG tablet Take 1 tablet (40 mg total) by mouth daily at 6 PM. 10/18/14  Yes Biagio Borg, MD  triamcinolone (NASACORT AQ) 55 MCG/ACT AERO nasal inhaler Place 2 sprays into the nose daily. 03/26/15  Yes Noemi Chapel, MD  BP 150/84 mmHg  Pulse 104  Temp(Src) 97.5 F (36.4 C) (Oral)  Resp 20  Ht 5\' 6"  (1.676 m)  Wt 160 lb (72.576 kg)  BMI 25.84 kg/m2  SpO2 93% Physical Exam  Constitutional: He is oriented to person, place, and time. He appears well-developed and well-nourished.  HENT:  Head: Normocephalic and atraumatic.  Eyes: EOM are normal.  Neck: Normal range of motion.  Cardiovascular: Normal rate, regular rhythm, normal heart sounds and intact distal pulses.   Pulmonary/Chest: Effort normal and breath sounds normal. No respiratory distress.  Abdominal: Soft. He exhibits no distension. There is no tenderness.  Musculoskeletal: Normal range of motion. He exhibits no edema.  Neurological: He is alert and oriented to  person, place, and time.  Skin: Skin is warm and dry.  Psychiatric: He has a normal mood and affect. Judgment normal.  Nursing note and vitals reviewed.   ED Course  Procedures (including critical care time) Labs Review Labs Reviewed  BASIC METABOLIC PANEL - Abnormal; Notable for the following:    Sodium 133 (*)    Glucose, Bld 140 (*)    BUN 25 (*)    Creatinine, Ser 1.32 (*)    GFR calc non Af Amer 53 (*)    All other components within normal limits  D-DIMER, QUANTITATIVE (NOT AT [facility]) - Abnormal; Notable for the following:    D-Dimer, Quant 0.68 (*)    All other components within normal limits  CBC  TROPONIN I  I-STAT TROPOININ, ED    Imaging Review Dg Chest 2 View  04/02/2015  CLINICAL DATA:  Initial evaluation for acute shortness of breath. EXAM: CHEST  2 VIEW COMPARISON:  Prior radiograph from 03/26/2015. FINDINGS: The cardiac and mediastinal silhouettes are stable in size and contour, and remain within normal limits. The lungs are normally inflated. No airspace consolidation, pleural effusion, or pulmonary edema is identified. There is no pneumothorax. No acute osseous abnormality identified. IMPRESSION: No active cardiopulmonary disease. Electronically Signed   By: Jeannine Boga M.D.   On: 04/02/2015 02:30   Ct Angio Chest Pe W/cm &/or Wo Cm  04/02/2015  CLINICAL DATA:  69 year old male with shortness of breath and abnormal D-dimer. Initial encounter. EXAM: CT ANGIOGRAPHY CHEST WITH CONTRAST TECHNIQUE: Multidetector CT imaging of the chest was performed using the standard protocol during bolus administration of intravenous contrast. Multiplanar CT image reconstructions and MIPs were obtained to evaluate the vascular anatomy. CONTRAST:  74mL OMNIPAQUE IOHEXOL 350 MG/ML SOLN COMPARISON:  Chest radiographs 0138 hours today, and earlier. CT Abdomen and Pelvis 03/30/2015. FINDINGS: Good contrast bolus timing in the pulmonary arterial tree. Mild lower lobe respiratory motion  artifact. No focal filling defect identified in the pulmonary arterial tree to suggest the presence of acute pulmonary embolism. Major airways are patent. Linear atelectasis or scarring in both lower lobes is unchanged from the recent CT Abdomen and Pelvis. No pleural effusion. No other abnormal pulmonary opacity. No pericardial effusion. Negative thoracic inlet. Mild fusiform enlargement of the ascending aorta up to 40-43 mm diameter. The aorta tapers in the arch to 30 mm. No significant thoracic aortic atherosclerosis. Negative great vessel origins. Negative descending thoracic aorta. No definite coronary artery calcification. No mediastinal or hilar lymphadenopathy. Negative visualized abdominal aorta. No axillary lymphadenopathy. Stable and negative visualized upper abdomen. No acute osseous abnormality identified. Review of the MIP images confirms the above findings. IMPRESSION: 1.  No evidence of acute pulmonary embolus. 2. 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  No significant aortic atherosclerosis. Recommend annual imaging followup by CTA or MRA. This recommendation follows 2010 ACCF/AHA/AATS/ACR/ASA/SCA/SCAI/SIR/STS/SVM Guidelines for the Diagnosis and Management of Patients with Thoracic Aortic Disease. Circulation. 2010; 121: [id number] 3. Negative lungs aside from stable mild linear scarring or atelectasis in the lower lobes. Electronically Signed   By: Genevie Ann M.D.   On: 04/02/2015 07:24   I have personally reviewed and evaluated these images and lab results as part of my medical decision-making.   EKG Interpretation   Date/Time:  Tuesday April 02 2015 01:11:16 EST Ventricular Rate:  112 PR Interval:  140 QRS Duration: 82 QT Interval:  314 QTC Calculation: 428 R Axis:   88 Text Interpretation:  Sinus tachycardia Right atrial enlargement  Borderline ECG No significant change was found Confirmed by Ahnika Hannibal  MD,  Deshunda Thackston ([id number]) on 04/02/2015 4:54:42 AM       MDM   Final diagnoses:  Dyspnea    Patient was informed of the noted enlargement of his ascending aorta.  He understands that he will need follow-up imaging.  He can follow-up with his primary care physician for additional recommendations regarding his aortic enlargement.  No clear etiology again was found for his dyspnea.  He has no hypoxia.  He had mild elevation of his d-dimer but CT angiogram demonstrates no evidence of pulmonary embolism.  He may need outpatient echocardiogram.  Also refer the patient to pulmonology for outpatient follow-up of his complaint of dyspnea.  At this time I do not think additional workup needs to be done in the emergency department or in the hospital acutely.  He is scheduled to see his primary care physician later this morning.  Vas that he keep that appointment and discuss this further with his primary care physician.    Jola Schmidt, MD 04/02/15 [phone]

## 2015-04-02 NOTE — ED Notes (Signed)
Pt verbalized understanding of d/c instructions and follow-up care. No further questions/concerns, VSS, ambulatory w/ steady gait (refused wheelchair) 

## 2015-04-02 NOTE — Assessment & Plan Note (Signed)
Improved on the zyrtec,  to f/u any worsening symptoms or concerns

## 2015-04-02 NOTE — Telephone Encounter (Signed)
Ok to try change to albuterol HFA MDI - done erx  But please let them know that all inhalers are currently brand name, and may cost more than prior generic meds taken off the market a few years ago

## 2015-04-02 NOTE — Telephone Encounter (Signed)
Tried calling pt twice line keep being busy...Jack [name]

## 2015-04-02 NOTE — Assessment & Plan Note (Signed)
Clinical significance not clear, but will need Echo to assess aortic root/aortic valve, and cardiology referral

## 2015-04-02 NOTE — Assessment & Plan Note (Signed)
stable overall by history and exam, recent data reviewed with pt, and pt to continue medical treatment as before,  to f/u any worsening symptoms or concerns BP Readings from Last 3 Encounters:  04/02/15 110/68  04/02/15 142/92  03/30/15 116/84

## 2015-04-02 NOTE — Telephone Encounter (Signed)
Pt informed. Pt also states he talked with you about his anxiety today at his appt and is requesting a prescription for that.

## 2015-04-02 NOTE — Assessment & Plan Note (Addendum)
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 Asthma vs underlying copd, for combivent inhaler tria, for PFT's, consider pulm referral as well but declines for now  Note:  Total time for pt hx, exam, review of record with pt in the room, determination of diagnoses and plan for further eval and tx is > 40 min, with over 50% spent in coordination and counseling of patient

## 2015-04-02 NOTE — Progress Notes (Signed)
Pre visit review using our clinic review tool, if applicable. No additional management support is needed unless otherwise documented below in the visit note. 

## 2015-04-02 NOTE — Discharge Instructions (Signed)

## 2015-04-02 NOTE — ED Notes (Signed)
Pt. reports worsening SOB worse when lying on bed onset this week , denies cough or congestion , no fever or chills.

## 2015-04-02 NOTE — ED Notes (Signed)
Patient transported to CT 

## 2015-04-02 NOTE — ED Notes (Signed)
Dr. Campos at the bedside.  

## 2015-04-02 NOTE — Telephone Encounter (Signed)
Pt wife called in and said that the inhaler was $320.00 and they can not afford that.  Can this be changed to something they can afford?     Best number [phone]  Pharmacy [facility] on file

## 2015-04-02 NOTE — Progress Notes (Signed)
Subjective:    Patient ID: Jack [name], male    DOB: May 30, 1945, 69 y.o.   MRN: [id number]  HPI  Here to f/u, a few hours after seen at ED earlier today.  Asked to f/u here after ED eval for dyspnea disclosed evidence for ascending aortic enlargement otherwise of unclear clinical significance.  Pt is poor historian and some HOH, but states has been sob/doe at least mild, occas mod for over 1 month, incidentally improved with medrol dose pack recent, then worse again in the past 1 - 2 wks  Non smoker for 30 yrs, wife smokes daily.  He's not sure what wheezing is, but does not think so, though wife states his sob will wake him up at night most nights.  No hx of COPD or inhaler trial. Pt denies chest pain, orthopnea, PND, increased LE swelling, palpitations, dizziness or syncope.  No other specific finding of etiology in ED - cbc normal, no PE by CTA chest, no volume overload and pt remains afeb. Past Medical History  Diagnosis Date  . ALLERGIC RHINITIS 02/16/2007  . BACK PAIN 02/16/2007  . FOOT PAIN, CHRONIC 02/27/2008  . 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 HYPERLIPIDEMIA 02/16/2007  . HYPERTENSION 02/16/2007  . PROSTATE SPECIFIC ANTIGEN, ELEVATED 02/16/2007  . 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 Diverticulosis 09/27/2013   Past Surgical History  Procedure Laterality Date  . Foot surgery      reconstruction x's 4 after work accident    reports that he has never smoked. He has never used smokeless tobacco. He reports that he does not drink alcohol or use illicit drugs. family history includes Alcohol abuse in his brother; Cancer in his mother and sister; Coronary artery disease in his father; Hypertension in his father. No Known Allergies Current Outpatient Prescriptions on File Prior to Visit  Medication Sig Dispense Refill  . amoxicillin (AMOXIL) 500 MG capsule Take 1 capsule (500 mg total) by mouth 3 (three) times daily. 21 capsule 0  . atenolol (TENORMIN) 25 MG tablet Take 1 tablet (25 mg total)  by mouth daily. 90 tablet 3  . cetirizine (ZYRTEC) 10 MG tablet Take 1 tablet (10 mg total) by mouth daily. 30 tablet 11  . ciprofloxacin (CIPRO) 500 MG tablet Take 1 tablet (500 mg total) by mouth 2 (two) times daily. 14 tablet 0  . clidinium-chlordiazePOXIDE (LIBRAX) 2.5-5 MG per capsule TAKE AS DIRECTED 30 capsule 1  . finasteride (PROSCAR) 5 MG tablet Take 1 tablet (5 mg total) by mouth daily. 30 tablet 11  . lisinopril-hydrochlorothiazide (PRINZIDE,ZESTORETIC) 20-12.5 MG per tablet Take 1 tablet by mouth daily. 30 tablet 11  . methylPREDNISolone (MEDROL DOSEPAK) 4 MG TBPK tablet Tapering dose over 6 days (Patient taking differently: 4-24 mg. Tapering dose over 6 days. Take 6 tablets on Day 1, Take 5 tablets on Day 2, Take 4 tablets on Day 3, Take 3 tablets on Day 4, Take 2 tablets on Day 5 and Take 1 tablet on Day 6.) 21 tablet 0  . simvastatin (ZOCOR) 40 MG tablet Take 1 tablet (40 mg total) by mouth daily at 6 PM. 90 tablet 3  . triamcinolone (NASACORT AQ) 55 MCG/ACT AERO nasal inhaler Place 2 sprays into the nose daily. 1 Inhaler 12   No current facility-administered medications on file prior to visit.    Review of Systems  Constitutional: Negative for unusual diaphoresis or night sweats HENT: Negative for ringing in ear or discharge Eyes: Negative for  double vision or worsening visual disturbance.  Respiratory: Negative for choking and stridor.   Gastrointestinal: Negative for vomiting or other signifcant bowel change Genitourinary: Negative for hematuria or change in urine volume.  Musculoskeletal: Negative for other MSK pain or swelling Skin: Negative for color change and worsening wound.  Neurological: Negative for tremors and numbness other than noted  Psychiatric/Behavioral: Negative for decreased concentration or agitation other than above       Objective:   Physical Exam BP 110/68 mmHg  Pulse 83  Temp(Src) 98.4 F (36.9 C) (Oral)  Ht 5\' 6"  (1.676 m)  Wt 168 lb 4 oz  (76.318 kg)  BMI 27.17 kg/m2  SpO2 97% VS noted,  Constitutional: Pt appears in no significant distress HENT: Head: NCAT.  Right Ear: External ear normal.  Left Ear: External ear normal.  Eyes: . Pupils are equal, round, and reactive to light. Conjunctivae and EOM are normal Neck: Normal range of motion. Neck supple.  Cardiovascular: Normal rate and regular rhythm.   Pulmonary/Chest: Effort normal and breath sounds decreased bilat without rales or wheezing.  Abd:  Soft, NT, ND, + BS Neurological: Pt is alert. Not confused , motor grossly intact Skin: Skin is warm. No rash, no LE edema Psychiatric: Pt behavior is normal. No agitation.  Lab Results  Component Value Date   WBC 9.8 04/02/2015   HGB 14.4 04/02/2015   HCT 42.1 04/02/2015   PLT 229 04/02/2015   GLUCOSE 140* 04/02/2015   CHOL 177 10/04/2014   TRIG 202.0* 10/04/2014   HDL 46.40 10/04/2014   LDLDIRECT 86.0 10/04/2014   LDLCALC 73 09/27/2013   ALT 28 03/30/2015   AST 36 03/30/2015   NA 133* 04/02/2015   K 3.7 04/02/2015   CL 102 04/02/2015   CREATININE 1.32* 04/02/2015   BUN 25* 04/02/2015   CO2 23 04/02/2015   TSH 0.25* 10/04/2014   PSA 3.01 10/04/2014   CTA chest: IMPRESSION: 1. No evidence of acute pulmonary embolus. 2. 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 No significant aortic atherosclerosis. Recommend annual imaging followup by CTA or MRA. This recommendation follows 2010 ACCF/AHA/AATS/ACR/ASA/SCA/SCAI/SIR/STS/SVM Guidelines for the Diagnosis and Management of Patients with Thoracic Aortic Disease. Circulation. 2010; 121: [id number] 3. Negative lungs aside from stable mild linear scarring or atelectasis in the lower lobes.  CXR:  IMPRESSION: No active cardiopulmonary disease.  ECG: Sinus tachycardia Right atrial enlargement Borderline ECG No significant change was found Confirmed by CAMPOS MD, KEVIN ([id number]) on 04/02/2015 4:54:42 AM    Assessment & Plan:

## 2015-04-03 MED ORDER — HYDROXYZINE HCL 25 MG PO TABS: 25.0000 mg | ORAL_TABLET | Freq: Three times a day (TID) | ORAL | Status: DC | PRN

## 2015-04-03 NOTE — Telephone Encounter (Signed)
[place] for vistaril tid prn - done erx

## 2015-04-04 NOTE — Telephone Encounter (Signed)
Pt's spouse advised in detail

## 2015-04-09 ENCOUNTER — Observation Stay (HOSPITAL_COMMUNITY)
Admission: EM | Admit: 2015-04-09 | Discharge: 2015-04-11 | Disposition: A | Discharge status: Home / Self Care | Payer: Commercial Managed Care - HMO | Attending: Internal Medicine | Admitting: Internal Medicine

## 2015-04-09 ENCOUNTER — Encounter (HOSPITAL_COMMUNITY): Payer: Self-pay | Admitting: Emergency Medicine

## 2015-04-09 ENCOUNTER — Ambulatory Visit (INDEPENDENT_AMBULATORY_CARE_PROVIDER_SITE_OTHER): Payer: Commercial Managed Care - HMO | Admitting: Cardiology

## 2015-04-09 ENCOUNTER — Emergency Department (HOSPITAL_COMMUNITY): Payer: Commercial Managed Care - HMO

## 2015-04-09 ENCOUNTER — Encounter: Payer: Self-pay | Admitting: Cardiology

## 2015-04-09 VITALS — BP 138/74 | HR 78 | Ht 66.0 in | Wt 166.8 lb

## 2015-04-09 DIAGNOSIS — Z79899 Other long term (current) drug therapy: Secondary | ICD-10-CM | POA: Diagnosis not present

## 2015-04-09 DIAGNOSIS — R0609 Other forms of dyspnea: Principal | ICD-10-CM | POA: Diagnosis present

## 2015-04-09 DIAGNOSIS — R0602 Shortness of breath: Secondary | ICD-10-CM | POA: Diagnosis present

## 2015-04-09 DIAGNOSIS — R109 Unspecified abdominal pain: Secondary | ICD-10-CM

## 2015-04-09 DIAGNOSIS — M4680 Other specified inflammatory spondylopathies, site unspecified: Secondary | ICD-10-CM | POA: Insufficient documentation

## 2015-04-09 DIAGNOSIS — D72829 Elevated white blood cell count, unspecified: Secondary | ICD-10-CM | POA: Insufficient documentation

## 2015-04-09 DIAGNOSIS — I10 Essential (primary) hypertension: Secondary | ICD-10-CM | POA: Diagnosis not present

## 2015-04-09 DIAGNOSIS — N182 Chronic kidney disease, stage 2 (mild): Secondary | ICD-10-CM | POA: Diagnosis not present

## 2015-04-09 DIAGNOSIS — I7789 Other specified disorders of arteries and arterioles: Secondary | ICD-10-CM

## 2015-04-09 DIAGNOSIS — E785 Hyperlipidemia, unspecified: Secondary | ICD-10-CM | POA: Diagnosis present

## 2015-04-09 DIAGNOSIS — J984 Other disorders of lung: Secondary | ICD-10-CM | POA: Diagnosis not present

## 2015-04-09 DIAGNOSIS — R0600 Dyspnea, unspecified: Secondary | ICD-10-CM

## 2015-04-09 DIAGNOSIS — I129 Hypertensive chronic kidney disease with stage 1 through stage 4 chronic kidney disease, or unspecified chronic kidney disease: Secondary | ICD-10-CM | POA: Diagnosis not present

## 2015-04-09 HISTORY — DX: Reserved for inherently not codable concepts without codable children: IMO0001

## 2015-04-09 LAB — BASIC METABOLIC PANEL
ANION GAP: 9 (ref 5–15)
BUN: 15 mg/dL (ref 6–20)
CHLORIDE: 105 mmol/L (ref 101–111)
CO2: 24 mmol/L (ref 22–32)
Calcium: 9.5 mg/dL (ref 8.9–10.3)
Creatinine, Ser: 1.28 mg/dL — ABNORMAL HIGH (ref 0.61–1.24)
GFR calc Af Amer: >60 mL/min (ref >60)
GFR calc non Af Amer: 55 mL/min — ABNORMAL LOW (ref >60)
GLUCOSE: 105 mg/dL — AB (ref 65–99)
POTASSIUM: 3.8 mmol/L (ref 3.5–5.1)
Sodium: 138 mmol/L (ref 135–145)

## 2015-04-09 LAB — I-STAT TROPONIN, ED: Troponin i, poc: 0 ng/mL (ref 0.00–0.08)

## 2015-04-09 LAB — CBC WITH DIFFERENTIAL/PLATELET
BASOS ABS: 0 10*3/uL (ref 0.0–0.1)
Basophils Relative: 0 %
EOS PCT: 0 %
Eosinophils Absolute: 0 10*3/uL (ref 0.0–0.7)
HEMATOCRIT: 38.8 % — AB (ref 39.0–52.0)
Hemoglobin: 13.2 g/dL (ref 13.0–17.0)
LYMPHS ABS: 2.2 10*3/uL (ref 0.7–4.0)
LYMPHS PCT: 17 %
MCH: 32 pg (ref 26.0–34.0)
MCHC: 34 g/dL (ref 30.0–36.0)
MCV: 93.9 fL (ref 78.0–100.0)
MONO ABS: 1.1 10*3/uL — AB (ref 0.1–1.0)
MONOS PCT: 9 %
NEUTROS ABS: 9.4 10*3/uL — AB (ref 1.7–7.7)
Neutrophils Relative %: 74 %
PLATELETS: 233 10*3/uL (ref 150–400)
RBC: 4.13 MIL/uL — ABNORMAL LOW (ref 4.22–5.81)
RDW: 12.7 % (ref 11.5–15.5)
WBC: 12.7 10*3/uL — ABNORMAL HIGH (ref 4.0–10.5)

## 2015-04-09 LAB — BRAIN NATRIURETIC PEPTIDE: B NATRIURETIC PEPTIDE 5: 29.2 pg/mL (ref 0.0–100.0)

## 2015-04-09 MED ORDER — IPRATROPIUM-ALBUTEROL 0.5-2.5 (3) MG/3ML IN SOLN
3.0000 mL | Freq: Once | RESPIRATORY_TRACT | Status: AC
  Administered 2015-04-09: 3 mL via RESPIRATORY_TRACT
  Filled 2015-04-09 (×2): qty 3

## 2015-04-09 MED ORDER — PREDNISONE 20 MG PO TABS
40.0000 mg | ORAL_TABLET | Freq: Once | ORAL | Status: AC
  Administered 2015-04-09: 40 mg via ORAL
  Filled 2015-04-09: qty 2

## 2015-04-09 MED ORDER — FLUTICASONE PROPIONATE 50 MCG/ACT NA SUSP: 2.0000 | Freq: Every day | NASAL | Status: DC

## 2015-04-09 NOTE — Patient Instructions (Signed)
Medication Instructions:   Your physician recommends that you continue on your current medications as directed. Please refer to the Current Medication list given to you today.     Testing/Procedures:  Your physician has requested that you have an echocardiogram. Echocardiography is a painless test that uses sound waves to create images of your heart. It provides your doctor with information about the size and shape of your heart and how well your heart's chambers and valves are working. This procedure takes approximately one hour. There are no restrictions for this procedure.   Your physician has requested that you have a lexiscan myoview. For further information please visit [email or web address]. Please follow instruction sheet, as given.    Follow-Up:  SCHEDULE A FOLLOW-UP APPOINTMENT AT DR NELSON'S NEXT AVAILABLE     If you need a refill on your cardiac medications before your next appointment, please call your pharmacy.

## 2015-04-09 NOTE — ED Notes (Signed)
Pt resting quietly.  Cardiac Monitor remains NSR.  Family remains at bedside

## 2015-04-09 NOTE — Progress Notes (Signed)
Patient ID: Jack [name], male   DOB: 03-Dec-1945, 69 y.o.   MRN: [id number]      Cardiology Office Note  Date:  04/09/2015   ID:  Jack [name], Jack [name] May 30, 1945, MRN [id number]  PCP:  Cathlean Cower, MD  Cardiologist:  Dorothy Spark, MD   Chief complain: SOB, DOE   History of Present Illness: Jack [name] is a 69 y.o. male who presents for evaluation of DOE. He has h/o hyperlipidemia, HTN and a distant h/o smoking that developed SOB years ago and is getting progressively worse. He used to be very active but now is limiting activities sec to [place]. He has also developed PND in the last couple of months and has to sit up in order to relieve SOB. No LE edema, no claudications, no chest pain.  He has several brothers who were diagnosed with CAD/MIs in their 50'/60'.    Past Medical History  Diagnosis Date  . ALLERGIC RHINITIS 02/16/2007  . BACK PAIN 02/16/2007  . FOOT PAIN, CHRONIC 02/27/2008  . 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 HYPERLIPIDEMIA 02/16/2007  . HYPERTENSION 02/16/2007  . PROSTATE SPECIFIC ANTIGEN, ELEVATED 02/16/2007  . 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 Diverticulosis 09/27/2013    Past Surgical History  Procedure Laterality Date  . Foot surgery      reconstruction x's 4 after work accident     Current Outpatient Prescriptions  Medication Sig Dispense Refill  . albuterol (PROVENTIL HFA;VENTOLIN HFA) 108 (90 BASE) MCG/ACT inhaler Inhale 2 puffs into the lungs every 6 (six) hours as needed for wheezing or shortness of breath. 1 Inhaler 11  . amoxicillin (AMOXIL) 500 MG capsule Take 1 capsule (500 mg total) by mouth 3 (three) times daily. 21 capsule 0  . atenolol (TENORMIN) 25 MG tablet Take 1 tablet (25 mg total) by mouth daily. 90 tablet 3  . cetirizine (ZYRTEC) 10 MG tablet Take 1 tablet (10 mg total) by mouth daily. 30 tablet 11  . ciprofloxacin (CIPRO) 500 MG tablet Take 1 tablet (500 mg total) by mouth 2 (two) times daily. 14 tablet 0  . clidinium-chlordiazePOXIDE  (LIBRAX) 2.5-5 MG per capsule TAKE AS DIRECTED 30 capsule 1  . finasteride (PROSCAR) 5 MG tablet Take 1 tablet (5 mg total) by mouth daily. 30 tablet 11  . hydrOXYzine (ATARAX/VISTARIL) 25 MG tablet Take 1 tablet (25 mg total) by mouth 3 (three) times daily as needed. 30 tablet 1  . lisinopril-hydrochlorothiazide (PRINZIDE,ZESTORETIC) 20-12.5 MG per tablet Take 1 tablet by mouth daily. 30 tablet 11  . methylPREDNISolone (MEDROL DOSEPAK) 4 MG TBPK tablet Tapering dose over 6 days (Patient taking differently: 4-24 mg. Tapering dose over 6 days. Take 6 tablets on Day 1, Take 5 tablets on Day 2, Take 4 tablets on Day 3, Take 3 tablets on Day 4, Take 2 tablets on Day 5 and Take 1 tablet on Day 6.) 21 tablet 0  . simvastatin (ZOCOR) 40 MG tablet Take 1 tablet (40 mg total) by mouth daily at 6 PM. 90 tablet 3  . triamcinolone (NASACORT AQ) 55 MCG/ACT AERO nasal inhaler Place 2 sprays into the nose daily. 1 Inhaler 12   No current facility-administered medications for this visit.    Allergies:   Review of patient's allergies indicates no known allergies.    Social History:  The patient  reports that he has never smoked. He has never used smokeless tobacco. He reports that he does not drink alcohol or use  illicit drugs.   Family History:  The patient's family history includes Alcohol abuse in his brother; Cancer in his mother and sister; Coronary artery disease in his father; Hypertension in his father.    ROS:  Please see the history of present illness.   Otherwise, review of systems are positive for none.   All other systems are reviewed and negative.    PHYSICAL EXAM: VS:  There were no vitals taken for this visit. , BMI There is no weight on file to calculate BMI. GEN: Well nourished, well developed, in no acute distress HEENT: normal Neck: no JVD, carotid bruits, or masses Cardiac: RRR; 2/6 systolic murmur, rubs, or gallops,no edema  Respiratory:  clear to auscultation bilaterally, normal  work of breathing GI: soft, nontender, nondistended, + BS MS: no deformity or atrophy Skin: warm and dry, no rash Neuro:  Strength and sensation are intact Psych: euthymic mood, full affect   EKG:  EKG is ordered today. The ekg ordered today demonstrates SR, normal ECG   Recent Labs: 10/04/2014: TSH 0.25* 03/30/2015: ALT 28 04/02/2015: BUN 25*; Creatinine, Ser 1.32*; Hemoglobin 14.4; Platelets 229; Potassium 3.7; Sodium 133*    Lipid Panel    Component Value Date/Time   CHOL 177 10/04/2014 1548   TRIG 202.0* 10/04/2014 1548   HDL 46.40 10/04/2014 1548   CHOLHDL 4 10/04/2014 1548   VLDL 40.4* 10/04/2014 1548   LDLCALC 73 09/27/2013 1405   LDLDIRECT 86.0 10/04/2014 1548   Wt Readings from Last 3 Encounters:  04/02/15 168 lb 4 oz (76.318 kg)  04/02/15 160 lb (72.576 kg)  03/28/15 198 lb (89.812 kg)     ASSESSMENT AND PLAN:   1. DOE - risk factors include HTN, HLP, FH of premature CAD, we will schedule a Lexiscan nuclear stress test as he has a metal plate in his right ankles and wouldn't be able to walk on treadmill.  2. SOB, PND - check echocardiogram as symptoms suspicious of CHF, unknown systolic and diastolic LV function.   3. Hypertension - controlled   4. Hyperlipidemia - high TG, we will adjust meds if abnormal stress test.  Follow up in 6 weeks.   Signed, Dorothy Spark, MD  04/09/2015 12:52 AM    [place], [place], [place]  [address] Phone: [phone]; Fax: [phone]

## 2015-04-09 NOTE — ED Provider Notes (Signed)
CSN: [id number]     Arrival date & time 04/09/15  1855 History   First MD Initiated Contact with Patient 04/09/15 1908     Chief Complaint  Patient presents with  . Shortness of Breath     HPI   Jack [name] is an 69 y.o. male with history of HTN, HLD who presents to the ED for evaluation of SOB and DOE. Pt has been seen in the ED multiple times in the past for similar complaint. Pt reports he feels his symptoms are progressively getting worse. He states he gets very short of breath just walking around his house. He does endorse orthopnea and states he usually sleeps on his side or sitting up. He states that he feels very anxious because he feels like he cannot breathe well. He states he is afraid to sleep because he thinks he will not wake up. Most recently he was seen in the ED less than two weeks ago with CTA negative for PE, though they did find aortic root enlargement, and otherwise unremarkable workup. Pt has tried PO steroids and nasacort in the past which has reportedly helped his symptoms. He has declined pulmonology evaluation until now. Pt saw cardiology today for evaluation of DOE and there was concern for possible CHF, with nuclear stress and echo scheduled for later this month. Pt came to the ED after his appointment because he is afraid to sleep. His wife states she has never witnessed apneic episodes but states that pt does frequently seem short of breath at home. They are unsure if he has had wheezing. They do not feel comfortable continuing care as an outpatient.   Past Medical History  Diagnosis Date  . ALLERGIC RHINITIS 02/16/2007  . BACK PAIN 02/16/2007  . FOOT PAIN, CHRONIC 02/27/2008  . 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 HYPERLIPIDEMIA 02/16/2007  . HYPERTENSION 02/16/2007  . PROSTATE SPECIFIC ANTIGEN, ELEVATED 02/16/2007  . 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 Diverticulosis 09/27/2013   Past Surgical History  Procedure Laterality Date  . Foot surgery      reconstruction x's 4  after work accident   Family History  Problem Relation Age of Onset  . Cancer Mother     throat & lung  . Coronary artery disease Father   . Hypertension Father   . Cancer Sister   . Alcohol abuse Brother    Social History  Substance Use Topics  . Smoking status: Never Smoker   . Smokeless tobacco: Never Used  . Alcohol Use: No     Comment: no    Review of Systems  All other systems reviewed and are negative.     Allergies  Review of patient's allergies indicates no known allergies.  Home Medications   Prior to Admission medications   Medication Sig Start Date End Date Taking? Authorizing Provider  albuterol (PROVENTIL HFA;VENTOLIN HFA) 108 (90 BASE) MCG/ACT inhaler Inhale 2 puffs into the lungs every 6 (six) hours as needed for wheezing or shortness of breath. 04/02/15   Biagio Borg, MD  atenolol (TENORMIN) 25 MG tablet Take 1 tablet (25 mg total) by mouth daily. 06/26/14   Philemon Kingdom, MD  cetirizine (ZYRTEC) 10 MG tablet Take 1 tablet (10 mg total) by mouth daily. 03/26/15   Noemi Chapel, MD  COMBIVENT RESPIMAT 20-100 MCG/ACT AERS respimat Inhale 1 puff into the lungs every 6 (six) hours as needed.  04/02/15   Historical Provider, MD  finasteride (PROSCAR) 5 MG tablet Take  1 tablet (5 mg total) by mouth daily. 09/27/13   Biagio Borg, MD  hydrOXYzine (ATARAX/VISTARIL) 25 MG tablet Take 1 tablet (25 mg total) by mouth 3 (three) times daily as needed. 04/03/15   Biagio Borg, MD  lisinopril-hydrochlorothiazide (PRINZIDE,ZESTORETIC) 20-12.5 MG per tablet Take 1 tablet by mouth daily. 10/04/14   Biagio Borg, MD  simvastatin (ZOCOR) 40 MG tablet Take 1 tablet (40 mg total) by mouth daily at 6 PM. 10/18/14   Biagio Borg, MD  triamcinolone (NASACORT AQ) 55 MCG/ACT AERO nasal inhaler Place 2 sprays into the nose daily. 03/26/15   Noemi Chapel, MD   BP 122/88 mmHg  Pulse 89  Temp(Src) 97.5 F (36.4 C) (Oral)  Resp 23  Ht 5\' 6"  (1.676 m)  Wt 75.297 kg  BMI 26.81 kg/m2  SpO2  90% Physical Exam  Constitutional: He is oriented to person, place, and time.  HENT:  Right Ear: External ear normal.  Left Ear: External ear normal.  Nose: Nose normal.  Mouth/Throat: Oropharynx is clear and moist. No oropharyngeal exudate.  Eyes: Conjunctivae and EOM are normal. Pupils are equal, round, and reactive to light.  Neck: Normal range of motion. Neck supple.  Cardiovascular: Normal rate, regular rhythm, normal heart sounds and intact distal pulses.   No murmur heard. Pulmonary/Chest: Effort normal. No stridor. No respiratory distress. He has decreased breath sounds. He has no wheezes. He exhibits no tenderness.  Abdominal: Soft. Bowel sounds are normal. He exhibits no distension. There is no tenderness.  Musculoskeletal: He exhibits no edema.  Neurological: He is alert and oriented to person, place, and time. No cranial nerve deficit.  Skin: Skin is warm and dry.  Psychiatric: He has a normal mood and affect.  Nursing note and vitals reviewed.   ED Course  Procedures (including critical care time) Labs Review Labs Reviewed  BASIC METABOLIC PANEL - Abnormal; Notable for the following:    Glucose, Bld 105 (*)    Creatinine, Ser 1.28 (*)    GFR calc non Af Amer 55 (*)    All other components within normal limits  CBC WITH DIFFERENTIAL/PLATELET - Abnormal; Notable for the following:    WBC 12.7 (*)    RBC 4.13 (*)    HCT 38.8 (*)    Neutro Abs 9.4 (*)    Monocytes Absolute 1.1 (*)    All other components within normal limits  BRAIN NATRIURETIC PEPTIDE  I-STAT TROPOININ, ED    Imaging Review Dg Chest 2 View  04/09/2015  CLINICAL DATA:  Shortness of breath for 2 weeks.  Initial encounter. EXAM: CHEST  2 VIEW COMPARISON:  CT chest and PA and lateral chest 04/04/2015. FINDINGS: The lungs are clear. Heart size is normal. There is no pneumothorax or pleural effusion. IMPRESSION: No acute abnormality. Electronically Signed   By: Inge Rise M.D.   On: 04/09/2015  20:02   I have personally reviewed and evaluated these images and lab results as part of my medical decision-making.   EKG Interpretation   Date/Time:  Tuesday April 09 2015 19:03:23 EST Ventricular Rate:  88 PR Interval:  144 QRS Duration: 89 QT Interval:  344 QTC Calculation: 416 R Axis:   85 Text Interpretation:  Sinus rhythm Biatrial enlargement Borderline right  axis deviation no sig change c/w old Confirmed by Johnney Killian, MD, Jeannie Done  [phone]) on 04/09/2015 9:42:56 PM      MDM   Final diagnoses:  Shortness of breath    Lungs sounded tight  on my initial eval. No wheezing or crackles. However, given how tight pt's lungs sound duoneb and prednisone given as I suspected possible inflammatory/RAD/COPD picture. Pt states he feels the unchanged after treatement, although he sounds like he is moving air better to me. Labs otherwise unrevealing. Pt maintains good SpO2 on room air. He is breathing comfortably. CXR unremarkable. White count slightly elevated which appears consistent with pt's baseline. BNP negative. I spoke to on-call cardiologist as review of EMR reveals that cards had been planning for echo and stress test in the near future. Per cards, no emergent indication for cardiology admission and workup at this time. However if pt and his family feel strongly about not feeling comfortable at home we can admit to medicine. Spoke extensively to pt and his family. I suspect there is a large anxiety component at play here as well. Pt and his family do not want to go home and would like to be admitted. Spoke to hospitalist and will admit to tele.     Anne Ng, PA-C 04/10/15 0002  Charlesetta Shanks, MD 04/15/15 [phone]

## 2015-04-09 NOTE — ED Notes (Addendum)
Pt to ED via [facility] with c/o shortness of breath.  Pt has been seen here several times for same and was seen today by cardiologist.  Pt st's cardiologist told him that everything was normal. Pt st's when he can't sleep; at night because he is afraid he will stop breathing.  Pt denies any chest pain or cough.  Pt taking short shallow breaths at this time.

## 2015-04-10 ENCOUNTER — Observation Stay (HOSPITAL_COMMUNITY): Payer: Commercial Managed Care - HMO

## 2015-04-10 ENCOUNTER — Encounter (HOSPITAL_COMMUNITY): Payer: Self-pay | Admitting: General Practice

## 2015-04-10 ENCOUNTER — Ambulatory Visit (HOSPITAL_COMMUNITY): Payer: Commercial Managed Care - HMO | Attending: Internal Medicine

## 2015-04-10 DIAGNOSIS — I712 Thoracic aortic aneurysm, without rupture: Secondary | ICD-10-CM

## 2015-04-10 DIAGNOSIS — I10 Essential (primary) hypertension: Secondary | ICD-10-CM | POA: Diagnosis not present

## 2015-04-10 DIAGNOSIS — R0602 Shortness of breath: Secondary | ICD-10-CM

## 2015-04-10 DIAGNOSIS — I7789 Other specified disorders of arteries and arterioles: Secondary | ICD-10-CM | POA: Diagnosis not present

## 2015-04-10 DIAGNOSIS — R0609 Other forms of dyspnea: Secondary | ICD-10-CM

## 2015-04-10 DIAGNOSIS — I517 Cardiomegaly: Secondary | ICD-10-CM | POA: Insufficient documentation

## 2015-04-10 DIAGNOSIS — R0600 Dyspnea, unspecified: Secondary | ICD-10-CM | POA: Insufficient documentation

## 2015-04-10 DIAGNOSIS — E785 Hyperlipidemia, unspecified: Secondary | ICD-10-CM | POA: Diagnosis not present

## 2015-04-10 LAB — CBC WITH DIFFERENTIAL/PLATELET
Basophils Absolute: 0 10*3/uL (ref 0.0–0.1)
Basophils Relative: 0 %
Eosinophils Absolute: 0 10*3/uL (ref 0.0–0.7)
Eosinophils Relative: 0 %
HEMATOCRIT: 40.1 % (ref 39.0–52.0)
HEMOGLOBIN: 13.4 g/dL (ref 13.0–17.0)
LYMPHS ABS: 0.9 10*3/uL (ref 0.7–4.0)
LYMPHS PCT: 8 %
MCH: 31.6 pg (ref 26.0–34.0)
MCHC: 33.4 g/dL (ref 30.0–36.0)
MCV: 94.6 fL (ref 78.0–100.0)
MONO ABS: 0.4 10*3/uL (ref 0.1–1.0)
MONOS PCT: 4 %
NEUTROS ABS: 9.4 10*3/uL — AB (ref 1.7–7.7)
NEUTROS PCT: 88 %
Platelets: 243 10*3/uL (ref 150–400)
RBC: 4.24 MIL/uL (ref 4.22–5.81)
RDW: 12.7 % (ref 11.5–15.5)
WBC: 10.7 10*3/uL — ABNORMAL HIGH (ref 4.0–10.5)

## 2015-04-10 LAB — COMPREHENSIVE METABOLIC PANEL
ALBUMIN: 3.4 g/dL — AB (ref 3.5–5.0)
ALK PHOS: 62 U/L (ref 38–126)
ALT: 22 U/L (ref 17–63)
AST: 24 U/L (ref 15–41)
Anion gap: 11 (ref 5–15)
BILIRUBIN TOTAL: 0.8 mg/dL (ref 0.3–1.2)
BUN: 18 mg/dL (ref 6–20)
CALCIUM: 9.6 mg/dL (ref 8.9–10.3)
CO2: 23 mmol/L (ref 22–32)
CREATININE: 1.31 mg/dL — AB (ref 0.61–1.24)
Chloride: 103 mmol/L (ref 101–111)
GFR calc Af Amer: >60 mL/min (ref >60)
GFR, EST NON AFRICAN AMERICAN: 54 mL/min — AB (ref >60)
GLUCOSE: 126 mg/dL — AB (ref 65–99)
POTASSIUM: 4 mmol/L (ref 3.5–5.1)
Sodium: 137 mmol/L (ref 135–145)
Total Protein: 7 g/dL (ref 6.5–8.1)

## 2015-04-10 LAB — TROPONIN I: Troponin I: 0.03 ng/mL (ref <0.031)

## 2015-04-10 LAB — LIPASE, BLOOD: Lipase: 34 U/L (ref 11–51)

## 2015-04-10 MED ORDER — ONDANSETRON HCL 4 MG/2ML IJ SOLN: 4.0000 mg | Freq: Four times a day (QID) | INTRAMUSCULAR | Status: DC | PRN

## 2015-04-10 MED ORDER — ALPRAZOLAM 0.25 MG PO TABS
0.2500 mg | ORAL_TABLET | Freq: Once | ORAL | Status: AC
  Administered 2015-04-10: 0.25 mg via ORAL
  Filled 2015-04-10: qty 1

## 2015-04-10 MED ORDER — LISINOPRIL 10 MG PO TABS
20.0000 mg | ORAL_TABLET | Freq: Every day | ORAL | Status: DC
  Administered 2015-04-10 – 2015-04-11 (×2): 20 mg via ORAL
  Filled 2015-04-10 (×2): qty 2

## 2015-04-10 MED ORDER — ENSURE ENLIVE PO LIQD
237.0000 mL | Freq: Two times a day (BID) | ORAL | Status: DC
  Administered 2015-04-11: 237 mL via ORAL

## 2015-04-10 MED ORDER — SODIUM CHLORIDE 0.9 % IJ SOLN
3.0000 mL | Freq: Two times a day (BID) | INTRAMUSCULAR | Status: DC
  Administered 2015-04-10 – 2015-04-11 (×2): 3 mL via INTRAVENOUS

## 2015-04-10 MED ORDER — HYDROXYZINE HCL 25 MG PO TABS
25.0000 mg | ORAL_TABLET | Freq: Three times a day (TID) | ORAL | Status: DC | PRN
  Administered 2015-04-11: 25 mg via ORAL
  Filled 2015-04-10: qty 1

## 2015-04-10 MED ORDER — ACETAMINOPHEN 325 MG PO TABS: 650.0000 mg | ORAL_TABLET | Freq: Four times a day (QID) | ORAL | Status: DC | PRN

## 2015-04-10 MED ORDER — SIMVASTATIN 40 MG PO TABS
40.0000 mg | ORAL_TABLET | Freq: Every day | ORAL | Status: DC
  Administered 2015-04-10: 40 mg via ORAL
  Filled 2015-04-10 (×2): qty 1

## 2015-04-10 MED ORDER — HYDROCHLOROTHIAZIDE 12.5 MG PO CAPS
12.5000 mg | ORAL_CAPSULE | Freq: Every day | ORAL | Status: DC
  Administered 2015-04-10 – 2015-04-11 (×2): 12.5 mg via ORAL
  Filled 2015-04-10 (×2): qty 1

## 2015-04-10 MED ORDER — LORATADINE 10 MG PO TABS
10.0000 mg | ORAL_TABLET | Freq: Every day | ORAL | Status: DC
  Administered 2015-04-10 – 2015-04-11 (×2): 10 mg via ORAL
  Filled 2015-04-10 (×2): qty 1

## 2015-04-10 MED ORDER — TECHNETIUM TC 99M SESTAMIBI GENERIC - CARDIOLITE
30.0000 | Freq: Once | INTRAVENOUS | Status: AC | PRN
  Administered 2015-04-10: 30 via INTRAVENOUS

## 2015-04-10 MED ORDER — ACETAMINOPHEN 650 MG RE SUPP: 650.0000 mg | Freq: Four times a day (QID) | RECTAL | Status: DC | PRN

## 2015-04-10 MED ORDER — ENOXAPARIN SODIUM 40 MG/0.4ML ~~LOC~~ SOLN
40.0000 mg | Freq: Every day | SUBCUTANEOUS | Status: DC
  Administered 2015-04-10 – 2015-04-11 (×2): 40 mg via SUBCUTANEOUS
  Filled 2015-04-10 (×3): qty 0.4

## 2015-04-10 MED ORDER — REGADENOSON 0.4 MG/5ML IV SOLN
INTRAVENOUS | Status: AC
  Filled 2015-04-10: qty 5

## 2015-04-10 MED ORDER — ONDANSETRON HCL 4 MG PO TABS: 4.0000 mg | ORAL_TABLET | Freq: Four times a day (QID) | ORAL | Status: DC | PRN

## 2015-04-10 MED ORDER — FINASTERIDE 5 MG PO TABS
5.0000 mg | ORAL_TABLET | Freq: Every day | ORAL | Status: DC
  Administered 2015-04-10 – 2015-04-11 (×2): 5 mg via ORAL
  Filled 2015-04-10 (×3): qty 1

## 2015-04-10 MED ORDER — ATENOLOL 25 MG PO TABS
25.0000 mg | ORAL_TABLET | Freq: Every day | ORAL | Status: DC
  Administered 2015-04-10 – 2015-04-11 (×2): 25 mg via ORAL
  Filled 2015-04-10 (×2): qty 1

## 2015-04-10 MED ORDER — REGADENOSON 0.4 MG/5ML IV SOLN
0.4000 mg | Freq: Once | INTRAVENOUS | Status: AC
Start: 2015-04-10 — End: 2015-04-10
  Administered 2015-04-10: 0.4 mg via INTRAVENOUS

## 2015-04-10 MED ORDER — TECHNETIUM TC 99M SESTAMIBI GENERIC - CARDIOLITE
10.0000 | Freq: Once | INTRAVENOUS | Status: AC | PRN
  Administered 2015-04-10: 10 via INTRAVENOUS

## 2015-04-10 MED ORDER — ALBUTEROL SULFATE (2.5 MG/3ML) 0.083% IN NEBU: 2.5000 mg | INHALATION_SOLUTION | RESPIRATORY_TRACT | Status: DC | PRN

## 2015-04-10 MED ORDER — IPRATROPIUM-ALBUTEROL 0.5-2.5 (3) MG/3ML IN SOLN: 3.0000 mL | Freq: Four times a day (QID) | RESPIRATORY_TRACT | Status: DC | PRN

## 2015-04-10 MED ORDER — ASPIRIN EC 325 MG PO TBEC
325.0000 mg | DELAYED_RELEASE_TABLET | Freq: Every day | ORAL | Status: DC
  Administered 2015-04-10 – 2015-04-11 (×2): 325 mg via ORAL
  Filled 2015-04-10 (×2): qty 1

## 2015-04-10 NOTE — Progress Notes (Addendum)
  1-day NST completed. Official read pending by [facility].  Signed, Erma Heritage, PA-C 04/10/2015, 2:49 PM Pager: [phone]   Official read is still pending by [facility]. Of note, his stress test was not completed until around 1530.  Signed, Erma Heritage, PA-C 04/10/2015, 8:04 PM Pager: [phone]

## 2015-04-10 NOTE — Care Management Note (Signed)
Case Management Note  Patient Details  Name: Jack [name] MRN: [id number] Date of Birth: 1945-07-28  Subjective/Objective:        Pt admitted with dyspnea on exertion            Action/Plan:  Pt is independent from home with wife.  Pt stated he has hardship paying for Combivent as its not covered under his insurance plan, CM made request of MD for a cheaper option for pt.  CM will continue to monitor for disposition needs.   Expected Discharge Date:                  Expected Discharge Plan:  Home/Self Care (independent from home with wife)  In-House Referral:     Discharge planning Services     Post Acute Care Choice:    Choice offered to:     DME Arranged:    DME Agency:     HH Arranged:    [place] Agency:  [place]  Status of Service:     Medicare Important Message Given:    Date Medicare IM Given:    Medicare IM give by:    Date Additional Medicare IM Given:    Additional Medicare Important Message give by:     If discussed at [place] of Stay Meetings, dates discussed:    Additional Comments:  Maryclare Labrador, RN 04/10/2015, 3:54 PM

## 2015-04-10 NOTE — H&P (Addendum)
[facility] History and Physical  Jack [name] [id number] DOB: 04-27-1946 DOA: 04/09/2015  Referring physician: Ms.Sam. PCP: Cathlean Cower, MD  Specialists: [facility].  Chief Complaint: Shortness of breath.  HPI: Jack [name] is a 69 y.o. male with history of hypertension hyperlipidemia has been experiencing shortness of breath over the last few weeks. Patient did come to the ER last week and at that time CT angiogram of the chest showed ascending aortic dilatation. Was negative for PE. Patient did go to the cardiology office yesterday and was originally planned to have outpatient workup including stress test and 2-D echo. But since patient was getting more anxious and easily short of breath patient came back to the ER. On my exam patient is not in distress but very anxious. Patient states he gets at times just squeezing sensation. Patient was given prednisone and albuterol in the ER. Patient has been admitted for dyspnea on exertion. Patient says he gets short of breath on standing and lying down and sometimes on exertion. Denies any productive cough fever chills. Has been complaining of some abdominal discomfort in the epigastric area.   Review of Systems: As presented in the history of presenting illness, rest negative.  Past Medical History  Diagnosis Date  . ALLERGIC RHINITIS 02/16/2007  . BACK PAIN 02/16/2007  . FOOT PAIN, CHRONIC 02/27/2008  . 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 HYPERLIPIDEMIA 02/16/2007  . HYPERTENSION 02/16/2007  . PROSTATE SPECIFIC ANTIGEN, ELEVATED 02/16/2007  . 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 Diverticulosis 09/27/2013   Past Surgical History  Procedure Laterality Date  . Foot surgery      reconstruction x's 4 after work accident   Social History:  reports that he has never smoked. He has never used smokeless tobacco. He reports that he does not drink alcohol or use illicit drugs. Where does patient live home. Can patient participate in ADLs?  Yes.  No Known Allergies  Family History:  Family History  Problem Relation Age of Onset  . Cancer Mother     throat & lung  . Coronary artery disease Father   . Hypertension Father   . Cancer Sister   . Alcohol abuse Brother       Prior to Admission medications   Medication Sig Start Date End Date Taking? Authorizing Provider  albuterol (PROVENTIL HFA;VENTOLIN HFA) 108 (90 BASE) MCG/ACT inhaler Inhale 2 puffs into the lungs every 6 (six) hours as needed for wheezing or shortness of breath. 04/02/15  Yes Biagio Borg, MD  atenolol (TENORMIN) 25 MG tablet Take 1 tablet (25 mg total) by mouth daily. 06/26/14  Yes Philemon Kingdom, MD  cetirizine (ZYRTEC) 10 MG tablet Take 1 tablet (10 mg total) by mouth daily. 03/26/15  Yes Noemi Chapel, MD  COMBIVENT RESPIMAT 20-100 MCG/ACT AERS respimat Inhale 1 puff into the lungs every 6 (six) hours as needed for wheezing or shortness of breath.  04/02/15  Yes Historical Provider, MD  finasteride (PROSCAR) 5 MG tablet Take 1 tablet (5 mg total) by mouth daily. 09/27/13  Yes Biagio Borg, MD  hydrOXYzine (ATARAX/VISTARIL) 25 MG tablet Take 1 tablet (25 mg total) by mouth 3 (three) times daily as needed. Patient taking differently: Take 25 mg by mouth 3 (three) times daily as needed for anxiety.  04/03/15  Yes Biagio Borg, MD  lisinopril-hydrochlorothiazide (PRINZIDE,ZESTORETIC) 20-12.5 MG per tablet Take 1 tablet by mouth daily. 10/04/14  Yes Biagio Borg, MD  simvastatin (ZOCOR) 40  MG tablet Take 1 tablet (40 mg total) by mouth daily at 6 PM. 10/18/14  Yes Biagio Borg, MD    Physical Exam: Filed Vitals:   04/09/15 2300 04/09/15 2315 04/09/15 2330 04/09/15 2345  BP: 132/75 132/72 131/71 124/74  Pulse: 99 95 95 93  Temp:      TempSrc:      Resp: 22 25 26 25   Height:      Weight:      SpO2: 98% 97% 97% 97%     General:  Moderately built and nourished.  Eyes: Anicteric no pallor.  ENT: No discharge from the ears eyes nose and mouth.  Neck: No  mass felt.  Cardiovascular: S1-S2 heard.  Respiratory: No rhonchi or crepitations.  Abdomen: Soft nontender bowel sounds present. No guarding or rigidity.  Skin: No rash.  Musculoskeletal: No edema.  Psychiatric: Appears normal.  Neurologic: Alert awake oriented to time place and person. Moves all extremities.  Labs on Admission:  Basic Metabolic Panel:  Recent Labs Lab 04/09/15 2019  NA 138  K 3.8  CL 105  CO2 24  GLUCOSE 105*  BUN 15  CREATININE 1.28*  CALCIUM 9.5   Liver Function Tests: No results for input(s): AST, ALT, ALKPHOS, BILITOT, PROT, ALBUMIN in the last 168 hours. No results for input(s): LIPASE, AMYLASE in the last 168 hours. No results for input(s): AMMONIA in the last 168 hours. CBC:  Recent Labs Lab 04/09/15 2019  WBC 12.7*  NEUTROABS 9.4*  HGB 13.2  HCT 38.8*  MCV 93.9  PLT 233   Cardiac Enzymes: No results for input(s): CKTOTAL, CKMB, CKMBINDEX, TROPONINI in the last 168 hours.  BNP (last 3 results)  Recent Labs  04/09/15 2016  BNP 29.2    ProBNP (last 3 results) No results for input(s): PROBNP in the last 8760 hours.  CBG: No results for input(s): GLUCAP in the last 168 hours.  Radiological Exams on Admission: Dg Chest 2 View  04/09/2015  CLINICAL DATA:  Shortness of breath for 2 weeks.  Initial encounter. EXAM: CHEST  2 VIEW COMPARISON:  CT chest and PA and lateral chest 04/04/2015. FINDINGS: The lungs are clear. Heart size is normal. There is no pneumothorax or pleural effusion. IMPRESSION: No acute abnormality. Electronically Signed   By: Inge Rise M.D.   On: 04/09/2015 20:02    EKG: Independently reviewed. Normal sinus rhythm biatrial enlargement.  Assessment/Plan Principal Problem:   Dyspnea on exertion Active Problems:   Hyperlipidemia   SOB (shortness of breath)   Hypertension   1. Dyspnea on exertion - cardiology was originally planning to have stress test and 2-D echo as outpatient. At this time we  will cycle cardiac markers and keep patient nothing by mouth in a.m. in anticipation of possible cardiac procedure. I have requested cardiology consult. Check 2-D echo. Patient has had a recent CT angiogram of the chest which was negative for PE but did show ascending aortic dilatation. 2. Abdominal discomfort - check sonogram of the abdomen and lipase. 3. Hypertension - will continue present medications. If creatinine worsens may have to hold ace inhibitors and diuretics. 4. Chronic kidney disease stage II - follow metabolic panel. 5. Leukocytosis probably reactionary - closely follow CBC. 6. Hyperlipidemia on statins.  I have reviewed patient's chest x-ray EKG and old charts and labs.   DVT Prophylaxis Lovenox.  Code Status: Full code.  Family Communication: Patient's wife.  Disposition Plan: Admit for observation.    Jack [name] N. [facility] Pager  [phone].  If 7PM-7AM, please contact night-coverage [email or web address] Password [email or web address] 04/10/2015, 12:04 AM

## 2015-04-10 NOTE — Progress Notes (Signed)
  Echocardiogram 2D Echocardiogram has been performed.  Diamond Nickel 04/10/2015, 10:38 AM

## 2015-04-10 NOTE — Consult Note (Signed)
CARDIOLOGY CONSULT NOTE   Patient ID: Jack [name] MRN: [id number], DOB/AGE: [id number]   Admit date: 04/09/2015 Date of Consult: 04/10/2015  Primary Physician: Cathlean Cower, MD Primary Cardiologist: Dr. Meda Coffee  Reason for consult:  SOB  Problem List  Past Medical History  Diagnosis Date  . ALLERGIC RHINITIS 02/16/2007  . BACK PAIN 02/16/2007  . FOOT PAIN, CHRONIC 02/27/2008  . 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 HYPERLIPIDEMIA 02/16/2007  . HYPERTENSION 02/16/2007  . PROSTATE SPECIFIC ANTIGEN, ELEVATED 02/16/2007  . 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 Diverticulosis 09/27/2013    Past Surgical History  Procedure Laterality Date  . Foot surgery      reconstruction x's 4 after work accident    Allergies  No Known Allergies  HPI   Jack [name] is a 69 y.o. male who presents for evaluation of DOE. He has h/o hyperlipidemia, HTN and a distant h/o smoking that developed SOB years ago and is getting progressively worse. He used to be very active but now is limiting activities sec to [place]. He has also developed PND in the last couple of months and has to sit up in order to relieve SOB. No LE edema, no claudications, no chest pain. He has several brothers who were diagnosed with CAD/MIs in their 50'/60'.  Patient did come to the ER last week and at that time CT angiogram of the chest showed ascending aortic dilatation measuring 43 mm and suggesting repeat MRA/CTA annually. Was negative for PE. Patient did go to the cardiology office yesterday and was originally planned to have outpatient workup including stress test and 2-D echo. But since patient was getting more anxious and easily short of breath patient came back to the ER. On my exam patient is not in distress but very anxious. Patient states he gets at times just squeezing sensation. Patient was given prednisone and albuterol in the ER. Patient has been admitted for dyspnea on exertion. Patient says he gets short of breath on standing and  lying down and sometimes on exertion. Denies any productive cough fever chills. Has been complaining of some abdominal discomfort in the epigastric area.   Inpatient Medications  . aspirin EC  325 mg Oral Daily  . atenolol  25 mg Oral Daily  . enoxaparin (LOVENOX) injection  40 mg Subcutaneous Daily  . finasteride  5 mg Oral Daily  . lisinopril  20 mg Oral Daily   And  . hydrochlorothiazide  12.5 mg Oral Daily  . loratadine  10 mg Oral Daily  . simvastatin  40 mg Oral q1800  . sodium chloride  3 mL Intravenous Q12H   Family History Family History  Problem Relation Age of Onset  . Cancer Mother     throat & lung  . Coronary artery disease Father   . Hypertension Father   . Cancer Sister   . Alcohol abuse Brother     Social History Social History   Social History  . Marital Status: Married    Spouse Name: N/A  . Number of Children: N/A  . Years of Education: N/A   Occupational History  . Not on file.   Social History Main Topics  . Smoking status: Never Smoker   . Smokeless tobacco: Never Used  . Alcohol Use: No     Comment: no  . Drug Use: No  . Sexual Activity:    Partners: Female   Other Topics Concern  . Not on file  Social History Narrative   11 grade education   Married 1968   2 son's='69, '70; 1 dtr '80; 5 grandsons   Retired on disability from foot injury. Had worked as a Ecologist   Marriage in good health-wife smokes 3 pks day    Review of Systems  General:  No chills, fever, night sweats or weight changes.  Cardiovascular:  No chest pain, dyspnea on exertion, edema, orthopnea, palpitations, paroxysmal nocturnal dyspnea. Dermatological: No rash, lesions/masses Respiratory: No cough, dyspnea Urologic: No hematuria, dysuria Abdominal:   No nausea, vomiting, diarrhea, bright red blood per rectum, melena, or hematemesis Neurologic:  No visual changes, wkns, changes in mental status. All other systems reviewed and are otherwise negative  except as noted above.  Physical Exam  Blood pressure 141/97, pulse 101, temperature 98.1 F (36.7 C), temperature source Oral, resp. rate 18, height 5\' 6"  (1.676 m), weight 160 lb 0.9 oz (72.6 kg), SpO2 99 %.  General: Pleasant, NAD Psych: Normal affect. Neuro: Alert and oriented X 3. Moves all extremities spontaneously. HEENT: Normal  Neck: Supple without bruits or JVD. Lungs:  Resp regular and unlabored, CTA. Heart: RRR no s3, s4, or murmurs. Abdomen: Soft, non-tender, non-distended, BS + x 4.  Extremities: No clubbing, cyanosis or edema. DP/PT/Radials 2+ and equal bilaterally.  Labs  Recent Labs  04/10/15 0608  TROPONINI <0.03   Lab Results  Component Value Date   WBC 10.7* 04/10/2015   HGB 13.4 04/10/2015   HCT 40.1 04/10/2015   MCV 94.6 04/10/2015   PLT 243 04/10/2015    Recent Labs Lab 04/10/15 0608  NA 137  K 4.0  CL 103  CO2 23  BUN 18  CREATININE 1.31*  CALCIUM 9.6  PROT 7.0  BILITOT 0.8  ALKPHOS 62  ALT 22  AST 24  GLUCOSE 126*   Lab Results  Component Value Date   CHOL 177 10/04/2014   HDL 46.40 10/04/2014   LDLCALC 73 09/27/2013   TRIG 202.0* 10/04/2014   Lab Results  Component Value Date   DDIMER 0.68* 04/02/2015   Radiology/Studies  Dg Chest 2 View  04/09/2015  CLINICAL DATA:  Shortness of breath for 2 weeks.  Initial encounter. EXAM: CHEST  2 VIEW COMPARISON:  CT chest and PA and lateral chest 04/04/2015. FINDINGS: The lungs are clear. Heart size is normal. There is no pneumothorax or pleural effusion. IMPRESSION: No acute abnormality. Electronically Signed   By: Inge Rise M.D.   On: 04/09/2015 20:02   Ct Angio Chest Pe W/cm &/or Wo Cm  04/02/2015  CLINICAL DATA:  69 year old male with shortness of breath and abnormal D-dimer. Initial encounter. IMPRESSION: 1.  No evidence of acute pulmonary embolus. 2. 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 No significant aortic atherosclerosis. Recommend annual  imaging followup by CTA or MRA. This recommendation follows 2010 ACCF/AHA/AATS/ACR/ASA/SCA/SCAI/SIR/STS/SVM Guidelines for the Diagnosis and Management of Patients with Thoracic Aortic Disease. Circulation. 2010; 121: [id number] 3. Negative lungs aside from stable mild linear scarring or atelectasis in the lower lobes.   US Abdomen Complete  04/10/2015  CLINICAL DATA:  Abdominal pain x2 months, difficulty breathing . IMPRESSION: Negative abdominal ultrasound.   Ct Abdomen Pelvis W Contrast  03/30/2015  CLINICAL DATA:  Periumbilical pain radiating into the right lower quadrant  IMPRESSION: 1. Normal appendix. No acute inflammatory changes in the abdomen or pelvis. 2. Distention of the urinary bladder, nearly to the level of the umbilicus. Marked prostatic enlargement.  Echocardiogram - none  ECG: SR, bi-atrial enlargement    ASSESSMENT AND PLAN  1. DOE - risk factors include HTN, HLP, FH of premature CAD, we will schedule a Lexiscan nuclear stress test as he has a metal plate in his right ankles and wouldn't be able to walk on treadmill. We will order echocardiogram and pulmonary function test.   2. SOB, PND - check echocardiogram as symptoms suspicious of CHF, unknown systolic and diastolic LV function.   3. Ascending aortic dilatation - measuring 43 mm, No significant aortic atherosclerosis. Recommend annual imaging followup by CTA or MRA.   3. Hypertension - controlled   4. Hyperlipidemia - high TG, we will adjust meds if abnormal stress test.    Signed, Dorothy Spark, MD, [facility] 04/10/2015, 11:15 AM

## 2015-04-10 NOTE — Progress Notes (Signed)
Patient seen and examined this morning, admitted overnight with dyspnea on exertion, endorses feeling short of breath even at rest.  DOE - cardiology to evaluate, 2D echo, stress test Abdominal discomfort - US abdomen and lipase negative Hypertension - will continue present medications. If creatinine worsens may have to hold ace inhibitors and diuretics. Chronic kidney disease stage II - follow metabolic panel. Leukocytosis probably reactionary - closely follow CBC. Hyperlipidemia on statins.  Shany Marinez M. Cruzita Lederer, MD [facility] [phone]

## 2015-04-11 ENCOUNTER — Observation Stay (HOSPITAL_COMMUNITY): Payer: Commercial Managed Care - HMO

## 2015-04-11 DIAGNOSIS — R0602 Shortness of breath: Secondary | ICD-10-CM | POA: Diagnosis not present

## 2015-04-11 DIAGNOSIS — E785 Hyperlipidemia, unspecified: Secondary | ICD-10-CM

## 2015-04-11 DIAGNOSIS — I10 Essential (primary) hypertension: Secondary | ICD-10-CM | POA: Diagnosis not present

## 2015-04-11 DIAGNOSIS — R0609 Other forms of dyspnea: Secondary | ICD-10-CM | POA: Diagnosis not present

## 2015-04-11 DIAGNOSIS — J984 Other disorders of lung: Secondary | ICD-10-CM

## 2015-04-11 LAB — CBC
HEMATOCRIT: 37.3 % — AB (ref 39.0–52.0)
HEMOGLOBIN: 12.5 g/dL — AB (ref 13.0–17.0)
MCH: 32 pg (ref 26.0–34.0)
MCHC: 33.5 g/dL (ref 30.0–36.0)
MCV: 95.4 fL (ref 78.0–100.0)
Platelets: 230 10*3/uL (ref 150–400)
RBC: 3.91 MIL/uL — AB (ref 4.22–5.81)
RDW: 13 % (ref 11.5–15.5)
WBC: 12.5 10*3/uL — ABNORMAL HIGH (ref 4.0–10.5)

## 2015-04-11 LAB — PULMONARY FUNCTION TEST
DL/VA % pred: 102 %
DL/VA: 4.36 ml/min/mmHg/L
DLCO cor % pred: 98 %
DLCO cor: 25.09 ml/min/mmHg
DLCO unc % pred: 91 %
DLCO unc: 23.47 ml/min/mmHg
FEF 25-75 Post: 1.14 L/sec
FEF 25-75 Pre: 1.55 L/sec
FEF2575-%Change-Post: -26 %
FEF2575-%Pred-Post: 55 %
FEF2575-%Pred-Pre: 75 %
FEV1-%Change-Post: -15 %
FEV1-%Pred-Post: 55 %
FEV1-%Pred-Pre: 65 %
FEV1-Post: 1.29 L
FEV1-Pre: 1.52 L
FEV1FVC-%Change-Post: -28 %
FEV1FVC-%Pred-Pre: 107 %
FEV6-%Change-Post: 10 %
FEV6-%Pred-Post: 68 %
FEV6-%Pred-Pre: 62 %
FEV6-Post: 2.03 L
FEV6-Pre: 1.84 L
FEV6FVC-%Change-Post: -1 %
FEV6FVC-%Pred-Post: 103 %
FEV6FVC-%Pred-Pre: 105 %
FVC-%Change-Post: 18 %
FVC-%Pred-Post: 70 %
FVC-%Pred-Pre: 59 %
FVC-Post: 2.19 L
FVC-Pre: 1.84 L
Post FEV1/FVC ratio: 59 %
Post FEV6/FVC ratio: 98 %
Pre FEV1/FVC ratio: 83 %
Pre FEV6/FVC Ratio: 100 %
RV % pred: 96 %
RV: 2.09 L
TLC % pred: 94 %
TLC: 5.68 L

## 2015-04-11 LAB — NM MYOCAR MULTI W/SPECT W/WALL MOTION / EF
Estimated workload: 1 METS
Exercise duration (min): 0 min
Exercise duration (sec): 0 s
MPHR: 151 {beats}/min
Peak HR: 108 {beats}/min
Percent HR: 71 %
Rest HR: 81 {beats}/min

## 2015-04-11 LAB — BASIC METABOLIC PANEL
ANION GAP: 8 (ref 5–15)
BUN: 26 mg/dL — ABNORMAL HIGH (ref 6–20)
CO2: 25 mmol/L (ref 22–32)
Calcium: 9.4 mg/dL (ref 8.9–10.3)
Chloride: 103 mmol/L (ref 101–111)
Creatinine, Ser: 1.25 mg/dL — ABNORMAL HIGH (ref 0.61–1.24)
GFR calc Af Amer: >60 mL/min (ref >60)
GFR calc non Af Amer: 57 mL/min — ABNORMAL LOW (ref >60)
GLUCOSE: 85 mg/dL (ref 65–99)
POTASSIUM: 4.1 mmol/L (ref 3.5–5.1)
Sodium: 136 mmol/L (ref 135–145)

## 2015-04-11 MED ORDER — ALBUTEROL SULFATE (2.5 MG/3ML) 0.083% IN NEBU: 2.5000 mg | INHALATION_SOLUTION | Freq: Four times a day (QID) | RESPIRATORY_TRACT | Status: DC | PRN

## 2015-04-11 MED ORDER — POLYETHYLENE GLYCOL 3350 17 G PO PACK
17.0000 g | PACK | Freq: Two times a day (BID) | ORAL | Status: DC
  Administered 2015-04-11: 17 g via ORAL
  Filled 2015-04-11: qty 1

## 2015-04-11 MED ORDER — IPRATROPIUM BROMIDE 0.02 % IN SOLN: 0.5000 mg | Freq: Three times a day (TID) | RESPIRATORY_TRACT | Status: DC

## 2015-04-11 MED ORDER — ALBUTEROL SULFATE (2.5 MG/3ML) 0.083% IN NEBU
2.5000 mg | INHALATION_SOLUTION | Freq: Once | RESPIRATORY_TRACT | Status: AC
  Administered 2015-04-11: 2.5 mg via RESPIRATORY_TRACT

## 2015-04-11 NOTE — Progress Notes (Signed)
SUBJECTIVE:  Feels better.    OBJECTIVE:   Vitals:   Filed Vitals:   04/10/15 1656 04/10/15 2132 04/11/15 0529 04/11/15 1120  BP:  103/64 117/67   Pulse:  67 87   Temp:  98.5 F (36.9 C) 98.1 F (36.7 C)   TempSrc:  Oral Oral   Resp: 18 18    Height:      Weight:      SpO2: 100% 99% 99% 94%   I&O's:   Intake/Output Summary (Last 24 hours) at 04/11/15 1143 Last data filed at 04/11/15 0800  Gross per 24 hour  Intake   1080 ml  Output      0 ml  Net   1080 ml   TELEMETRY: Reviewed telemetry pt in NSR:     PHYSICAL EXAM General: Well developed, well nourished, in no acute distress Head:   Normal cephalic and atramatic  Lungs:   Clear bilaterally to auscultation. Heart:   HRRR S1 S2  No JVD.   Abdomen: abdomen soft and non-tender Msk:  Back normal,  Normal strength and tone for age. Extremities: No edema.   Neuro: Alert and oriented. Psych:  Normal affect, responds appropriately Skin: No rash   LABS: Basic Metabolic Panel:  Recent Labs  04/10/15 0608 04/11/15 0318  NA 137 136  K 4.0 4.1  CL 103 103  CO2 23 25  GLUCOSE 126* 85  BUN 18 26*  CREATININE 1.31* 1.25*  CALCIUM 9.6 9.4   Liver Function Tests:  Recent Labs  04/10/15 0608  AST 24  ALT 22  ALKPHOS 62  BILITOT 0.8  PROT 7.0  ALBUMIN 3.4*    Recent Labs  04/10/15 0608  LIPASE 34   CBC:  Recent Labs  04/09/15 2019 04/10/15 0608 04/11/15 0318  WBC 12.7* 10.7* 12.5*  NEUTROABS 9.4* 9.4*  --   HGB 13.2 13.4 12.5*  HCT 38.8* 40.1 37.3*  MCV 93.9 94.6 95.4  PLT 233 243 230   Cardiac Enzymes:  Recent Labs  04/10/15 0608 04/10/15 1046 04/10/15 1610  TROPONINI <0.03 <0.03 0.03   BNP: Invalid input(s): POCBNP D-Dimer: No results for input(s): DDIMER in the last 72 hours. Hemoglobin A1C: No results for input(s): HGBA1C in the last 72 hours. Fasting Lipid Panel: No results for input(s): CHOL, HDL, LDLCALC, TRIG, CHOLHDL, LDLDIRECT in the last 72 hours. Thyroid Function  Tests: No results for input(s): TSH, T4TOTAL, T3FREE, THYROIDAB in the last 72 hours.  Invalid input(s): FREET3 Anemia Panel: No results for input(s): VITAMINB12, FOLATE, FERRITIN, TIBC, IRON, RETICCTPCT in the last 72 hours. Coag Panel:   No results found for: INR, PROTIME  RADIOLOGY: Dg Chest 2 View  04/09/2015  CLINICAL DATA:  Shortness of breath for 2 weeks.  Initial encounter. EXAM: CHEST  2 VIEW COMPARISON:  CT chest and PA and lateral chest 04/04/2015. FINDINGS: The lungs are clear. Heart size is normal. There is no pneumothorax or pleural effusion. IMPRESSION: No acute abnormality. Electronically Signed   By: Inge Rise M.D.   On: 04/09/2015 20:02   Dg Chest 2 View  04/02/2015  CLINICAL DATA:  Initial evaluation for acute shortness of breath. EXAM: CHEST  2 VIEW COMPARISON:  Prior radiograph from 03/26/2015. FINDINGS: The cardiac and mediastinal silhouettes are stable in size and contour, and remain within normal limits. The lungs are normally inflated. No airspace consolidation, pleural effusion, or pulmonary edema is identified. There is no pneumothorax. No acute osseous abnormality identified. IMPRESSION: No active cardiopulmonary disease. Electronically  Signed   By: Jeannine Boga M.D.   On: 04/02/2015 02:30   Dg Chest 2 View  03/26/2015  CLINICAL DATA:  Two week history of chest tightness. EXAM: CHEST  2 VIEW COMPARISON:  December 24, 2003 FINDINGS: There is no edema or consolidation. The heart size and pulmonary vascularity are normal. No pneumothorax. No adenopathy. No bone lesions. IMPRESSION: No edema or consolidation. Electronically Signed   By: Lowella Grip III M.D.   On: 03/26/2015 14:08   Ct Angio Chest Pe W/cm &/or Wo Cm  04/02/2015  CLINICAL DATA:  69 year old male with shortness of breath and abnormal D-dimer. Initial encounter. EXAM: CT ANGIOGRAPHY CHEST WITH CONTRAST TECHNIQUE: Multidetector CT imaging of the chest was performed using the standard  protocol during bolus administration of intravenous contrast. Multiplanar CT image reconstructions and MIPs were obtained to evaluate the vascular anatomy. CONTRAST:  65mL OMNIPAQUE IOHEXOL 350 MG/ML SOLN COMPARISON:  Chest radiographs 0138 hours today, and earlier. CT Abdomen and Pelvis 03/30/2015. FINDINGS: Good contrast bolus timing in the pulmonary arterial tree. Mild lower lobe respiratory motion artifact. No focal filling defect identified in the pulmonary arterial tree to suggest the presence of acute pulmonary embolism. Major airways are patent. Linear atelectasis or scarring in both lower lobes is unchanged from the recent CT Abdomen and Pelvis. No pleural effusion. No other abnormal pulmonary opacity. No pericardial effusion. Negative thoracic inlet. Mild fusiform enlargement of the ascending aorta up to 40-43 mm diameter. The aorta tapers in the arch to 30 mm. No significant thoracic aortic atherosclerosis. Negative great vessel origins. Negative descending thoracic aorta. No definite coronary artery calcification. No mediastinal or hilar lymphadenopathy. Negative visualized abdominal aorta. No axillary lymphadenopathy. Stable and negative visualized upper abdomen. No acute osseous abnormality identified. Review of the MIP images confirms the above findings. IMPRESSION: 1.  No evidence of acute pulmonary embolus. 2. 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 No significant aortic atherosclerosis. Recommend annual imaging followup by CTA or MRA. This recommendation follows 2010 ACCF/AHA/AATS/ACR/ASA/SCA/SCAI/SIR/STS/SVM Guidelines for the Diagnosis and Management of Patients with Thoracic Aortic Disease. Circulation. 2010; 121: [id number] 3. Negative lungs aside from stable mild linear scarring or atelectasis in the lower lobes. Electronically Signed   By: Genevie Ann M.D.   On: 04/02/2015 07:24   US Abdomen Complete  04/10/2015  CLINICAL DATA:  Abdominal pain x2 months, difficulty  breathing EXAM: ULTRASOUND ABDOMEN COMPLETE COMPARISON:  CT abdomen pelvis dated 03/30/2015 FINDINGS: Gallbladder: No gallstones, gallbladder wall thickening, or pericholecystic fluid. Negative sonographic Murphy's sign. Common bile duct: Diameter: 3 mm Liver: No focal lesion identified. Within normal limits in parenchymal echogenicity. IVC: No abnormality visualized. Pancreas: Visualized portion unremarkable. Spleen: Size and appearance within normal limits. Right Kidney: Length: 9.0 cm.  No mass or hydronephrosis. Left Kidney: Length: 9.2 cm.  No mass or hydronephrosis. Abdominal aorta: No aneurysm visualized. Other findings: None. IMPRESSION: Negative abdominal ultrasound. Electronically Signed   By: Julian Hy M.D.   On: 04/10/2015 09:47   Ct Abdomen Pelvis W Contrast  03/30/2015  CLINICAL DATA:  Periumbilical pain radiating into the right lower quadrant EXAM: CT ABDOMEN AND PELVIS WITH CONTRAST TECHNIQUE: Multidetector CT imaging of the abdomen and pelvis was performed using the standard protocol following bolus administration of intravenous contrast. CONTRAST:  133mL OMNIPAQUE IOHEXOL 300 MG/ML  SOLN COMPARISON:  None. FINDINGS: The appendix is normal. Remainder the bowel is remarkable only for mild uncomplicated colonic diverticulosis. There are normal appearances of the liver, gallbladder, pancreas,  spleen, adrenals and kidneys. The abdominal aorta is normal in caliber with mild atherosclerotic calcification. There is marked distention of the urinary bladder, nearly to the level of the umbilicus. There is marked prostatic enlargement. There is no significant abnormality in the lower chest except for mild linear scarring or atelectasis posteriorly. No significant musculoskeletal lesion. IMPRESSION: 1. Normal appendix. No acute inflammatory changes in the abdomen or pelvis. 2. Distention of the urinary bladder, nearly to the level of the umbilicus. Marked prostatic enlargement. Electronically Signed    By: Andreas Newport M.D.   On: 03/30/2015 06:45   Nm Myocar Multi W/spect W/wall Motion / Ef  04/11/2015   There was no ST segment deviation noted during stress.  No T wave inversion was noted during stress.  Defect 1: There is a small defect of moderate severity present in the basal inferior and mid inferior location.  The study is normal.  This is a low risk study.  Nuclear stress EF: 70%.  The left ventricular ejection fraction is hyperdynamic (>65%).       ASSESSMENT: Jack [name]:    Negative stress test.  Normal EF by echo.  Feels while while walking here.  His SHOB may be related to his propane heater that he is using.  He feels he is inhaling a lot of smoke and is better here.  He should get a CO monitor for home.  He has alternate heating to use.  No further cardiac w/u at this time.    Jettie Booze, MD  04/11/2015  11:43 AM

## 2015-04-11 NOTE — Discharge Summary (Signed)
Physician Discharge Summary  Jack [name] [id number] DOB: [birth year] DOA: 04/09/2015  PCP: Cathlean Cower, MD  Admit date: 04/09/2015 Discharge date: 04/11/2015  Time spent: > 30 minutes  Recommendations for Outpatient Follow-up:  1. Follow up with Dr. Cathlean Cower in 1 week as scheduled   Discharge Diagnoses:  Principal Problem:   Dyspnea on exertion Active Problems:   Hyperlipidemia   SOB (shortness of breath)   Hypertension   Restrictive lung disease  Discharge Condition: stable  Diet recommendation: heart healthy  Filed Weights   04/09/15 1904 04/10/15 0436  Weight: 75.297 kg (166 lb) 72.6 kg (160 lb 0.9 oz)    History of present illness:  See H&P, Labs, Consult and Test reports for all details in brief, patient is a 69 y.o. male with history of hypertension hyperlipidemia has been experiencing shortness of breath over the last few weeks  Hospital Course:  Patient was admitted to the hospital with dyspnea which has been progressive over the past few weeks. Cardiology was consulted and have evaluated patient while hospitalized and he underwent a 2D echo which showed EF 65-70% with grade 1 diastolic dysfunction; he also underwent a stress test which was deemed to be normal and an overall a low risk study. Patient underwent PFTs which showed evidence of restrictive lung disease with response to bronchodilators however the testing was poor as patient was unable to perform FVC, SVC and DLCO adequately. CT angio scan of the chest prior to admission without evidence of PE and without significant lung pathology other than mild linear scarring or atelectasis in the lower lobes. In discussing with the patient it appears that his dyspnea has been worsening in the winter months and he is using a kerosene indoor heater and I am concerned about CO exposure, he was instructed to avoid unsafe heating and get a CO monitor for home. His dyspnea has improved in the hospital which might also  suggest home exposure. He was able to ambulate on room air without significant shortness of breath, and maintaining oxygen saturations > 94%. Given response to bronchodilators in his PFTs, will be sent home on ipratropium and albuterol. He is having difficulties in obtaining Combivent and will go on nebulizers. His other medical problems have remained stable and no other medication changes were made. Will likely benefit from Pulmonology referral as an outpatient and repeat PFTs.  Procedures:  2D echo  Stress test  PFTs   Consultations:  Cardiology   Discharge Exam: Filed Vitals:   04/10/15 1656 04/10/15 2132 04/11/15 0529 04/11/15 1120  BP:  103/64 117/67   Pulse:  67 87   Temp:  98.5 F (36.9 C) 98.1 F (36.7 C)   TempSrc:  Oral Oral   Resp: 18 18    Height:      Weight:      SpO2: 100% 99% 99% 94%    General: NAD Cardiovascular: RRR Respiratory: CTA biL  Discharge Instructions Activity:  As tolerated   Get Medicines reviewed and adjusted: Please take all your medications with you for your next visit with your Primary MD  Please request your Primary MD to go over all hospital tests and procedure/radiological results at the follow up, please ask your Primary MD to get all Hospital records sent to his/her office.  If you experience worsening of your admission symptoms, develop shortness of breath, life threatening emergency, suicidal or homicidal thoughts you must seek medical attention immediately by calling 911 or calling your MD immediately if symptoms  less severe.  You must read complete instructions/literature along with all the possible adverse reactions/side effects for all the Medicines you take and that have been prescribed to you. Take any new Medicines after you have completely understood and accpet all the possible adverse reactions/side effects.   Do not drive when taking Pain medications.   Do not take more than prescribed Pain, Sleep and Anxiety  Medications  Special Instructions: If you have smoked or chewed Tobacco in the last 2 yrs please stop smoking, stop any regular Alcohol and or any Recreational drug use.  Wear Seat belts while driving.  Please note  You were cared for by a hospitalist during your hospital stay. Once you are discharged, your primary care physician will handle any further medical issues. Please note that NO REFILLS for any discharge medications will be authorized once you are discharged, as it is imperative that you return to your primary care physician (or establish a relationship with a primary care physician if you do not have one) for your aftercare needs so that they can reassess your need for medications and monitor your lab values.    Medication List    STOP taking these medications        COMBIVENT RESPIMAT 20-100 MCG/ACT Aers respimat  Generic drug:  Ipratropium-Albuterol      TAKE these medications        albuterol 108 (90 BASE) MCG/ACT inhaler  Commonly known as:  PROVENTIL HFA;VENTOLIN HFA  Inhale 2 puffs into the lungs every 6 (six) hours as needed for wheezing or shortness of breath.     albuterol (2.5 MG/3ML) 0.083% nebulizer solution  Commonly known as:  PROVENTIL  Take 3 mLs (2.5 mg total) by nebulization every 6 (six) hours as needed for wheezing.     atenolol 25 MG tablet  Commonly known as:  TENORMIN  Take 1 tablet (25 mg total) by mouth daily.     cetirizine 10 MG tablet  Commonly known as:  ZYRTEC  Take 1 tablet (10 mg total) by mouth daily.     finasteride 5 MG tablet  Commonly known as:  PROSCAR  Take 1 tablet (5 mg total) by mouth daily.     hydrOXYzine 25 MG tablet  Commonly known as:  ATARAX/VISTARIL  Take 1 tablet (25 mg total) by mouth 3 (three) times daily as needed.     ipratropium 0.02 % nebulizer solution  Commonly known as:  ATROVENT  Take 2.5 mLs (0.5 mg total) by nebulization 3 (three) times daily.     lisinopril-hydrochlorothiazide 20-12.5 MG  tablet  Commonly known as:  PRINZIDE,ZESTORETIC  Take 1 tablet by mouth daily.     simvastatin 40 MG tablet  Commonly known as:  ZOCOR  Take 1 tablet (40 mg total) by mouth daily at 6 PM.           Follow-up Information    Follow up with Cathlean Cower, MD. Schedule an appointment as soon as possible for a visit on 04/18/2015.   Specialties:  Internal Medicine, Radiology   Why:  pt app is on 04/18/15 @8 :15am    Contact information:   [place] [address] [phone]       Follow up with [facility]. Schedule an appointment as soon as possible for a visit on 05/14/2015.   Specialty:  Pulmonology   Why:  pt app is on 05/14/15 @9 :00 with Dr. Myrtice Lauth information:   [address]  [place] [phone]      Follow up with [place].   Why:  nebulizer machine   Contact information:   [address] [place] [address] [phone]       The results of significant diagnostics from this hospitalization (including imaging, microbiology, ancillary and laboratory) are listed below for reference.    Significant Diagnostic Studies: Dg Chest 2 View  04/09/2015  CLINICAL DATA:  Shortness of breath for 2 weeks.  Initial encounter. EXAM: CHEST  2 VIEW COMPARISON:  CT chest and PA and lateral chest 04/04/2015. FINDINGS: The lungs are clear. Heart size is normal. There is no pneumothorax or pleural effusion. IMPRESSION: No acute abnormality. Electronically Signed   By: Inge Rise M.D.   On: 04/09/2015 20:02   Dg Chest 2 View  04/02/2015  CLINICAL DATA:  Initial evaluation for acute shortness of breath. EXAM: CHEST  2 VIEW COMPARISON:  Prior radiograph from 03/26/2015. FINDINGS: The cardiac and mediastinal silhouettes are stable in size and contour, and remain within normal limits. The lungs are normally inflated. No airspace consolidation, pleural effusion, or pulmonary edema is identified.  There is no pneumothorax. No acute osseous abnormality identified. IMPRESSION: No active cardiopulmonary disease. Electronically Signed   By: Jeannine Boga M.D.   On: 04/02/2015 02:30   Dg Chest 2 View  03/26/2015  CLINICAL DATA:  Two week history of chest tightness. EXAM: CHEST  2 VIEW COMPARISON:  December 24, 2003 FINDINGS: There is no edema or consolidation. The heart size and pulmonary vascularity are normal. No pneumothorax. No adenopathy. No bone lesions. IMPRESSION: No edema or consolidation. Electronically Signed   By: Lowella Grip III M.D.   On: 03/26/2015 14:08   Ct Angio Chest Pe W/cm &/or Wo Cm  04/02/2015  CLINICAL DATA:  69 year old male with shortness of breath and abnormal D-dimer. Initial encounter. EXAM: CT ANGIOGRAPHY CHEST WITH CONTRAST TECHNIQUE: Multidetector CT imaging of the chest was performed using the standard protocol during bolus administration of intravenous contrast. Multiplanar CT image reconstructions and MIPs were obtained to evaluate the vascular anatomy. CONTRAST:  68mL OMNIPAQUE IOHEXOL 350 MG/ML SOLN COMPARISON:  Chest radiographs 0138 hours today, and earlier. CT Abdomen and Pelvis 03/30/2015. FINDINGS: Good contrast bolus timing in the pulmonary arterial tree. Mild lower lobe respiratory motion artifact. No focal filling defect identified in the pulmonary arterial tree to suggest the presence of acute pulmonary embolism. Major airways are patent. Linear atelectasis or scarring in both lower lobes is unchanged from the recent CT Abdomen and Pelvis. No pleural effusion. No other abnormal pulmonary opacity. No pericardial effusion. Negative thoracic inlet. Mild fusiform enlargement of the ascending aorta up to 40-43 mm diameter. The aorta tapers in the arch to 30 mm. No significant thoracic aortic atherosclerosis. Negative great vessel origins. Negative descending thoracic aorta. No definite coronary artery calcification. No mediastinal or hilar lymphadenopathy.  Negative visualized abdominal aorta. No axillary lymphadenopathy. Stable and negative visualized upper abdomen. No acute osseous abnormality identified. Review of the MIP images confirms the above findings. IMPRESSION: 1.  No evidence of acute pulmonary embolus. 2. 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 No significant aortic atherosclerosis. Recommend annual imaging followup by CTA or MRA. This recommendation follows 2010 ACCF/AHA/AATS/ACR/ASA/SCA/SCAI/SIR/STS/SVM Guidelines for the Diagnosis and Management of Patients with Thoracic Aortic Disease. Circulation. 2010; 121: [id number] 3. Negative lungs aside from stable mild linear scarring or atelectasis in the lower lobes. Electronically Signed  By: Genevie Ann M.D.   On: 04/02/2015 07:24   US Abdomen Complete  04/10/2015  CLINICAL DATA:  Abdominal pain x2 months, difficulty breathing EXAM: ULTRASOUND ABDOMEN COMPLETE COMPARISON:  CT abdomen pelvis dated 03/30/2015 FINDINGS: Gallbladder: No gallstones, gallbladder wall thickening, or pericholecystic fluid. Negative sonographic Murphy's sign. Common bile duct: Diameter: 3 mm Liver: No focal lesion identified. Within normal limits in parenchymal echogenicity. IVC: No abnormality visualized. Pancreas: Visualized portion unremarkable. Spleen: Size and appearance within normal limits. Right Kidney: Length: 9.0 cm.  No mass or hydronephrosis. Left Kidney: Length: 9.2 cm.  No mass or hydronephrosis. Abdominal aorta: No aneurysm visualized. Other findings: None. IMPRESSION: Negative abdominal ultrasound. Electronically Signed   By: Julian Hy M.D.   On: 04/10/2015 09:47   Ct Abdomen Pelvis W Contrast  03/30/2015  CLINICAL DATA:  Periumbilical pain radiating into the right lower quadrant EXAM: CT ABDOMEN AND PELVIS WITH CONTRAST TECHNIQUE: Multidetector CT imaging of the abdomen and pelvis was performed using the standard protocol following bolus administration of intravenous contrast.  CONTRAST:  163mL OMNIPAQUE IOHEXOL 300 MG/ML  SOLN COMPARISON:  None. FINDINGS: The appendix is normal. Remainder the bowel is remarkable only for mild uncomplicated colonic diverticulosis. There are normal appearances of the liver, gallbladder, pancreas, spleen, adrenals and kidneys. The abdominal aorta is normal in caliber with mild atherosclerotic calcification. There is marked distention of the urinary bladder, nearly to the level of the umbilicus. There is marked prostatic enlargement. There is no significant abnormality in the lower chest except for mild linear scarring or atelectasis posteriorly. No significant musculoskeletal lesion. IMPRESSION: 1. Normal appendix. No acute inflammatory changes in the abdomen or pelvis. 2. Distention of the urinary bladder, nearly to the level of the umbilicus. Marked prostatic enlargement. Electronically Signed   By: Andreas Newport M.D.   On: 03/30/2015 06:45   Nm Myocar Multi W/spect W/wall Motion / Ef  04/11/2015   There was no ST segment deviation noted during stress.  No T wave inversion was noted during stress.  Defect 1: There is a small defect of moderate severity present in the basal inferior and mid inferior location.  The study is normal.  This is a low risk study.  Nuclear stress EF: 70%.  The left ventricular ejection fraction is hyperdynamic (>65%).     Microbiology: No results found for this or any previous visit (from the past 240 hour(s)).   Labs: Basic Metabolic Panel:  Recent Labs Lab 04/09/15 2019 04/10/15 0608 04/11/15 0318  NA 138 137 136  K 3.8 4.0 4.1  CL 105 103 103  CO2 24 23 25   GLUCOSE 105* 126* 85  BUN 15 18 26*  CREATININE 1.28* 1.31* 1.25*  CALCIUM 9.5 9.6 9.4   Liver Function Tests:  Recent Labs Lab 04/10/15 0608  AST 24  ALT 22  ALKPHOS 62  BILITOT 0.8  PROT 7.0  ALBUMIN 3.4*    Recent Labs Lab 04/10/15 0608  LIPASE 34   CBC:  Recent Labs Lab 04/09/15 2019 04/10/15 0608  04/11/15 0318  WBC 12.7* 10.7* 12.5*  NEUTROABS 9.4* 9.4*  --   HGB 13.2 13.4 12.5*  HCT 38.8* 40.1 37.3*  MCV 93.9 94.6 95.4  PLT 233 243 230   Cardiac Enzymes:  Recent Labs Lab 04/10/15 0608 04/10/15 1046 04/10/15 1610  TROPONINI <0.03 <0.03 0.03   BNP: BNP (last 3 results)  Recent Labs  04/09/15 2016  BNP 29.2     Signed:  Shanara Schnieders  [facility] 04/11/2015, 3:45 PM

## 2015-04-11 NOTE — Discharge Instructions (Signed)
Follow with James Iyan, MD in 5-7 days ° °Please get a complete blood count and chemistry panel checked by your Primary MD at your next visit, and again as instructed by your Primary MD. Please get your medications reviewed and adjusted by your Primary MD. ° °Please request your Primary MD to go over all Hospital Tests and Procedure/Radiological results at the follow up, please get all Hospital records sent to your Prim MD by signing hospital release before you go home. ° °If you had Pneumonia of Lung problems at the Hospital: °Please get a 2 view Chest X ray done in 6-8 weeks after hospital discharge or sooner if instructed by your Primary MD. ° °If you have Congestive Heart Failure: °Please call your Cardiologist or Primary MD anytime you have any of the following symptoms:  °1) 3 pound weight gain in 24 hours or 5 pounds in 1 week  °2) shortness of breath, with or without a dry hacking cough  °3) swelling in the hands, feet or stomach  °4) if you have to sleep on extra pillows at night in order to breathe ° °Follow cardiac low salt diet and 1.5 lit/day fluid restriction. ° °If you have diabetes °Accuchecks 4 times/day, Once in AM empty stomach and then before each meal. °Log in all results and show them to your primary doctor at your next visit. °If any glucose reading is under 80 or above 300 call your primary MD immediately. ° °If you have Seizure/Convulsions/Epilepsy: °Please do not drive, operate heavy machinery, participate in activities at heights or participate in high speed sports until you have seen by Primary MD or a Neurologist and advised to do so again. ° °If you had Gastrointestinal Bleeding: °Please ask your Primary MD to check a complete blood count within one week of discharge or at your next visit. Your endoscopic/colonoscopic biopsies that are pending at the time of discharge, will also need to followed by your Primary MD. ° °Get Medicines reviewed and adjusted. °Please take all your  medications with you for your next visit with your Primary MD ° °Please request your Primary MD to go over all hospital tests and procedure/radiological results at the follow up, please ask your Primary MD to get all Hospital records sent to his/her office. ° °If you experience worsening of your admission symptoms, develop shortness of breath, life threatening emergency, suicidal or homicidal thoughts you must seek medical attention immediately by calling 911 or calling your MD immediately  if symptoms less severe. ° °You must read complete instructions/literature along with all the possible adverse reactions/side effects for all the Medicines you take and that have been prescribed to you. Take any new Medicines after you have completely understood and accpet all the possible adverse reactions/side effects.  ° °Do not drive or operate heavy machinery when taking Pain medications.  ° °Do not take more than prescribed Pain, Sleep and Anxiety Medications ° °Special Instructions: If you have smoked or chewed Tobacco  in the last 2 yrs please stop smoking, stop any regular Alcohol  and or any Recreational drug use. ° °Wear Seat belts while driving. ° °Please note °You were cared for by a hospitalist during your hospital stay. If you have any questions about your discharge medications or the care you received while you were in the hospital after you are discharged, you can call the unit and asked to speak with the hospitalist on call if the hospitalist that took care of you is not available. Once   you are discharged, your primary care physician will handle any further medical issues. Please note that NO REFILLS for any discharge medications will be authorized once you are discharged, as it is imperative that you return to your primary care physician (or establish a relationship with a primary care physician if you do not have one) for your aftercare needs so that they can reassess your need for medications and monitor your  lab values.  You can reach the hospitalist office at phone [phone] or fax [phone]   If you do not have a primary care physician, you can call [phone] for a physician referral.  Activity: As tolerated with Full fall precautions use walker/cane & assistance as needed  Disposition Home

## 2015-04-11 NOTE — Progress Notes (Signed)
Discussed with the patient and his wife and all questioned fully answered. He will call me if any problems arise.   Zowie Lundahl S [place], RN   

## 2015-04-11 NOTE — Care Management Obs Status (Signed)
[facility] NOTIFICATION   Patient Details  Name: Jack [name] MRN: [id number] Date of Birth: 05/28/1945   Medicare Observation Status Notification Given:  Yes    Maryclare Labrador, RN 04/11/2015, 10:43 AM

## 2015-04-11 NOTE — Care Management Note (Signed)
Case Management Note  Patient Details  Name: Jack [name] MRN: [id number] Date of Birth: 13-Sep-1945  Subjective/Objective:        Pt admitted with dyspnea on exertion            Action/Plan:  Pt is independent from home with wife.  Pt stated he has hardship paying for Combivent as its not covered under his insurance plan, CM made request of MD for a cheaper option for pt.  CM will continue to monitor for disposition needs.   Expected Discharge Date:                  Expected Discharge Plan:  Home/Self Care (independent from home with wife)  In-House Referral:     Discharge planning Services     Post Acute Care Choice:    Choice offered to:     DME Arranged:    DME Agency:     HH Arranged:    [place] Agency:  [place]  Status of Service:     Medicare Important Message Given:    Date Medicare IM Given:    Medicare IM give by:    Date Additional Medicare IM Given:    Additional Medicare Important Message give by:     If discussed at [place] of Stay Meetings, dates discussed:    Additional Comments: 04/11/2015 CM assessed pt; MD will change to nebulizer treatment to reduce cost with Combivent.  CM offered pt choice for nebulizer, pt chose [facility], agency contacted and referral accepted.  04/10/15 CM assessed pt Maryclare Labrador, RN 04/11/2015, 11:46 AM

## 2015-04-11 NOTE — Progress Notes (Signed)
Pt ambulated 350 feet on room air. Pt maintained oxygen saturation of 94% and HR of 89-92. Pt denies SOB or dyspnea.   Fritz Pickerel, RN

## 2015-04-12 ENCOUNTER — Telehealth: Payer: Self-pay | Admitting: *Deleted

## 2015-04-12 ENCOUNTER — Emergency Department (HOSPITAL_COMMUNITY)
Admission: EM | Admit: 2015-04-12 | Discharge: 2015-04-12 | Disposition: A | Discharge status: Home / Self Care | Payer: Commercial Managed Care - HMO | Attending: Emergency Medicine | Admitting: Emergency Medicine

## 2015-04-12 ENCOUNTER — Emergency Department (HOSPITAL_COMMUNITY): Payer: Commercial Managed Care - HMO

## 2015-04-12 ENCOUNTER — Encounter (HOSPITAL_COMMUNITY): Payer: Self-pay

## 2015-04-12 DIAGNOSIS — I10 Essential (primary) hypertension: Secondary | ICD-10-CM | POA: Insufficient documentation

## 2015-04-12 DIAGNOSIS — R0600 Dyspnea, unspecified: Secondary | ICD-10-CM | POA: Diagnosis present

## 2015-04-12 DIAGNOSIS — R0602 Shortness of breath: Secondary | ICD-10-CM | POA: Diagnosis not present

## 2015-04-12 DIAGNOSIS — Z8719 Personal history of other diseases of the digestive system: Secondary | ICD-10-CM | POA: Diagnosis not present

## 2015-04-12 DIAGNOSIS — G8929 Other chronic pain: Secondary | ICD-10-CM | POA: Insufficient documentation

## 2015-04-12 DIAGNOSIS — Z8619 Personal history of other infectious and parasitic diseases: Secondary | ICD-10-CM | POA: Insufficient documentation

## 2015-04-12 DIAGNOSIS — J948 Other specified pleural conditions: Secondary | ICD-10-CM | POA: Diagnosis not present

## 2015-04-12 DIAGNOSIS — Z8739 Personal history of other diseases of the musculoskeletal system and connective tissue: Secondary | ICD-10-CM | POA: Diagnosis not present

## 2015-04-12 DIAGNOSIS — Z87891 Personal history of nicotine dependence: Secondary | ICD-10-CM | POA: Insufficient documentation

## 2015-04-12 DIAGNOSIS — E785 Hyperlipidemia, unspecified: Secondary | ICD-10-CM | POA: Diagnosis not present

## 2015-04-12 DIAGNOSIS — Z79899 Other long term (current) drug therapy: Secondary | ICD-10-CM | POA: Insufficient documentation

## 2015-04-12 DIAGNOSIS — J984 Other disorders of lung: Secondary | ICD-10-CM

## 2015-04-12 LAB — HEPATIC FUNCTION PANEL
ALT: 35 U/L (ref 17–63)
AST: 50 U/L — ABNORMAL HIGH (ref 15–41)
Albumin: 3.6 g/dL (ref 3.5–5.0)
Alkaline Phosphatase: 64 U/L (ref 38–126)
BILIRUBIN DIRECT: 0.2 mg/dL (ref 0.1–0.5)
BILIRUBIN INDIRECT: 0.6 mg/dL (ref 0.3–0.9)
TOTAL PROTEIN: 7.1 g/dL (ref 6.5–8.1)
Total Bilirubin: 0.8 mg/dL (ref 0.3–1.2)

## 2015-04-12 LAB — CBC WITH DIFFERENTIAL/PLATELET
BASOS ABS: 0 10*3/uL (ref 0.0–0.1)
BASOS PCT: 0 %
EOS PCT: 0 %
Eosinophils Absolute: 0 10*3/uL (ref 0.0–0.7)
HCT: 38.6 % — ABNORMAL LOW (ref 39.0–52.0)
Hemoglobin: 12.9 g/dL — ABNORMAL LOW (ref 13.0–17.0)
LYMPHS PCT: 22 %
Lymphs Abs: 2.5 10*3/uL (ref 0.7–4.0)
MCH: 31.7 pg (ref 26.0–34.0)
MCHC: 33.4 g/dL (ref 30.0–36.0)
MCV: 94.8 fL (ref 78.0–100.0)
Monocytes Absolute: 0.7 10*3/uL (ref 0.1–1.0)
Monocytes Relative: 6 %
Neutro Abs: 8.3 10*3/uL — ABNORMAL HIGH (ref 1.7–7.7)
Neutrophils Relative %: 72 %
PLATELETS: 198 10*3/uL (ref 150–400)
RBC: 4.07 MIL/uL — AB (ref 4.22–5.81)
RDW: 12.6 % (ref 11.5–15.5)
WBC: 11.6 10*3/uL — AB (ref 4.0–10.5)

## 2015-04-12 LAB — I-STAT TROPONIN, ED: TROPONIN I, POC: 0.01 ng/mL (ref 0.00–0.08)

## 2015-04-12 LAB — BASIC METABOLIC PANEL
ANION GAP: 8 (ref 5–15)
BUN: 33 mg/dL — AB (ref 6–20)
CALCIUM: 9.4 mg/dL (ref 8.9–10.3)
CO2: 25 mmol/L (ref 22–32)
Chloride: 104 mmol/L (ref 101–111)
Creatinine, Ser: 1.44 mg/dL — ABNORMAL HIGH (ref 0.61–1.24)
GFR calc Af Amer: 56 mL/min — ABNORMAL LOW (ref >60)
GFR, EST NON AFRICAN AMERICAN: 48 mL/min — AB (ref >60)
Glucose, Bld: 138 mg/dL — ABNORMAL HIGH (ref 65–99)
POTASSIUM: 3.7 mmol/L (ref 3.5–5.1)
SODIUM: 137 mmol/L (ref 135–145)

## 2015-04-12 LAB — LIPASE, BLOOD: LIPASE: 51 U/L (ref 11–51)

## 2015-04-12 LAB — BRAIN NATRIURETIC PEPTIDE: B NATRIURETIC PEPTIDE 5: 11.5 pg/mL (ref 0.0–100.0)

## 2015-04-12 MED ORDER — HYDROXYZINE HCL 25 MG PO TABS: 25.0000 mg | ORAL_TABLET | Freq: Three times a day (TID) | ORAL | Status: DC | PRN

## 2015-04-12 MED ORDER — PREDNISONE 20 MG PO TABS: 40.0000 mg | ORAL_TABLET | Freq: Every day | ORAL | Status: AC

## 2015-04-12 MED ORDER — LISINOPRIL-HYDROCHLOROTHIAZIDE 20-12.5 MG PO TABS: 1.0000 | ORAL_TABLET | Freq: Every day | ORAL | Status: DC

## 2015-04-12 MED ORDER — FINASTERIDE 5 MG PO TABS
5.0000 mg | ORAL_TABLET | Freq: Every day | ORAL | Status: DC
Start: 2015-04-12 — End: 2015-04-12
  Filled 2015-04-12: qty 1

## 2015-04-12 MED ORDER — IPRATROPIUM-ALBUTEROL 0.5-2.5 (3) MG/3ML IN SOLN
3.0000 mL | Freq: Once | RESPIRATORY_TRACT | Status: AC
  Administered 2015-04-12: 3 mL via RESPIRATORY_TRACT
  Filled 2015-04-12: qty 3

## 2015-04-12 MED ORDER — ATENOLOL 25 MG PO TABS: 25.0000 mg | ORAL_TABLET | Freq: Every day | ORAL | Status: DC

## 2015-04-12 MED ORDER — HYDROCHLOROTHIAZIDE 12.5 MG PO CAPS: 12.5000 mg | ORAL_CAPSULE | Freq: Every day | ORAL | Status: DC

## 2015-04-12 MED ORDER — ALBUTEROL SULFATE HFA 108 (90 BASE) MCG/ACT IN AERS: 2.0000 | INHALATION_SPRAY | Freq: Four times a day (QID) | RESPIRATORY_TRACT | Status: DC | PRN

## 2015-04-12 MED ORDER — LORATADINE 10 MG PO TABS: 10.0000 mg | ORAL_TABLET | Freq: Every day | ORAL | Status: DC

## 2015-04-12 MED ORDER — PREDNISONE 20 MG PO TABS
40.0000 mg | ORAL_TABLET | Freq: Every day | ORAL | Status: DC
  Administered 2015-04-12: 40 mg via ORAL
  Filled 2015-04-12: qty 2

## 2015-04-12 MED ORDER — LISINOPRIL 20 MG PO TABS: 20.0000 mg | ORAL_TABLET | Freq: Every day | ORAL | Status: DC

## 2015-04-12 MED ORDER — SODIUM CHLORIDE 0.9 % IV BOLUS (SEPSIS)
1000.0000 mL | Freq: Once | INTRAVENOUS | Status: AC
  Administered 2015-04-12: 1000 mL via INTRAVENOUS

## 2015-04-12 MED ORDER — SIMVASTATIN 40 MG PO TABS
40.0000 mg | ORAL_TABLET | Freq: Every day | ORAL | Status: DC
Start: 2015-04-12 — End: 2015-04-12
  Filled 2015-04-12: qty 1

## 2015-04-12 MED ORDER — ALBUTEROL SULFATE (2.5 MG/3ML) 0.083% IN NEBU
2.5000 mg | INHALATION_SOLUTION | Freq: Four times a day (QID) | RESPIRATORY_TRACT | Status: DC | PRN
Start: 2015-04-12 — End: 2015-04-12

## 2015-04-12 NOTE — ED Notes (Signed)
Pt sister came to RN and expressed concerns regarding pt stating "If I had a gun I would just kill myself."  Sister reports that pt has numerous guns in home.  MD made aware and will come see pt.

## 2015-04-12 NOTE — ED Notes (Signed)
Staffing called for sitter- family remains with pt at this time.

## 2015-04-12 NOTE — ED Provider Notes (Signed)
CSN: [id number]     Arrival date & time 04/12/15  [id number] History   First MD Initiated Contact with Patient 04/12/15 0848     Chief Complaint  Patient presents with  . Respiratory Distress     (Consider location/radiation/quality/duration/timing/severity/associated sxs/prior Treatment) HPI  69 year old male presents with shortness of breath. He was discharged from the hospital yesterday for the same. He states he was still short of breath when he left the hospital but was improved. Patient states that he was given medicine to help him have a bowel movement. Last night when he had a bowel movement and shortness of breath seemed to get worse. It has been worse since. EMS gave him albuterol and 125 mg Solu-Medrol. The patient chronically is not on oxygen. No chest pain, cough, or fevers. Denies leg swelling. No orthopnea.   Past Medical History  Diagnosis Date  . ALLERGIC RHINITIS 02/16/2007  . BACK PAIN 02/16/2007  . FOOT PAIN, CHRONIC 02/27/2008  . 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 HYPERLIPIDEMIA 02/16/2007  . HYPERTENSION 02/16/2007  . PROSTATE SPECIFIC ANTIGEN, ELEVATED 02/16/2007  . 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 Diverticulosis 09/27/2013  . Shortness of breath dyspnea    Past Surgical History  Procedure Laterality Date  . Foot surgery      reconstruction x's 4 after work accident   Family History  Problem Relation Age of Onset  . Cancer Mother     throat & lung  . Coronary artery disease Father   . Hypertension Father   . Cancer Sister   . Alcohol abuse Brother    Social History  Substance Use Topics  . Smoking status: Former Research scientist (life sciences)  . Smokeless tobacco: Never Used     Comment: quit smoking [id number]  . Alcohol Use: No     Comment: no    Review of Systems  Constitutional: Negative for fever.  Respiratory: Positive for shortness of breath. Negative for cough.   Cardiovascular: Negative for chest pain and leg swelling.  All other systems reviewed and are  negative.     Allergies  Review of patient's allergies indicates no known allergies.  Home Medications   Prior to Admission medications   Medication Sig Start Date End Date Taking? Authorizing Provider  albuterol (PROVENTIL HFA;VENTOLIN HFA) 108 (90 BASE) MCG/ACT inhaler Inhale 2 puffs into the lungs every 6 (six) hours as needed for wheezing or shortness of breath. 04/02/15   Biagio Borg, MD  albuterol (PROVENTIL) (2.5 MG/3ML) 0.083% nebulizer solution Take 3 mLs (2.5 mg total) by nebulization every 6 (six) hours as needed for wheezing. 04/11/15   Costin Karlyne Greenspan, MD  atenolol (TENORMIN) 25 MG tablet Take 1 tablet (25 mg total) by mouth daily. 06/26/14   Philemon Kingdom, MD  cetirizine (ZYRTEC) 10 MG tablet Take 1 tablet (10 mg total) by mouth daily. 03/26/15   Noemi Chapel, MD  finasteride (PROSCAR) 5 MG tablet Take 1 tablet (5 mg total) by mouth daily. 09/27/13   Biagio Borg, MD  hydrOXYzine (ATARAX/VISTARIL) 25 MG tablet Take 1 tablet (25 mg total) by mouth 3 (three) times daily as needed. Patient taking differently: Take 25 mg by mouth 3 (three) times daily as needed for anxiety.  04/03/15   Biagio Borg, MD  ipratropium (ATROVENT) 0.02 % nebulizer solution Take 2.5 mLs (0.5 mg total) by nebulization 3 (three) times daily. 04/11/15   Costin Karlyne Greenspan, MD  lisinopril-hydrochlorothiazide (PRINZIDE,ZESTORETIC) 20-12.5 MG per tablet Take 1 tablet by  mouth daily. 10/04/14   Biagio Borg, MD  simvastatin (ZOCOR) 40 MG tablet Take 1 tablet (40 mg total) by mouth daily at 6 PM. 10/18/14   Biagio Borg, MD   BP 133/74 mmHg  Pulse 112  Temp(Src) 98.5 F (36.9 C) (Oral)  Resp 27  Ht 5\' 4"  (1.626 m)  Wt 158 lb (71.668 kg)  BMI 27.11 kg/m2  SpO2 96% Physical Exam  Constitutional: He is oriented to person, place, and time. He appears well-developed and well-nourished.  HENT:  Head: Normocephalic and atraumatic.  Right Ear: External ear normal.  Left Ear: External ear normal.  Nose: Nose  normal.  Eyes: Right eye exhibits no discharge. Left eye exhibits no discharge.  Neck: Neck supple.  Cardiovascular: Normal rate, regular rhythm, normal heart sounds and intact distal pulses.   Pulmonary/Chest: Effort normal. He has decreased breath sounds (diffusely, difficult to discern if from bronchospasm or how he is breathing in which is quick and shallow only when asked to breathe for exam).  Patient speaks in complete sentences, no signs of increased work of breathing.  Abdominal: Soft. There is no tenderness.  Musculoskeletal: He exhibits no edema.  Neurological: He is alert and oriented to person, place, and time.  Skin: Skin is warm and dry.  Nursing note and vitals reviewed.   ED Course  Procedures (including critical care time) Labs Review Labs Reviewed  BASIC METABOLIC PANEL - Abnormal; Notable for the following:    Glucose, Bld 138 (*)    BUN 33 (*)    Creatinine, Ser 1.44 (*)    GFR calc non Af Amer 48 (*)    GFR calc Af Amer 56 (*)    All other components within normal limits  CBC WITH DIFFERENTIAL/PLATELET - Abnormal; Notable for the following:    WBC 11.6 (*)    RBC 4.07 (*)    Hemoglobin 12.9 (*)    HCT 38.6 (*)    Neutro Abs 8.3 (*)    All other components within normal limits  HEPATIC FUNCTION PANEL - Abnormal; Notable for the following:    AST 50 (*)    All other components within normal limits  BRAIN NATRIURETIC PEPTIDE  LIPASE, BLOOD  URINALYSIS, ROUTINE W REFLEX MICROSCOPIC (NOT AT [facility])  URINE RAPID DRUG SCREEN, HOSP PERFORMED  I-STAT TROPOININ, ED    Imaging Review Dg Chest 2 View  04/12/2015  CLINICAL DATA:  Shortness of breath. EXAM: CHEST  2 VIEW COMPARISON:  04/09/2015 FINDINGS: Ectatic aortic arch. Heart size normal. The lungs appear clear. No pleural effusion. Mild thoracic spondylosis. IMPRESSION: 1. Ectatic aortic arch. 2. Mild thoracic spondylosis. Electronically Signed   By: Van Clines M.D.   On: 04/12/2015 09:39   I have  personally reviewed and evaluated these images and lab results as part of my medical decision-making.   EKG Interpretation   Date/Time:  Friday April 12 2015 08:43:36 EST Ventricular Rate:  113 PR Interval:  145 QRS Duration: 95 QT Interval:  313 QTC Calculation: 429 R Axis:   92 Text Interpretation:  Sinus tachycardia Right atrial enlargement Right  axis deviation rate is faster, otherwise no significant change since Apr 09 2015 Confirmed by [name]  MD, [place] ([id number]) on 04/12/2015 9:08:47 AM      MDM   Final diagnoses:  Dyspnea  Restrictive lung disease    Patient with reported dyspnea from home. Discharge from hospital yesterday. He is speaking in complete sentences and his oxygen saturation is normal.  Initially tachycardic but this slowly down trended. X-rays unremarkable. He had PFTs concerning for strict of lung disease in the hospital. Best I can tell he was not treated with steroids. We will treat with steroids and continued albuterol. I clarified that he needs a beat oral every 4 hours which he did not seem to know. While he is tachycardic I have very low suspicion for PE as he has been to the hospital multiple times with what he says is exactly the same presentation and had a negative PE CT scan earlier. He is now complaining of abdominal pain that only hurts whenever he pushes in on his abdomen. There is no pain at rest. Has also had a workup for this that is unremarkable. Patient appears to be frustrated overall with his decline and progressive shortness of breath. He even has mentioned suicide and has guns at home. Due to this psych was consulted. The guns are apparently locked up and after psychiatry'sevaluation they feel he is stable for discharge. Appears stable for discharge from a pulmonary perspective. He refused to walk with a pulse ox but does not appear short of breath at the time of discharge.    Sherwood Gambler, MD 04/12/15 [phone]

## 2015-04-12 NOTE — ED Notes (Signed)
Pt wanded by security. 

## 2015-04-12 NOTE — ED Notes (Signed)
Pt signed no harm contract. Family present at bedside with patient.

## 2015-04-12 NOTE — ED Notes (Signed)
Myself and [place], RN undressed patient, placed in gown, on monitor, continuous pulse oximetry and blood pressure cuff; patient arrived via [facility] on breathing treatment

## 2015-04-12 NOTE — ED Notes (Signed)
MD at bedside. 

## 2015-04-12 NOTE — Discharge Instructions (Signed)
Use your albuterol nebulizer every 4 hours as needed for shortness of breath or wheezing. Take the steroids to completion, starting tomorrow. If your shortness of breath worsened despite these measures, return to the ER or call your doctor.

## 2015-04-12 NOTE — Telephone Encounter (Signed)
Transition Care Management Follow-up Telephone Call   Date discharged? 04/11/15   How have you been since you were released from the hospital? Pt states he is doing alright   Do you understand why you were in the hospital? YES   Do you understand the discharge instructions? YES   Where were you discharged to? Home   Items Reviewed:  Medications reviewed: YES  Allergies reviewed: YES  Dietary changes reviewed: YES, heart healthy  Referrals reviewed: YES   Functional Questionnaire:   Activities of Daily Living (ADLs):   He states he are independent in the following: ambulation, bathing and hygiene, feeding, continence, grooming, toileting and dressing States he require assistance with the following: ambulation sometimes   Any transportation issues/concerns?: NO   Any patient concerns? NO   Confirmed importance and date/time of follow-up visits scheduled YES, hospital had made appt before he was d/c advise pt to keep appt for 04/18/15  Provider Appointment booked with Dr. Jenny Reichmann  Confirmed with patient if condition begins to worsen call PCP or go to the ER.  Patient was given the office number and encouraged to call back with question or concerns.  : YES

## 2015-04-12 NOTE — BH Assessment (Addendum)
Tele Assessment Note   Jack [name] is an 69 y.o. male that presents this date to [facility] for breathing issues and was just released yesterday from the hospital for the same on going health issue/s. Patient stated while in ED this date, that he is so frustrated with his health issue/s that he is going home when released and, "Melinda Crutch his brains out." Patient made this statement in front of his wife Jack [name]) who was present and son Jack [name].) who was also present in the ED. When questioned by this writer patient stated he had no intent of harming himself and was just very frustrated over his on going declining health. Patient denied any hx. of previous suicide attempts and also denied any SA use or family hx. of suicide. Patient also stated he has never been diagnosed with any mental health disorder but does report some depression associated with his declining health, but stated he would never harm himself due to his love for his family and faith. Patient stated he did have firearms at his residence but they were locked in a cabinet and this writer witness that the son Jack [name].) was in possession of the keys and would not allow patient to access them. Collateral information obtained from family members present stated they felt patient would not harm himself but would monitor his activity if released to home this date. This Probation officer contacted Jack [name] M.D. To inform of disposition. This Probation officer will also contact the MSW at [facility] to possibly give patient some information on outpatient resources and stress reduction. Patient does not meet criteria for inpatient admission this date.                 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 ALLERGIC RHINITIS 02/16/2007  . BACK PAIN 02/16/2007   . FOOT PAIN, CHRONIC 02/27/2008  . 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 HYPERLIPIDEMIA 02/16/2007  . HYPERTENSION 02/16/2007  . PROSTATE SPECIFIC ANTIGEN, ELEVATED 02/16/2007  . 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 Diverticulosis 09/27/2013  . Shortness of breath dyspnea     Past Surgical History  Procedure Laterality Date  . Foot surgery      reconstruction x's 4 after work accident    Family History:  Family History  Problem Relation Age of Onset  . Cancer Mother     throat & lung  . Coronary artery disease Father   . Hypertension Father   . Cancer Sister   . Alcohol abuse Brother     Social History:  reports that he has quit smoking. He has never used smokeless tobacco. He reports that he does not drink alcohol or use illicit drugs.  Additional Social History:  Alcohol / Drug Use Pain Medications: See MAR Prescriptions: See MAR Over the Counter: See MAR History of alcohol / drug use?: No history of alcohol / drug abuse  CIWA: CIWA-Ar BP: 103/62 mmHg Pulse Rate: 109 COWS:    PATIENT STRENGTHS: (choose at least two) Ability for insight Supportive family/friends  Allergies: No Known Allergies  Home Medications:  (Not in a hospital admission)  OB/GYN Status:  No LMP for male patient.  General Assessment Data Location of Assessment: [facility] ED TTS Assessment: In system Is this a Tele or Face-to-Face Assessment?: Tele Assessment Is this an Initial Assessment or a Re-assessment for this encounter?: Initial Assessment Marital status: Married [place] name: na Is patient pregnant?: No Pregnancy Status: No Living Arrangements: Spouse/significant other Can pt return to current living arrangement?: Yes Admission Status: Voluntary Is patient capable of signing voluntary admission?: Yes Referral Source: Self/Family/Friend Insurance type: Medicaid  Medical Screening Exam ([place]) Medical Exam completed: Yes  Crisis Care Plan Living Arrangements: Spouse/significant  other Legal Guardian: Other: Name of Psychiatrist: na Name of Therapist: na  Education Status Is patient currently in school?: No Current Grade: na Highest grade of school patient has completed: 12 Name of school: na Contact person: na  Risk to self with the past 6 months Suicidal Ideation: No Has patient been a risk to self within the past 6 months prior to admission? : No Suicidal Intent: No Has patient had any suicidal intent within the past 6 months prior to admission? : No Is patient at risk for suicide?: No Suicidal Plan?: No Has patient had any suicidal plan within the past 6 months prior to admission? : No Access to Means: Yes (Pt. admitts to having firearms locked,keys were given to son) Specify Access to Suicidal Means: Firearms, but they are locked in cabinet, keys given to son  What has been your use of drugs/alcohol within the last 12 months?: na Previous Attempts/Gestures: No How many times?: 0 Other Self Harm Risks: none Triggers for Past Attempts:  (none) Intentional Self Injurious Behavior: None Family Suicide History: No Recent stressful life event(s): Other (Comment) (Health issues) Persecutory voices/beliefs?: No Depression: No Depression Symptoms: Feeling angry/irritable Substance abuse history and/or treatment for substance abuse?: No Suicide prevention information given to non-admitted patients: Yes (Social worker contacted to render information)  Risk to Others within the past 6 months Homicidal Ideation: No Does patient have any lifetime risk of violence toward others beyond the six months prior to admission? : No Thoughts of Harm to Others: No Current Homicidal Intent: No Current Homicidal Plan: No Access to Homicidal Means: No Identified Victim: na History of harm to others?: No Assessment of Violence: None Noted Violent Behavior Description: na Does patient have access to weapons?: Yes (Comment) (Pt. has firearm but no access, son currently  has keys) Criminal Charges Pending?: No Does patient have a court date: No Is patient on probation?: No  Psychosis Hallucinations: None noted Delusions: None noted  Mental Status Report Appearance/Hygiene: In scrubs Eye Contact: Fair Motor Activity: Unremarkable Speech: Unremarkable Level of Consciousness: Alert Mood: Pleasant Affect: Appropriate to circumstance Anxiety Level: Minimal Thought Processes: Coherent, Relevant Judgement: Unimpaired Orientation: Person, Place, Time, Situation Obsessive Compulsive Thoughts/Behaviors: None  Cognitive Functioning Concentration: Normal Memory: Recent Intact, Remote Intact IQ: Average Insight: Good Impulse Control: Good Appetite: Fair Weight Loss: 0 Weight Gain: 0 Sleep: No Change Total Hours of Sleep: 6 Vegetative Symptoms: None  ADLScreening [facility] Assessment Services) Patient's cognitive ability adequate to safely complete daily activities?: Yes Patient able to express need for assistance with ADLs?: Yes Independently performs ADLs?: Yes (appropriate for developmental age)  Prior Inpatient Therapy Prior Inpatient Therapy: No Prior Therapy Dates:  na Prior Therapy Facilty/Provider(s): na Reason for Treatment: na  Prior Outpatient Therapy Prior Outpatient Therapy: No Prior Therapy Dates: na Prior Therapy Facilty/Provider(s): na Reason for Treatment: na Does patient have an ACCT team?: No Does patient have Intensive In-House Services?  : No Does patient have [facility] services? : No Does patient have P4CC services?: No  ADL Screening (condition at time of admission) Patient's cognitive ability adequate to safely complete daily activities?: Yes Is the patient deaf or have difficulty hearing?: No Does the patient have difficulty seeing, even when wearing glasses/contacts?: No Does the patient have difficulty concentrating, remembering, or making decisions?: No Patient able to express need for assistance with ADLs?:  Yes Does the patient have difficulty dressing or bathing?: No Independently performs ADLs?: Yes (appropriate for developmental age) Does the patient have difficulty walking or climbing stairs?: No Weakness of Legs: None Weakness of Arms/Hands: None  Home Assistive Devices/Equipment Home Assistive Devices/Equipment: None  Therapy Consults (therapy consults require a physician order) PT Evaluation Needed: No OT Evalulation Needed: No SLP Evaluation Needed: No Abuse/Neglect Assessment (Assessment to be complete while patient is alone) Physical Abuse: Denies Verbal Abuse: Denies Sexual Abuse: Denies Exploitation of patient/patient's resources: Denies Self-Neglect: Denies Values / Beliefs Cultural Requests During Hospitalization: None Spiritual Requests During Hospitalization: None Consults Spiritual Care Consult Needed: No Social Work Consult Needed: No Regulatory affairs officer (For Healthcare) Does patient have an advance directive?: No Would patient like information on creating an advanced directive?: No - patient declined information    Additional Information 1:1 In Past 12 Months?: No CIRT Risk: No Elopement Risk: No Does patient have medical clearance?: Yes     Disposition: This Probation officer contacted Jack [name] M.D. To inform of disposition. This Probation officer will also contact the MSW at [facility] to possibly give patient some information on outpatient resources and stress reduction. Patient does not meet criteria for inpatient admission this date.                  Disposition Initial Assessment Completed for this Encounter: Yes Disposition of Patient: Other dispositions Other disposition(s): Information only  Mamie [name] 04/12/2015 2:28 PM

## 2015-04-12 NOTE — ED Notes (Signed)
Pt recently d/c for SOB two days ago.  Pt reports he was lying in bed this morning when he became suddenly SOB.  Pt took home neb tx  With no relief.  Pt given additional tx with EMS and 125mg  Solu-medrol.  Pt is A&Ox4 upon arrival.

## 2015-04-12 NOTE — ED Notes (Signed)
Patient has returned from being out of the department; placed patient back on monitor, continuous pulse oximetry and blood pressure cuff 

## 2015-04-16 ENCOUNTER — Encounter (HOSPITAL_COMMUNITY): Payer: Self-pay | Admitting: Emergency Medicine

## 2015-04-16 ENCOUNTER — Emergency Department (HOSPITAL_COMMUNITY)
Admission: EM | Admit: 2015-04-16 | Discharge: 2015-04-16 | Disposition: A | Discharge status: Home / Self Care | Payer: Commercial Managed Care - HMO | Attending: Emergency Medicine | Admitting: Emergency Medicine

## 2015-04-16 ENCOUNTER — Emergency Department (HOSPITAL_COMMUNITY): Payer: Commercial Managed Care - HMO

## 2015-04-16 DIAGNOSIS — E785 Hyperlipidemia, unspecified: Secondary | ICD-10-CM | POA: Diagnosis not present

## 2015-04-16 DIAGNOSIS — J441 Chronic obstructive pulmonary disease with (acute) exacerbation: Secondary | ICD-10-CM | POA: Diagnosis not present

## 2015-04-16 DIAGNOSIS — R0602 Shortness of breath: Secondary | ICD-10-CM | POA: Diagnosis present

## 2015-04-16 DIAGNOSIS — Z79899 Other long term (current) drug therapy: Secondary | ICD-10-CM | POA: Insufficient documentation

## 2015-04-16 DIAGNOSIS — Z8739 Personal history of other diseases of the musculoskeletal system and connective tissue: Secondary | ICD-10-CM | POA: Diagnosis not present

## 2015-04-16 DIAGNOSIS — Z8619 Personal history of other infectious and parasitic diseases: Secondary | ICD-10-CM | POA: Insufficient documentation

## 2015-04-16 DIAGNOSIS — I10 Essential (primary) hypertension: Secondary | ICD-10-CM | POA: Insufficient documentation

## 2015-04-16 DIAGNOSIS — Z87891 Personal history of nicotine dependence: Secondary | ICD-10-CM | POA: Insufficient documentation

## 2015-04-16 DIAGNOSIS — F419 Anxiety disorder, unspecified: Secondary | ICD-10-CM

## 2015-04-16 DIAGNOSIS — G8929 Other chronic pain: Secondary | ICD-10-CM | POA: Diagnosis not present

## 2015-04-16 DIAGNOSIS — Z8719 Personal history of other diseases of the digestive system: Secondary | ICD-10-CM | POA: Insufficient documentation

## 2015-04-16 HISTORY — DX: Chronic obstructive pulmonary disease, unspecified: J44.9

## 2015-04-16 MED ORDER — ALPRAZOLAM 0.25 MG PO TABS: 0.2500 mg | ORAL_TABLET | Freq: Two times a day (BID) | ORAL | Status: DC | PRN

## 2015-04-16 MED ORDER — LORAZEPAM 0.5 MG PO TABS
0.5000 mg | ORAL_TABLET | Freq: Once | ORAL | Status: AC
  Administered 2015-04-16: 0.5 mg via ORAL
  Filled 2015-04-16: qty 1

## 2015-04-16 NOTE — ED Notes (Signed)
Pt states that he is breathing "about 25% better" since taking the Ativan PO

## 2015-04-16 NOTE — ED Provider Notes (Signed)
CSN: [id number]     Arrival date & time 04/16/15  1709 History   First MD Initiated Contact with Patient 04/16/15 1803     Chief Complaint  Patient presents with  . Shortness of Breath     (Consider location/radiation/quality/duration/timing/severity/associated sxs/prior Treatment) HPI Comments: 69 year old male with past medical history including hypertension, hyperlipidemia, COPD who presents with shortness of breath. The patient has had several recent presentations for shortness of breath including a several day hospitalization this month for workup. He has had a negative stress test, negative CTA to rule out PE, and reassuring workup from cardiology. He came back for shortness of breath on 12/16 and was treated for COPD exacerbation. Patient states he has been taking prednisone and his last dose is tomorrow. He reports that this morning after breakfast he started feeling tightness in his abdomen and then became short of breath. He later had another episode of shortness of breath around 4 PM which is why he called EMS. He denies any associated chest pain. He received albuterol from EMS which he reports gave him mild improvement. He does endorse some anxiety related to the shortness of breath. He denies any fevers, vomiting, diarrhea, or any new symptoms. This is the same shortness of breath for which she had been admitted earlier this month.  Patient is a 69 y.o. male presenting with shortness of breath. The history is provided by the patient.  Shortness of Breath   Past Medical History  Diagnosis Date  . ALLERGIC RHINITIS 02/16/2007  . BACK PAIN 02/16/2007  . FOOT PAIN, CHRONIC 02/27/2008  . 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 HYPERLIPIDEMIA 02/16/2007  . HYPERTENSION 02/16/2007  . PROSTATE SPECIFIC ANTIGEN, ELEVATED 02/16/2007  . 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 Diverticulosis 09/27/2013  . Shortness of breath dyspnea   . COPD (chronic obstructive pulmonary disease) [facility])    Past Surgical  History  Procedure Laterality Date  . Foot surgery      reconstruction x's 4 after work accident   Family History  Problem Relation Age of Onset  . Cancer Mother     throat & lung  . Coronary artery disease Father   . Hypertension Father   . Cancer Sister   . Alcohol abuse Brother    Social History  Substance Use Topics  . Smoking status: Former Research scientist (life sciences)  . Smokeless tobacco: Never Used     Comment: quit smoking [id number]  . Alcohol Use: No     Comment: no    Review of Systems  Respiratory: Positive for shortness of breath.    10 Systems reviewed and are negative for acute change except as noted in the HPI.    Allergies  Review of patient's allergies indicates no known allergies.  Home Medications   Prior to Admission medications   Medication Sig Start Date End Date Taking? Authorizing Provider  albuterol (PROVENTIL HFA;VENTOLIN HFA) 108 (90 BASE) MCG/ACT inhaler Inhale 2 puffs into the lungs every 6 (six) hours as needed for wheezing or shortness of breath. 04/02/15  Yes Biagio Borg, MD  albuterol (PROVENTIL) (2.5 MG/3ML) 0.083% nebulizer solution Take 3 mLs (2.5 mg total) by nebulization every 6 (six) hours as needed for wheezing. 04/11/15  Yes Costin Karlyne Greenspan, MD  atenolol (TENORMIN) 25 MG tablet Take 1 tablet (25 mg total) by mouth daily. 06/26/14  Yes Philemon Kingdom, MD  cetirizine (ZYRTEC) 10 MG tablet Take 1 tablet (10 mg total) by mouth daily. 03/26/15  Yes Aaron Edelman  Sabra Heck, MD  finasteride (PROSCAR) 5 MG tablet Take 1 tablet (5 mg total) by mouth daily. 09/27/13  Yes Biagio Borg, MD  ipratropium (ATROVENT) 0.02 % nebulizer solution Take 2.5 mLs (0.5 mg total) by nebulization 3 (three) times daily. 04/11/15  Yes Costin Karlyne Greenspan, MD  lisinopril-hydrochlorothiazide (PRINZIDE,ZESTORETIC) 20-12.5 MG per tablet Take 1 tablet by mouth daily. 10/04/14  Yes Biagio Borg, MD  simvastatin (ZOCOR) 40 MG tablet Take 1 tablet (40 mg total) by mouth daily at 6 PM. Patient taking  differently: Take 40 mg by mouth every morning.  10/18/14  Yes Biagio Borg, MD  ALPRAZolam Duanne Moron) 0.25 MG tablet Take 1 tablet (0.25 mg total) by mouth 2 (two) times daily as needed for anxiety. 04/16/15   Wenda Overland Aziah Brostrom, MD   BP 111/71 mmHg  Pulse 82  Temp(Src) 98.4 F (36.9 C) (Oral)  Resp 29  SpO2 97% Physical Exam  Constitutional: He is oriented to person, place, and time. He appears well-developed and well-nourished. No distress.  HENT:  Head: Normocephalic and atraumatic.  Moist mucous membranes  Eyes: Conjunctivae are normal. Pupils are equal, round, and reactive to light.  Neck: Neck supple.  Cardiovascular: Normal rate, regular rhythm and normal heart sounds.   No murmur heard. Pulmonary/Chest: Breath sounds normal.  very mild dyspnea with no wheezing or abnormal breath sounds  Abdominal: Soft. Bowel sounds are normal. He exhibits no distension. There is no tenderness.  Musculoskeletal: He exhibits no edema.  Neurological: He is alert and oriented to person, place, and time.  Fluent speech  Skin: Skin is warm and dry.  Psychiatric: He has a normal mood and affect. Judgment normal.  Nursing note and vitals reviewed.   ED Course  Procedures (including critical care time) Labs Review Labs Reviewed - No data to display  Imaging Review Dg Chest 2 View  04/16/2015  CLINICAL DATA:  Worsening shortness of breath today. EXAM: CHEST  2 VIEW COMPARISON:  04/12/2015. FINDINGS: The cardiac silhouette, mediastinal and hilar contours are within normal limits. There is mild tortuosity of the thoracic aorta. The lungs are clear. Stable linear scarring changes at both lung bases. No pleural effusion or pneumothorax. The bony thorax is intact. IMPRESSION: No acute cardiopulmonary findings. Electronically Signed   By: Marijo Sanes M.D.   On: 04/16/2015 18:24     EKG Interpretation   Date/Time:  Tuesday April 16 2015 17:16:47 EST Ventricular Rate:  96 PR Interval:   139 QRS Duration: 90 QT Interval:  339 QTC Calculation: 428 R Axis:   90 Text Interpretation:  Sinus rhythm Right atrial enlargement Borderline  right axis deviation Consider left ventricular hypertrophy No significant  change since last tracing Confirmed by Christon Parada MD, Mazin Emma [phone]) on  04/16/2015 6:26:17 PM     Medications  LORazepam (ATIVAN) tablet 0.5 mg (0.5 mg Oral Given 04/16/15 1918)    MDM   Final diagnoses:  Shortness of breath  Anxiety   patient presents with another episode of shortness of breath in the setting of recent extensive cardiac workup for his same symptoms. On arrival, the patient was well-appearing with reassuring vital signs. O2 sat normal on room air. No wheezing on auscultation. I had a hard time getting the patient to slow his breathing down enough to take a full deep inspiration but I heard no abnormal lung sounds. EKG unchanged from last tracing. Chest x-ray unremarkable. I reviewed his chart which shows no evidence of PE, reassuring cardiac stress test, no evidence  of heart failure, and reassuring evaluation by cardiology. As the patient is already on steroids and is not wheezing currently, I do not feel he needs any further COPD treatment. I discussed with him the possibility that anxiety is playing a role in his symptoms. Gave him by mouth Ativan and stated that he had a mild improvement in his breathing. He has been given Atarax in the past by his PCP for anxiety and states that he notices no difference when taking it. I provided him with a small amount of Xanax but cautioned on use and side effects. I instructed to use his Xanax and discontinue Atarax for the time being until he follows up with his PCP. Return precautions reviewed. Patient discharged in satisfactory condition.    Sharlett Iles, MD 04/17/15 [phone]

## 2015-04-16 NOTE — ED Notes (Signed)
Pt is from home.  Pt was admitted to [facility] last week for 2 days.  He was told he has COPD.  Pt was outside today and ShOB just got worse.  He used his inhaler and 2 combo nebs.  EMS gave 5mg  albuterol neb.  O2 on RA 97%

## 2015-04-16 NOTE — ED Notes (Signed)
Bed: [id number] Expected date:  Expected time:  Means of arrival:  Comments: EMS- 69yo M, SOB/Tx w/o relief

## 2015-04-17 ENCOUNTER — Telehealth: Payer: Self-pay | Admitting: Internal Medicine

## 2015-04-17 ENCOUNTER — Ambulatory Visit (INDEPENDENT_AMBULATORY_CARE_PROVIDER_SITE_OTHER): Payer: Commercial Managed Care - HMO | Admitting: Internal Medicine

## 2015-04-17 VITALS — BP 106/64 | HR 70 | Temp 98.9°F | Ht 65.0 in | Wt 163.0 lb

## 2015-04-17 DIAGNOSIS — I10 Essential (primary) hypertension: Secondary | ICD-10-CM | POA: Diagnosis not present

## 2015-04-17 DIAGNOSIS — R109 Unspecified abdominal pain: Secondary | ICD-10-CM | POA: Insufficient documentation

## 2015-04-17 DIAGNOSIS — F418 Other specified anxiety disorders: Secondary | ICD-10-CM | POA: Diagnosis not present

## 2015-04-17 DIAGNOSIS — R0602 Shortness of breath: Secondary | ICD-10-CM | POA: Diagnosis not present

## 2015-04-17 DIAGNOSIS — F419 Anxiety disorder, unspecified: Secondary | ICD-10-CM | POA: Insufficient documentation

## 2015-04-17 DIAGNOSIS — F329 Major depressive disorder, single episode, unspecified: Secondary | ICD-10-CM | POA: Insufficient documentation

## 2015-04-17 MED ORDER — ALPRAZOLAM 0.25 MG PO TABS: 0.2500 mg | ORAL_TABLET | Freq: Two times a day (BID) | ORAL | Status: DC | PRN

## 2015-04-17 MED ORDER — SERTRALINE HCL 100 MG PO TABS: 100.0000 mg | ORAL_TABLET | Freq: Every day | ORAL | Status: DC

## 2015-04-17 MED ORDER — PANTOPRAZOLE SODIUM 40 MG PO TBEC: 40.0000 mg | DELAYED_RELEASE_TABLET | Freq: Every day | ORAL | Status: DC

## 2015-04-17 NOTE — Telephone Encounter (Signed)
Pt has appt scheduled 04/17/2015

## 2015-04-17 NOTE — Assessment & Plan Note (Addendum)
Suspect anxiety related as per ED eval, though does also have elements of restrictive lung dz, and ? Copd, to finish prednisone, cont inhalers prn  Note:  Total time for pt hx, exam, review of record with pt in the room, determination of diagnoses and plan for further eval and tx is > 40 min, with over 50% spent in coordination and counseling of patient

## 2015-04-17 NOTE — Progress Notes (Signed)
Subjective:    Patient ID: Jack [name], male    DOB: 1945-06-19, 69 y.o.   MRN: [id number]  HPI  Here after seen in ED with dyspnea, felt likely related to anxiety, with some improvement with the prn xanax since then.  Admits to mild to mod worsening depressive symptoms, with "talking out of my head" a few days ago, but no suicidal ideation, or panic; has ongoing anxiety as well.  States abd pains since ED have made his sob worse, but xanax helped prior to coming to this appt today  Prior vistaril not helping recently  Also c/o mid abd pain, dull, without radiation, without n/v and Denies worsening reflux,  dysphagia, n/v, or blood, but has had intermittent constipatoin as well.  Recent lipase neg in ED.  Recent CT abd /pelvic and abd u/s neg for acute. Has not tried any OTC meds such as prilosec or tums Pt denies chest pain, increased sob or doe, wheezing, orthopnea, PND, increased LE swelling, palpitations, dizziness or syncope, except for the above.  Pt denies new neurological symptoms such as new headache, or facial or extremity weakness or numbness  Past Medical History  Diagnosis Date  . ALLERGIC RHINITIS 02/16/2007  . BACK PAIN 02/16/2007  . FOOT PAIN, CHRONIC 02/27/2008  . 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 HYPERLIPIDEMIA 02/16/2007  . HYPERTENSION 02/16/2007  . PROSTATE SPECIFIC ANTIGEN, ELEVATED 02/16/2007  . 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 Diverticulosis 09/27/2013  . Shortness of breath dyspnea   . COPD (chronic obstructive pulmonary disease) [facility])    Past Surgical History  Procedure Laterality Date  . Foot surgery      reconstruction x's 4 after work accident    reports that he has quit smoking. He has never used smokeless tobacco. He reports that he does not drink alcohol or use illicit drugs. family history includes Alcohol abuse in his brother; Cancer in his mother and sister; Coronary artery disease in his father; Hypertension in his father. No Known Allergies Current  Outpatient Prescriptions on File Prior to Visit  Medication Sig Dispense Refill  . albuterol (PROVENTIL HFA;VENTOLIN HFA) 108 (90 BASE) MCG/ACT inhaler Inhale 2 puffs into the lungs every 6 (six) hours as needed for wheezing or shortness of breath. 1 Inhaler 11  . albuterol (PROVENTIL) (2.5 MG/3ML) 0.083% nebulizer solution Take 3 mLs (2.5 mg total) by nebulization every 6 (six) hours as needed for wheezing. 75 mL 12  . atenolol (TENORMIN) 25 MG tablet Take 1 tablet (25 mg total) by mouth daily. 90 tablet 3  . cetirizine (ZYRTEC) 10 MG tablet Take 1 tablet (10 mg total) by mouth daily. 30 tablet 11  . finasteride (PROSCAR) 5 MG tablet Take 1 tablet (5 mg total) by mouth daily. 30 tablet 11  . ipratropium (ATROVENT) 0.02 % nebulizer solution Take 2.5 mLs (0.5 mg total) by nebulization 3 (three) times daily. 75 mL 12  . lisinopril-hydrochlorothiazide (PRINZIDE,ZESTORETIC) 20-12.5 MG per tablet Take 1 tablet by mouth daily. 30 tablet 11  . simvastatin (ZOCOR) 40 MG tablet Take 1 tablet (40 mg total) by mouth daily at 6 PM. (Patient taking differently: Take 40 mg by mouth every morning. ) 90 tablet 3   No current facility-administered medications on file prior to visit.   Review of Systems  Constitutional: Negative for unusual diaphoresis or night sweats HENT: Negative for ringing in ear or discharge Eyes: Negative for double vision or worsening visual disturbance.  Respiratory: Negative for choking  and stridor.   Gastrointestinal: Negative for vomiting or other signifcant bowel change Genitourinary: Negative for hematuria or change in urine volume.  Musculoskeletal: Negative for other MSK pain or swelling Skin: Negative for color change and worsening wound.  Neurological: Negative for tremors and numbness other than noted  Psychiatric/Behavioral: Negative for decreased concentration or agitation other than above       Objective:   Physical Exam BP 106/64 mmHg  Pulse 70  Temp(Src) 98.9 F  (37.2 C) (Oral)  Ht 5\' 5"  (1.651 m)  Wt 163 lb (73.936 kg)  BMI 27.12 kg/m2  SpO2 98% VS noted,  Constitutional: Pt appears in no significant distress HENT: Head: NCAT.  Right Ear: External ear normal.  Left Ear: External ear normal.  Eyes: . Pupils are equal, round, and reactive to light. Conjunctivae and EOM are normal Neck: Normal range of motion. Neck supple.  Cardiovascular: Normal rate and regular rhythm.   Pulmonary/Chest: Effort normal and breath sounds without rales or wheezing.  Abd:  Soft, NT, ND, + BS Neurological: Pt is alert. Not confused , motor grossly intact Skin: Skin is warm. No rash, no LE edema Psychiatric: Pt behavior is normal. No agitation. 2+ nervous    Assessment & Plan:

## 2015-04-17 NOTE — Assessment & Plan Note (Signed)
stable overall by history and exam, recent data reviewed with pt, and pt to continue medical treatment as before,  to f/u any worsening symptoms or concerns BP Readings from Last 3 Encounters:  04/17/15 106/64  04/16/15 111/71  04/12/15 149/66

## 2015-04-17 NOTE — Assessment & Plan Note (Signed)
Mod to severe recent worsening, with ? Of psychosis associated symptoms recently, currently not exhibiting symptoms , alert and o x 3; to cont xanax prn, also add Zoloft 100 qd,  to f/u any worsening symptoms or concerns

## 2015-04-17 NOTE — Assessment & Plan Note (Addendum)
Located mid abd with some tenderness today, recent lipase and lft's, cbc ok; to add trial PPI for possible element gastritis, also refer GI - ? Need EGD; also for miralax for constipatoin

## 2015-04-17 NOTE — Progress Notes (Signed)
Pre visit review using our clinic review tool, if applicable. No additional management support is needed unless otherwise documented below in the visit note. 

## 2015-04-17 NOTE — Patient Instructions (Addendum)
.  Please take all new medication as prescribed  - the protonix 40 mg per day (for stomach acid), the xanax refill as needed, and the Zoloft (sertraline) for depression  Please take HALF of the Zoloft to start with for 1 week, then a whole pill per day after that  OK to stop the hydroxyzine (vistaril)  Please start Miralax 17 gm in water every day (OTC) for the constipation  Please continue all other medications as before, including the inhalers  Please have the pharmacy call with any other refills you may need.  Please keep your appointments with your specialists as you may have planned  You will be contacted regarding the referral for: Gastroenterology  Please return in 1 month, or sooner if needed

## 2015-04-17 NOTE — Telephone Encounter (Signed)
Pt's wife stating he is having some really bad nausea and he is unable to throw up and he is feeling really bad. He already has an appointment for tomorrow morning but she thinks he needs to see Dr. Jenny Reichmann sooner.  He's already been in hospital Can you please give his wife a call to see if there is anything they can do till he comes in tomorrow. Best number is [phone]

## 2015-04-18 ENCOUNTER — Ambulatory Visit: Payer: Commercial Managed Care - HMO | Admitting: Internal Medicine

## 2015-04-18 ENCOUNTER — Encounter: Payer: Self-pay | Admitting: Gastroenterology

## 2015-04-23 ENCOUNTER — Inpatient Hospital Stay: Payer: Commercial Managed Care - HMO | Admitting: Internal Medicine

## 2015-04-24 ENCOUNTER — Telehealth (HOSPITAL_COMMUNITY): Payer: Self-pay | Admitting: *Deleted

## 2015-04-24 NOTE — Telephone Encounter (Signed)
Patient given detailed instructions per Myocardial Perfusion Study Information Sheet for the test on 04/26/15 at 1100. Patient notified to arrive 15 minutes early and that it is imperative to arrive on time for appointment to keep from having the test rescheduled.  If you need to cancel or reschedule your appointment, please call the office within 24 hours of your appointment. Failure to do so may result in a cancellation of your appointment, and a $50 no show fee. Patient verbalized understanding.Latanga Nedrow, Ranae Palms

## 2015-04-26 ENCOUNTER — Ambulatory Visit (HOSPITAL_COMMUNITY): Payer: Commercial Managed Care - HMO

## 2015-04-30 ENCOUNTER — Ambulatory Visit: Payer: Commercial Managed Care - HMO | Admitting: Internal Medicine

## 2015-05-01 ENCOUNTER — Emergency Department (HOSPITAL_COMMUNITY)
Admission: EM | Admit: 2015-05-01 | Discharge: 2015-05-01 | Disposition: A | Discharge status: Home / Self Care | Payer: Medicare Other | Attending: Emergency Medicine | Admitting: Emergency Medicine

## 2015-05-01 ENCOUNTER — Encounter (HOSPITAL_COMMUNITY): Payer: Self-pay | Admitting: *Deleted

## 2015-05-01 ENCOUNTER — Telehealth: Payer: Self-pay | Admitting: Internal Medicine

## 2015-05-01 ENCOUNTER — Other Ambulatory Visit: Payer: Self-pay

## 2015-05-01 ENCOUNTER — Emergency Department (HOSPITAL_COMMUNITY): Payer: Medicare Other

## 2015-05-01 DIAGNOSIS — Z79899 Other long term (current) drug therapy: Secondary | ICD-10-CM | POA: Diagnosis not present

## 2015-05-01 DIAGNOSIS — I10 Essential (primary) hypertension: Secondary | ICD-10-CM | POA: Diagnosis not present

## 2015-05-01 DIAGNOSIS — J441 Chronic obstructive pulmonary disease with (acute) exacerbation: Secondary | ICD-10-CM | POA: Diagnosis not present

## 2015-05-01 DIAGNOSIS — R0600 Dyspnea, unspecified: Secondary | ICD-10-CM

## 2015-05-01 DIAGNOSIS — Z8619 Personal history of other infectious and parasitic diseases: Secondary | ICD-10-CM | POA: Diagnosis not present

## 2015-05-01 DIAGNOSIS — Z8719 Personal history of other diseases of the digestive system: Secondary | ICD-10-CM | POA: Insufficient documentation

## 2015-05-01 DIAGNOSIS — G8929 Other chronic pain: Secondary | ICD-10-CM | POA: Insufficient documentation

## 2015-05-01 DIAGNOSIS — Z87891 Personal history of nicotine dependence: Secondary | ICD-10-CM | POA: Insufficient documentation

## 2015-05-01 DIAGNOSIS — Z8739 Personal history of other diseases of the musculoskeletal system and connective tissue: Secondary | ICD-10-CM | POA: Insufficient documentation

## 2015-05-01 DIAGNOSIS — R0602 Shortness of breath: Secondary | ICD-10-CM | POA: Diagnosis not present

## 2015-05-01 DIAGNOSIS — E785 Hyperlipidemia, unspecified: Secondary | ICD-10-CM | POA: Insufficient documentation

## 2015-05-01 LAB — BASIC METABOLIC PANEL
Anion gap: 11 (ref 5–15)
BUN: 28 mg/dL — AB (ref 6–20)
CALCIUM: 9.4 mg/dL (ref 8.9–10.3)
CO2: 24 mmol/L (ref 22–32)
CREATININE: 1.11 mg/dL (ref 0.61–1.24)
Chloride: 103 mmol/L (ref 101–111)
GFR calc non Af Amer: >60 mL/min (ref >60)
Glucose, Bld: 122 mg/dL — ABNORMAL HIGH (ref 65–99)
Potassium: 3.5 mmol/L (ref 3.5–5.1)
SODIUM: 138 mmol/L (ref 135–145)

## 2015-05-01 LAB — CBC WITH DIFFERENTIAL/PLATELET
BASOS PCT: 0 %
Basophils Absolute: 0 10*3/uL (ref 0.0–0.1)
EOS ABS: 0 10*3/uL (ref 0.0–0.7)
EOS PCT: 0 %
HCT: 38.5 % — ABNORMAL LOW (ref 39.0–52.0)
Hemoglobin: 13.1 g/dL (ref 13.0–17.0)
Lymphocytes Relative: 15 %
Lymphs Abs: 1.5 10*3/uL (ref 0.7–4.0)
MCH: 32.4 pg (ref 26.0–34.0)
MCHC: 34 g/dL (ref 30.0–36.0)
MCV: 95.3 fL (ref 78.0–100.0)
MONO ABS: 0.6 10*3/uL (ref 0.1–1.0)
MONOS PCT: 6 %
NEUTROS PCT: 79 %
Neutro Abs: 7.5 10*3/uL (ref 1.7–7.7)
PLATELETS: 187 10*3/uL (ref 150–400)
RBC: 4.04 MIL/uL — ABNORMAL LOW (ref 4.22–5.81)
RDW: 12.5 % (ref 11.5–15.5)
WBC: 9.6 10*3/uL (ref 4.0–10.5)

## 2015-05-01 MED ORDER — ALBUTEROL SULFATE (2.5 MG/3ML) 0.083% IN NEBU
5.0000 mg | INHALATION_SOLUTION | Freq: Once | RESPIRATORY_TRACT | Status: AC
  Administered 2015-05-01: 5 mg via RESPIRATORY_TRACT
  Filled 2015-05-01: qty 6

## 2015-05-01 MED ORDER — LORAZEPAM 2 MG/ML IJ SOLN
1.0000 mg | Freq: Once | INTRAMUSCULAR | Status: AC
  Administered 2015-05-01: 1 mg via INTRAVENOUS
  Filled 2015-05-01: qty 1

## 2015-05-01 MED ORDER — IPRATROPIUM BROMIDE 0.02 % IN SOLN
0.5000 mg | Freq: Once | RESPIRATORY_TRACT | Status: AC
  Administered 2015-05-01: 0.5 mg via RESPIRATORY_TRACT
  Filled 2015-05-01: qty 2.5

## 2015-05-01 MED ORDER — PREDNISONE 20 MG PO TABS
60.0000 mg | ORAL_TABLET | Freq: Once | ORAL | Status: AC
  Administered 2015-05-01: 60 mg via ORAL
  Filled 2015-05-01: qty 3

## 2015-05-01 NOTE — ED Provider Notes (Signed)
CSN: [id number]     Arrival date & time 05/01/15  0809 History   First MD Initiated Contact with Patient 05/01/15 [phone]     Chief Complaint  Patient presents with  . Shortness of Breath     (Consider location/radiation/quality/duration/timing/severity/associated sxs/prior Treatment) HPI Comments: Patient with h/o COPD, multiple presentations for SOB, hospitalization 03/2015 with 2D echo which showed EF 65-70% with grade 1 diastolic dysfunction, underwent a stress test which was deemed to be normal, PFTs which showed evidence of restrictive lung disease with response to bronchodilators, CT angio scan of the chest prior to admission without evidence of PE and without significant lung pathology -- presents with complaint of shortness of breath. Patient is chronically short of breath however symptoms became worse early this morning approximate 4 AM. Patient woke his wife stating that he was short of breath. He used albuterol nebulizer treatment which did not help. He then called EMS for transport to the hospital. Patient denies chest pain, fever, significant change in cough. No lower extremity swelling. The onset of this condition was acute. The course is constant. Aggravating factors: none. Alleviating factors: none.    Patient is a 70 y.o. male presenting with shortness of breath. The history is provided by the patient and medical records.  Shortness of Breath Associated symptoms: no abdominal pain, no chest pain, no cough, no fever, no headaches, no rash, no sore throat and no vomiting     Past Medical History  Diagnosis Date  . ALLERGIC RHINITIS 02/16/2007  . BACK PAIN 02/16/2007  . FOOT PAIN, CHRONIC 02/27/2008  . 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 HYPERLIPIDEMIA 02/16/2007  . HYPERTENSION 02/16/2007  . PROSTATE SPECIFIC ANTIGEN, ELEVATED 02/16/2007  . 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 Diverticulosis 09/27/2013  . Shortness of breath dyspnea   . COPD (chronic obstructive pulmonary disease) [facility])     Past Surgical History  Procedure Laterality Date  . Foot surgery      reconstruction x's 4 after work accident   Family History  Problem Relation Age of Onset  . Cancer Mother     throat & lung  . Coronary artery disease Father   . Hypertension Father   . Cancer Sister   . Alcohol abuse Brother    Social History  Substance Use Topics  . Smoking status: Former Research scientist (life sciences)  . Smokeless tobacco: Never Used     Comment: quit smoking [id number]  . Alcohol Use: No     Comment: no    Review of Systems  Constitutional: Negative for fever.  HENT: Negative for rhinorrhea and sore throat.   Eyes: Negative for redness.  Respiratory: Positive for shortness of breath. Negative for cough.   Cardiovascular: Negative for chest pain.  Gastrointestinal: Negative for nausea, vomiting, abdominal pain and diarrhea.  Genitourinary: Negative for dysuria.  Musculoskeletal: Negative for myalgias.  Skin: Negative for rash.  Neurological: Negative for headaches.      Allergies  Review of patient's allergies indicates no known allergies.  Home Medications   Prior to Admission medications   Medication Sig Start Date End Date Taking? Authorizing Provider  albuterol (PROVENTIL HFA;VENTOLIN HFA) 108 (90 BASE) MCG/ACT inhaler Inhale 2 puffs into the lungs every 6 (six) hours as needed for wheezing or shortness of breath. 04/02/15   Biagio Borg, MD  albuterol (PROVENTIL) (2.5 MG/3ML) 0.083% nebulizer solution Take 3 mLs (2.5 mg total) by nebulization every 6 (six) hours as needed for wheezing. 04/11/15   Costin M  Cruzita Lederer, MD  ALPRAZolam Duanne Moron) 0.25 MG tablet Take 1 tablet (0.25 mg total) by mouth 2 (two) times daily as needed for anxiety. 04/17/15   Biagio Borg, MD  atenolol (TENORMIN) 25 MG tablet Take 1 tablet (25 mg total) by mouth daily. 06/26/14   Philemon Kingdom, MD  cetirizine (ZYRTEC) 10 MG tablet Take 1 tablet (10 mg total) by mouth daily. 03/26/15   Noemi Chapel, MD  finasteride (PROSCAR) 5 MG  tablet Take 1 tablet (5 mg total) by mouth daily. 09/27/13   Biagio Borg, MD  ipratropium (ATROVENT) 0.02 % nebulizer solution Take 2.5 mLs (0.5 mg total) by nebulization 3 (three) times daily. 04/11/15   Costin Karlyne Greenspan, MD  lisinopril-hydrochlorothiazide (PRINZIDE,ZESTORETIC) 20-12.5 MG per tablet Take 1 tablet by mouth daily. 10/04/14   Biagio Borg, MD  pantoprazole (PROTONIX) 40 MG tablet Take 1 tablet (40 mg total) by mouth daily. 04/17/15   Biagio Borg, MD  sertraline (ZOLOFT) 100 MG tablet Take 1 tablet (100 mg total) by mouth daily. 04/17/15   Biagio Borg, MD  simvastatin (ZOCOR) 40 MG tablet Take 1 tablet (40 mg total) by mouth daily at 6 PM. Patient taking differently: Take 40 mg by mouth every morning.  10/18/14   Biagio Borg, MD   BP 122/76 mmHg  Pulse 113  Temp(Src) 98 F (36.7 C) (Oral)  Resp 29  SpO2 98% Physical Exam  Constitutional: He appears well-developed and well-nourished.  HENT:  Head: Normocephalic and atraumatic.  Mouth/Throat: Oropharynx is clear and moist.  Eyes: Conjunctivae are normal. Right eye exhibits no discharge. Left eye exhibits no discharge.  Neck: Normal range of motion. Neck supple.  Cardiovascular: Normal rate, regular rhythm and normal heart sounds.   Pulmonary/Chest: Effort normal. No respiratory distress. He has decreased breath sounds (all fields). He has no wheezes. He has no rales.  Patient taking shallow breaths with poor expiration.  Abdominal: Soft. There is no tenderness. There is no rebound and no guarding.  Neurological: He is alert.  Skin: Skin is warm and dry.  Psychiatric: He has a normal mood and affect.  Nursing note and vitals reviewed.   ED Course  Procedures (including critical care time) Labs Review Labs Reviewed  CBC WITH DIFFERENTIAL/PLATELET - Abnormal; Notable for the following:    RBC 4.04 (*)    HCT 38.5 (*)    All other components within normal limits  BASIC METABOLIC PANEL - Abnormal; Notable for the  following:    Glucose, Bld 122 (*)    BUN 28 (*)    All other components within normal limits    Imaging Review Dg Chest 2 View  05/01/2015  CLINICAL DATA:  Shortness of breath EXAM: CHEST  2 VIEW COMPARISON:  04/16/2015 FINDINGS: Borderline hyperinflation in this patient with history of COPD. There is no edema, consolidation, effusion, or pneumothorax. Normal heart size and mediastinal contours. IMPRESSION: No active cardiopulmonary disease. Electronically Signed   By: Monte Fantasia M.D.   On: 05/01/2015 08:45   I have personally reviewed and evaluated these images and lab results as part of my medical decision-making.  ED ECG REPORT   Date: 05/01/2015  Rate: 106  Rhythm: sinus tachycardia  QRS Axis: normal  Intervals: normal  ST/T Wave abnormalities: normal  Conduction Disutrbances:none  Narrative Interpretation:   Old EKG Reviewed: unchanged, bu faster  I have personally reviewed the EKG tracing and agree with the computerized printout as noted.   9:00 AM Patient seen  and examined. Work-up initiated. Medications ordered.   Vital signs reviewed and are as follows: BP 122/76 mmHg  Pulse 113  Temp(Src) 98 F (36.7 C) (Oral)  Resp 29  SpO2 98%  12:44 PM Patient discussed with and seen by Dr. Venora Maples earlier. Work-up unrevealing.   Patient was given Ativan. He feels better. He appears much more comfortable. Heart rate improved to low 100s. Patient now states that he seems to have much more trouble breathing after eating. He has upcoming EGD.  Patient encouraged to return with worsening symptoms or other concerns. He states that he will call his primary care physician for further evaluation.  MDM   Final diagnoses:  Dyspnea   Patient presents with shortness of breath. Recent hospitalization with workup ongoing. Chest x-ray today is clear. EKG is nonischemic. Labs are unremarkable. Patient treated in emergency department with albuterol, prednisone, Ativan. He seemed to get  the most relief from Ativan. Doubt PE given previous workup, no chest pain or other clinical features. Discharged home with PCP/specialist follow-up.  Carlisle Cater, PA-C 05/01/15 [place], MD 05/02/15 [phone]

## 2015-05-01 NOTE — Discharge Instructions (Signed)
Please read and follow all provided instructions.  Your diagnoses today include:  1. Dyspnea     Tests performed today include:  Chest x-ray - no pneumonia  Blood counts and electrolytes  Vital signs. See below for your results today.   Medications prescribed:   None  Take any prescribed medications only as directed.  Home care instructions:  Follow any educational materials contained in this packet.  BE VERY CAREFUL not to take multiple medicines containing Tylenol (also called acetaminophen). Doing so can lead to an overdose which can damage your liver and cause liver failure and possibly death.   Follow-up instructions: Please follow-up with your primary care provider in the next 3 days for further evaluation of your symptoms.   Return instructions:   Please return to the Emergency Department if you experience worsening symptoms.   Please return if you have any other emergent concerns.  Additional Information:  Your vital signs today were: BP 121/80 mmHg   Pulse 120   Temp(Src) 97.6 F (36.4 C) (Oral)   Resp 20   SpO2 97% If your blood pressure (BP) was elevated above 135/85 this visit, please have this repeated by your doctor within one month. --------------

## 2015-05-01 NOTE — Telephone Encounter (Signed)
Patient's daughter advised that no earlier appts at this time.  He is added to the cancellation list.

## 2015-05-01 NOTE — ED Notes (Signed)
Ambulated pt around hallway. O2 stayed at 98, Pulse fluctuated between 117-127

## 2015-05-01 NOTE — ED Notes (Signed)
Patient complains about abdominal pain that has already been addressed by his primary doctor. He has a GI appointment on the 10th. He also speaks of bladder pain, but again this is being managed by his primary doctor.

## 2015-05-01 NOTE — ED Notes (Signed)
Patient has a recent diagnosis of copd and states that he began to become short of breath last night after dinner. He did not take any of his breathing treatments at home overnight. Patient was able to ambulate to the ambulance without distress this morning. His saturation was 97% on room air on the ambulance.

## 2015-05-01 NOTE — ED Notes (Signed)
Bed: [id number] Expected date:  Expected time:  Means of arrival:  Comments: Ems- SOB

## 2015-05-01 NOTE — ED Notes (Signed)
Will update VS when pt is finished with breathing treatment

## 2015-05-01 NOTE — Telephone Encounter (Signed)
[place] states that patient is at the ED now. she states that patient had SOB this morning. patient thinks that baked potato got "stuck" in patients throat. patient has appt on 05/07/15 but is wanting to talk to a nurse regarding this.

## 2015-05-07 ENCOUNTER — Encounter: Payer: Self-pay | Admitting: Gastroenterology

## 2015-05-07 ENCOUNTER — Telehealth: Payer: Self-pay | Admitting: Internal Medicine

## 2015-05-07 ENCOUNTER — Ambulatory Visit (INDEPENDENT_AMBULATORY_CARE_PROVIDER_SITE_OTHER): Payer: Medicare Other | Admitting: Gastroenterology

## 2015-05-07 VITALS — BP 112/82 | HR 90 | Ht 64.0 in | Wt 148.0 lb

## 2015-05-07 DIAGNOSIS — K5900 Constipation, unspecified: Secondary | ICD-10-CM | POA: Diagnosis not present

## 2015-05-07 DIAGNOSIS — R1310 Dysphagia, unspecified: Secondary | ICD-10-CM | POA: Insufficient documentation

## 2015-05-07 DIAGNOSIS — R634 Abnormal weight loss: Secondary | ICD-10-CM | POA: Diagnosis not present

## 2015-05-07 DIAGNOSIS — Z86010 Personal history of colonic polyps: Secondary | ICD-10-CM | POA: Diagnosis not present

## 2015-05-07 MED ORDER — LEVOCETIRIZINE DIHYDROCHLORIDE 5 MG PO TABS: 5.0000 mg | ORAL_TABLET | Freq: Every day | ORAL | Status: DC | PRN

## 2015-05-07 NOTE — Patient Instructions (Signed)
You have been scheduled for an endoscopy and colonoscopy. Please follow the written instructions given to you at your visit today. Please pick up your prep supplies at the pharmacy within the next 1-3 days. If you use inhalers (even only as needed), please bring them with you on the day of your procedure. Your physician has requested that you go to www.[email or web address].com and enter the access code given to you at your visit today. This web site gives a general overview about your procedure. However, you should still follow specific instructions given to you by our office regarding your preparation for the procedure.   Please use Miralax daily.

## 2015-05-07 NOTE — Telephone Encounter (Signed)
Pt came by office stating he has been taking Cetirizine HCL 10 mg, about three so far, and is having shortness of breath when he takes them.  Is there anything else pt can be prescribed in its place?

## 2015-05-07 NOTE — Telephone Encounter (Signed)
Pt advised.

## 2015-05-07 NOTE — Progress Notes (Signed)
05/07/2015 SHAIL HARTUNIAN [id number] [birth year]   HISTORY OF PRESENT ILLNESS:   This is a 70 year old male who is previously known to Dr. Carlean Purl for colonoscopy in November 2008. At that time he was found to have 6 polyps that were removed and were tubular adenomas. He also had diverticulosis in the right and left colon. It was recommended that he have a repeat colonoscopy in 5 years from that time, but he did not proceed with that.  He actually presents to the office today with a few different complaints. He complains of dysphagia and food feeling like it has been getting hung up for the past couple months. Denies having any similar complaints in the past.  He also complains of a change in his voice.  His PCP recently placed him on pantoprazole 40 mg daily.  He also complains of constipation and it was recommended that he try MiraLAX, but has not been taking this on a daily basis. He describes diffuse abdominal pains that seem to come and go. He tells me that his abdomen feels "tight".  His wife also reports that he has lost a lot of weight. He tells me that he used to weigh around 200 pounds, but cannot quantify the time that he has experienced the weight loss. He had a CT scan of the abdomen and pelvis with contrast on 03/30/2015 , which was unremarkable except for distention of the bladder to the level of the umbilicus and marked prostatic enlargement. He is now on finasteride and says that he was having problems urinating previously but that has improved. He also had an abdominal ultrasound on 04/10/2015 at which times it was negative.  Just of note, he has a lot of anxiety, which he admits to. He has had multiple ER visits for complaints of shortness of breath and hospital admissions, but testing has been relatively unremarkable. He does have some COPD and is on treatment for that, but studies apparently showed to be unchanged. His PCP thinks that some of his shortness of breath is related  to his anxiety as well. He does not use oxygen, only nebulizers and inhalers.  CBC, BMP, hepatic function panel, lipase all relatively unremarkable from a GI standpoint.   Past Medical History  Diagnosis Date  . ALLERGIC RHINITIS 02/16/2007  . BACK PAIN 02/16/2007  . FOOT PAIN, CHRONIC 02/27/2008  . 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 HYPERLIPIDEMIA 02/16/2007  . HYPERTENSION 02/16/2007  . PROSTATE SPECIFIC ANTIGEN, ELEVATED 02/16/2007  . 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 Diverticulosis 09/27/2013  . Shortness of breath dyspnea   . COPD (chronic obstructive pulmonary disease) [facility])    Past Surgical History  Procedure Laterality Date  . Foot surgery      reconstruction x's 4 after work accident    reports that he has quit smoking. He has never used smokeless tobacco. He reports that he does not drink alcohol or use illicit drugs. family history includes Alcohol abuse in his brother; Cancer in his mother and sister; Coronary artery disease in his father; Hypertension in his father. No Known Allergies    Outpatient Encounter Prescriptions as of 05/07/2015  Medication Sig  . albuterol (PROVENTIL HFA;VENTOLIN HFA) 108 (90 BASE) MCG/ACT inhaler Inhale 2 puffs into the lungs every 6 (six) hours as needed for wheezing or shortness of breath.  Marland Kitchen albuterol (PROVENTIL) (2.5 MG/3ML) 0.083% nebulizer solution Take 3 mLs (2.5 mg total) by nebulization every 6 (six) hours as needed  for wheezing.  Marland Kitchen ALPRAZolam (XANAX) 0.25 MG tablet Take 1 tablet (0.25 mg total) by mouth 2 (two) times daily as needed for anxiety.  Marland Kitchen atenolol (TENORMIN) 25 MG tablet Take 1 tablet (25 mg total) by mouth daily.  . finasteride (PROSCAR) 5 MG tablet Take 1 tablet (5 mg total) by mouth daily.  Marland Kitchen ipratropium (ATROVENT) 0.02 % nebulizer solution Take 2.5 mLs (0.5 mg total) by nebulization 3 (three) times daily.  Marland Kitchen lisinopril-hydrochlorothiazide (PRINZIDE,ZESTORETIC) 20-12.5 MG per tablet Take 1 tablet by mouth daily.  .  pantoprazole (PROTONIX) 40 MG tablet Take 1 tablet (40 mg total) by mouth daily.  . sertraline (ZOLOFT) 100 MG tablet Take 1 tablet (100 mg total) by mouth daily.  . simvastatin (ZOCOR) 40 MG tablet Take 1 tablet (40 mg total) by mouth daily at 6 PM. (Patient taking differently: Take 40 mg by mouth every morning. )  . [DISCONTINUED] cetirizine (ZYRTEC) 10 MG tablet Take 1 tablet (10 mg total) by mouth daily.   No facility-administered encounter medications on file as of 05/07/2015.     REVIEW OF SYSTEMS  : All other systems reviewed and negative except where noted in the History of Present Illness.   PHYSICAL EXAM: BP 112/82 mmHg  Pulse 90  Ht 5\' 4"  (1.626 m)  Wt 148 lb (67.132 kg)  BMI 25.39 kg/m2 General: Well developed black male in no acute distress Head: Normocephalic and atraumatic Eyes:  Sclerae anicteric, conjunctiva pink. Ears: Normal auditory acuity Lungs: Clear throughout to auscultation Heart: Regular rate and rhythm Abdomen: Soft, non-distended.  Normal bowel sounds.  Mild diffuse TTP. Rectal:  Will be done at the time of colonoscopy. Musculoskeletal: Symmetrical with no gross deformities  Skin: No lesions on visible extremities Extremities: No edema  Neurological: Alert oriented x 4, grossly non-focal Psychological:  Alert and cooperative. Normal mood and affect  ASSESSMENT AND PLAN: -Personal history of colon polyps:  Adenomatous  Polyps on colonoscopy in 2008. We'll schedule for colonoscopy with Dr. Carlean Purl.  The risks, benefits, and alternatives to colonoscopy were discussed with the patient and he consents to proceed.  -Constipation:  Will begin the Miralax daily and increase to twice daily if needed. -Dysphagia:  To solid food.  Will schedule EGD with possible dilation as well.  The risks, benefits, and alternatives to EGD were discussed with the patient and he consents to proceed.  Continue pantoprazole 40 mg daily. -Weight loss:  CT scan ok.  Will do EGD and  colonoscopy.   -Abdominal pains:  Diffuse, non-specific.   CT scan and ultrasound negative. 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  CC:  Biagio Borg, MD

## 2015-05-07 NOTE — Telephone Encounter (Signed)
[place] for change to xyzal 5 mg qd prn

## 2015-05-08 NOTE — Progress Notes (Signed)
Agree with Ms. Zehr's management.  Freedom Lopezperez E. Nautia Lem, MD, FACG  

## 2015-05-09 ENCOUNTER — Ambulatory Visit: Payer: Medicare Other | Admitting: Cardiology

## 2015-05-14 ENCOUNTER — Inpatient Hospital Stay: Payer: Commercial Managed Care - HMO | Admitting: Internal Medicine

## 2015-05-14 ENCOUNTER — Encounter: Payer: Self-pay | Admitting: Internal Medicine

## 2015-05-14 ENCOUNTER — Ambulatory Visit (INDEPENDENT_AMBULATORY_CARE_PROVIDER_SITE_OTHER): Payer: Medicare Other | Admitting: Internal Medicine

## 2015-05-14 VITALS — BP 92/58 | HR 66 | Ht 66.0 in | Wt 152.4 lb

## 2015-05-14 DIAGNOSIS — I10 Essential (primary) hypertension: Secondary | ICD-10-CM

## 2015-05-14 DIAGNOSIS — R0600 Dyspnea, unspecified: Secondary | ICD-10-CM | POA: Diagnosis not present

## 2015-05-14 MED ORDER — LOSARTAN POTASSIUM-HCTZ 50-12.5 MG PO TABS: 1.0000 | ORAL_TABLET | Freq: Every day | ORAL | Status: DC

## 2015-05-14 MED ORDER — FAMOTIDINE 20 MG PO TABS
ORAL_TABLET | ORAL | Status: DC
Start: 2015-05-14 — End: 2015-11-21

## 2015-05-14 NOTE — Patient Instructions (Addendum)
Pantoprazole (protonix) 40 mg   Take  30-60 min before first meal of the day and add Pepcid ac - over the counter -  (famotidine)  20 mg one @  bedtime until return to see Dr Jenny Reichmann  Stop lisinopril  Start losartan 50-12.5 one daily > your symptoms (especially the night time strangling and breathing problems) should improve over the next several weeks and if   better at 6 weeks then return to Dr Jenny Reichmann,  if not return here.

## 2015-05-14 NOTE — Progress Notes (Signed)
Subjective:    Patient ID: Jack [name], male    DOB: 1946-01-18,    MRN: [id number]  HPI  53 yobm quit smoking in 1986 s/p admit   Admit date: 04/09/2015 Discharge date: 04/11/2015   Discharge Diagnoses:  Principal Problem:  Dyspnea on exertion Active Problems:  Hyperlipidemia  SOB (shortness of breath)  Hypertension  Restrictive lung disease  Discharge Condition: stable  Diet recommendation: heart healthy  Filed Weights   04/09/15 1904 04/10/15 0436  Weight: 75.297 kg (166 lb) 72.6 kg (160 lb 0.9 oz)    History of present illness:  See H&P, Labs, Consult and Test reports for all details in brief, patient is a 70 y.o. male with history of hypertension hyperlipidemia has been experiencing shortness of breath over the last few weeks  Hospital Course:  Patient was admitted to the hospital with dyspnea which has been progressive over the past few weeks. Cardiology was consulted and have evaluated patient while hospitalized and he underwent a 2D echo which showed EF 65-70% with grade 1 diastolic dysfunction; he also underwent a stress test which was deemed to be normal and an overall a low risk study. Patient underwent PFTs which showed evidence of restrictive lung disease with response to bronchodilators however the testing was poor as patient was unable to perform FVC, SVC and DLCO adequately. CT angio scan of the chest prior to admission without evidence of PE and without significant lung pathology other than mild linear scarring or atelectasis in the lower lobes. In discussing with the patient it appears that his dyspnea has been worsening in the winter months and he is using a kerosene indoor heater and I am concerned about CO exposure, he was instructed to avoid unsafe heating and get a CO monitor for home. His dyspnea has improved in the hospital which might also suggest home exposure. He was able to ambulate on room air without significant shortness of breath,  and maintaining oxygen saturations > 94%. Given response to bronchodilators in his PFTs, will be sent home on ipratropium and albuterol. He is having difficulties in obtaining Combivent and will go on nebulizers. His other medical problems have remained stable and no other medication changes were made. Will likely benefit from Pulmonology referral as an outpatient and repeat PFTs.  Procedures:  2D echo  Stress test  PFTs  Consultations:  Cardiology       05/14/2015 [facility] office visit/ Jack [name]  / consultation for sob on ACEi  Chief Complaint  Patient presents with  . Pulmonary Consult    Self referral. Pt c/o SOB-  esp when lying down "for a while".    has immediate sense of strangling when lies down x 3 m assoc with variable sob daytime that can be just as bad at rest as with exertion   No obvious other patterns in day to day or daytime variabilty or assoc chronic cough or cp or chest tightness, subjective wheeze overt sinus or hb symptoms. No unusual exp hx or h/o childhood pna/ asthma or knowledge of premature birth.  Sleeping ok without nocturnal  or early am exacerbation  of respiratory  c/o's or need for noct saba. Also denies any obvious fluctuation of symptoms with weather or environmental changes or other aggravating or alleviating factors except as outlined above   Current Medications, Allergies, Complete Past Medical History, Past Surgical History, Family History, and Social History were reviewed in [employer] record.  Review of Systems  Constitutional: Negative for fever, chills, activity change, appetite change and unexpected weight change.  HENT: Negative for congestion, dental problem, postnasal drip, rhinorrhea, sneezing, sore throat, trouble swallowing and voice change.   Eyes: Negative for visual disturbance.  Respiratory: Positive for shortness of breath. Negative for cough and choking.   Cardiovascular: Negative  for chest pain and leg swelling.  Gastrointestinal: Negative for nausea, vomiting and abdominal pain.  Genitourinary: Negative for difficulty urinating.  Musculoskeletal: Negative for arthralgias.  Skin: Negative for rash.  Psychiatric/Behavioral: Negative for behavioral problems and confusion.       Objective:   Physical Exam   Stoic bm nad   Wt Readings from Last 3 Encounters:  05/14/15 152 lb 6.4 oz (69.128 kg)  05/07/15 148 lb (67.132 kg)  04/17/15 163 lb (73.936 kg)    Vital signs reviewed /low bp noted  HEENT: upper dentures/ nl  turbinates, and oropharynx. Nl external ear canals without cough reflex   NECK :  without JVD/Nodes/TM/ nl carotid upstrokes bilaterally   LUNGS: no acc muscle use,  Nl contour chest which is clear to A and P bilaterally without cough on insp or exp maneuvers   CV:  RRR  no s3 or murmur or increase in P2, no edema   ABD:  soft and nontender with nl inspiratory excursion in the supine position. No bruits or organomegaly, bowel sounds nl  MS:  Nl gait/ ext warm without deformities, calf tenderness, cyanosis or clubbing No obvious joint restrictions   SKIN: warm and dry without lesions    NEURO:  alert, approp, nl sensorium with  no motor deficits    I personally reviewed images and agree with radiology impression as follows:  CT Chest   04/02/15  1. No evidence of acute pulmonary embolus. 2. 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   3. Negative lungs aside from stable mild linear scarring or atelectasis in the lower lobes.    I personally reviewed images and agree with radiology impression as follows:  CXR:  05/01/15  Borderline hyperinflation in this patient with history of COPD. There is no edema, consolidation, effusion, or pneumothorax. Normal heart size and mediastinal contours.       Assessment & Plan:

## 2015-05-15 ENCOUNTER — Encounter: Payer: Self-pay | Admitting: Internal Medicine

## 2015-05-15 NOTE — Assessment & Plan Note (Signed)
In the best review of chronic cough to date ( NEJM 2016 375 [phone]) ,  ACEi are now felt to cause cough in up to  20% of pts which is a 4 fold increase from previous reports and does not include the variety of non-specific complaints we see in pulmonary clinic in pts on ACEi but previously attributed to copd/asthma to include PNDS, throat and chest congestion, "bronchitis", unexplained dyspnea and noct "strangling" sensations(as is the case here)  as well as atypical /refractory GERD symptoms like atypical dysphagia.   The only way to prove this is not an "ACEi Case" is a trial off ACEi x a minimum of 6 weeks then regroup.   Try losartan 50-12.5 daily x 6weeks then return here if not better

## 2015-05-15 NOTE — Assessment & Plan Note (Addendum)
PFT's  04/10/15  No obstruction / mild restrictive changes  - 05/14/2015   Walked RA  2 laps @ 185 ft each stopped due to  Slow pace/ walks with cane/ no sob or desat   Dyspnea that is present at rest and lying down at night more so than it is reproduced with exertion is extremely unusual in a patient who has had an extensive negative workup and who doesn't have any evidence at all to suggest asthma, which of course can occur paroxysmally. 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 (see separate a/p)    Reflux can of course cause  overlap with the symptoms of ACE inhibitors on the upper airway and this should be treated in the short run until  Sorted out with max gerd rx/ diet then return here in 6 weeks if not better  I had an extended discussion with the patient and fm reviewing all relevant studies completed to date- both exhaustive inpatient and outpatient workups- and  lasting 35  minutes of a 60 minute visit    Each maintenance medication was reviewed in detail including most importantly the difference between maintenance and prns and under what circumstances the prns are to be triggered using an action plan format that is not reflected in the computer generated alphabetically organized AVS.    Please see instructions for details which were reviewed in writing and the patient given a copy highlighting the part that I personally wrote and discussed at today's ov.

## 2015-05-16 DIAGNOSIS — R0602 Shortness of breath: Secondary | ICD-10-CM | POA: Diagnosis not present

## 2015-05-27 ENCOUNTER — Telehealth: Payer: Self-pay | Admitting: *Deleted

## 2015-05-27 MED ORDER — MOMETASONE FUROATE 50 MCG/ACT NA SUSP
2.0000 | Freq: Every day | NASAL | Status: DC
Start: 2015-05-27 — End: 2015-08-06

## 2015-05-27 NOTE — Telephone Encounter (Signed)
Received call from pt wife stating md rx Nasacort med is not working for pt. He wakes up in the middle of the night can't breathe out of his nose. Pt is wanting msg to rx something else. MD out of office pls advise...Johny Chess

## 2015-05-27 NOTE — Telephone Encounter (Signed)
Notified pt wife with md response.../lmb 

## 2015-05-27 NOTE — Telephone Encounter (Signed)
We can try a different nasal spray to see if that works.  I am not sure what else he has tried in the past.  It will also depend on what is covered by his insurance.  I will try sending a prescription to his pharmacy, but we may need to change it depending on his insurance.

## 2015-05-28 ENCOUNTER — Encounter (HOSPITAL_COMMUNITY): Payer: Self-pay

## 2015-05-28 ENCOUNTER — Emergency Department (HOSPITAL_COMMUNITY): Payer: Medicare Other

## 2015-05-28 ENCOUNTER — Telehealth: Payer: Self-pay | Admitting: Internal Medicine

## 2015-05-28 ENCOUNTER — Emergency Department (HOSPITAL_COMMUNITY)
Admission: EM | Admit: 2015-05-28 | Discharge: 2015-05-28 | Disposition: A | Discharge status: Home / Self Care | Payer: Medicare Other | Attending: Physician Assistant | Admitting: Physician Assistant

## 2015-05-28 DIAGNOSIS — Z79899 Other long term (current) drug therapy: Secondary | ICD-10-CM | POA: Insufficient documentation

## 2015-05-28 DIAGNOSIS — R109 Unspecified abdominal pain: Secondary | ICD-10-CM | POA: Diagnosis not present

## 2015-05-28 DIAGNOSIS — Z8719 Personal history of other diseases of the digestive system: Secondary | ICD-10-CM | POA: Diagnosis not present

## 2015-05-28 DIAGNOSIS — Z8739 Personal history of other diseases of the musculoskeletal system and connective tissue: Secondary | ICD-10-CM | POA: Diagnosis not present

## 2015-05-28 DIAGNOSIS — Z8619 Personal history of other infectious and parasitic diseases: Secondary | ICD-10-CM | POA: Insufficient documentation

## 2015-05-28 DIAGNOSIS — Z7951 Long term (current) use of inhaled steroids: Secondary | ICD-10-CM | POA: Diagnosis not present

## 2015-05-28 DIAGNOSIS — G8929 Other chronic pain: Secondary | ICD-10-CM | POA: Insufficient documentation

## 2015-05-28 DIAGNOSIS — R0602 Shortness of breath: Secondary | ICD-10-CM | POA: Diagnosis not present

## 2015-05-28 DIAGNOSIS — Z87891 Personal history of nicotine dependence: Secondary | ICD-10-CM | POA: Diagnosis not present

## 2015-05-28 DIAGNOSIS — F419 Anxiety disorder, unspecified: Secondary | ICD-10-CM | POA: Insufficient documentation

## 2015-05-28 DIAGNOSIS — J441 Chronic obstructive pulmonary disease with (acute) exacerbation: Secondary | ICD-10-CM | POA: Diagnosis not present

## 2015-05-28 DIAGNOSIS — E785 Hyperlipidemia, unspecified: Secondary | ICD-10-CM | POA: Diagnosis not present

## 2015-05-28 DIAGNOSIS — I10 Essential (primary) hypertension: Secondary | ICD-10-CM | POA: Insufficient documentation

## 2015-05-28 LAB — COMPREHENSIVE METABOLIC PANEL
ALT: 16 U/L — AB (ref 17–63)
AST: 25 U/L (ref 15–41)
Albumin: 4.2 g/dL (ref 3.5–5.0)
Alkaline Phosphatase: 76 U/L (ref 38–126)
Anion gap: 12 (ref 5–15)
BUN: 20 mg/dL (ref 6–20)
CHLORIDE: 104 mmol/L (ref 101–111)
CO2: 24 mmol/L (ref 22–32)
CREATININE: 1 mg/dL (ref 0.61–1.24)
Calcium: 9.7 mg/dL (ref 8.9–10.3)
Glucose, Bld: 106 mg/dL — ABNORMAL HIGH (ref 65–99)
POTASSIUM: 3.3 mmol/L — AB (ref 3.5–5.1)
Sodium: 140 mmol/L (ref 135–145)
Total Bilirubin: 1 mg/dL (ref 0.3–1.2)
Total Protein: 7.3 g/dL (ref 6.5–8.1)

## 2015-05-28 LAB — URINALYSIS, ROUTINE W REFLEX MICROSCOPIC
GLUCOSE, UA: NEGATIVE mg/dL
Hgb urine dipstick: NEGATIVE
Ketones, ur: 15 mg/dL — AB
LEUKOCYTES UA: NEGATIVE
NITRITE: NEGATIVE
PROTEIN: NEGATIVE mg/dL
Specific Gravity, Urine: 1.027 (ref 1.005–1.030)
pH: 6 (ref 5.0–8.0)

## 2015-05-28 LAB — CBC
HCT: 39.4 % (ref 39.0–52.0)
Hemoglobin: 13 g/dL (ref 13.0–17.0)
MCH: 31.9 pg (ref 26.0–34.0)
MCHC: 33 g/dL (ref 30.0–36.0)
MCV: 96.8 fL (ref 78.0–100.0)
PLATELETS: 267 10*3/uL (ref 150–400)
RBC: 4.07 MIL/uL — AB (ref 4.22–5.81)
RDW: 13.5 % (ref 11.5–15.5)
WBC: 10.2 10*3/uL (ref 4.0–10.5)

## 2015-05-28 LAB — I-STAT TROPONIN, ED: TROPONIN I, POC: 0 ng/mL (ref 0.00–0.08)

## 2015-05-28 LAB — LIPASE, BLOOD: LIPASE: 43 U/L (ref 11–51)

## 2015-05-28 MED ORDER — LORAZEPAM 0.5 MG PO TABS
0.5000 mg | ORAL_TABLET | Freq: Once | ORAL | Status: AC
  Administered 2015-05-28: 0.5 mg via ORAL
  Filled 2015-05-28: qty 1

## 2015-05-28 NOTE — ED Provider Notes (Signed)
CSN: [id number]     Arrival date & time 05/28/15  1015 History   First MD Initiated Contact with Patient 05/28/15 1030     Chief Complaint  Patient presents with  . Shortness of Breath  . Abdominal Pain     (Consider location/radiation/quality/duration/timing/severity/associated sxs/prior Treatment) HPI   Patient is a pleasant 70 year old male presenting with his of breath. Patient's had this happen multiple times over the last several months. He has had extensive workup for this done. He had both cardiology and pulmonology see patient in the hospital. They found EF 65-70% with grade 1 diastolic dysfunction;negative stress test, PFTs which showed evidence of restrictive lung disease, negative CT angio scan chest and planned EGD for next month.  Patient says he wakes up several times at night and feels shortness of breath. He's been prescribed Ativan. He reports he takes it and it does help him. Last night it did help him feel a little bit better. He reports he takes it and then walks around for a little while. He last took it at 3 AM. He reports that he can calm down and his shortness of breath comes down as well.  No chest pain associated with this episode of stress of breath today.   Past Medical History  Diagnosis Date  . ALLERGIC RHINITIS 02/16/2007  . BACK PAIN 02/16/2007  . FOOT PAIN, CHRONIC 02/27/2008  . 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 HYPERLIPIDEMIA 02/16/2007  . HYPERTENSION 02/16/2007  . PROSTATE SPECIFIC ANTIGEN, ELEVATED 02/16/2007  . 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 Diverticulosis 09/27/2013  . Shortness of breath dyspnea   . COPD (chronic obstructive pulmonary disease) [facility])    Past Surgical History  Procedure Laterality Date  . Foot surgery      reconstruction x's 4 after work accident   Family History  Problem Relation Age of Onset  . Cancer Mother     throat & lung  . Coronary artery disease Father   . Hypertension Father   . Cancer Sister   . Alcohol abuse  Brother    Social History  Substance Use Topics  . Smoking status: Former Smoker -- 0.25 packs/day for 30 years    Types: Cigarettes    Quit date: 04/27/1984  . Smokeless tobacco: Never Used  . Alcohol Use: No     Comment: no    Review of Systems  Constitutional: Negative for activity change.  HENT: Positive for congestion.   Respiratory: Positive for shortness of breath. Negative for chest tightness.   Cardiovascular: Negative for chest pain and leg swelling.  Gastrointestinal: Positive for abdominal pain.  Genitourinary: Negative for dysuria.  Musculoskeletal: Negative for back pain.  Neurological: Negative for dizziness.  Psychiatric/Behavioral: The patient is nervous/anxious.   All other systems reviewed and are negative.     Allergies  Review of patient's allergies indicates no known allergies.  Home Medications   Prior to Admission medications   Medication Sig Start Date End Date Taking? Authorizing Provider  albuterol (PROVENTIL HFA;VENTOLIN HFA) 108 (90 Base) MCG/ACT inhaler Inhale 1-2 puffs into the lungs every 6 (six) hours as needed for wheezing or shortness of breath.    Yes Historical Provider, MD  albuterol (PROVENTIL) (2.5 MG/3ML) 0.083% nebulizer solution Take 2.5 mg by nebulization every 6 (six) hours as needed for wheezing or shortness of breath.   Yes Historical Provider, MD  ALPRAZolam (XANAX) 0.25 MG tablet Take 1 tablet (0.25 mg total) by mouth 2 (two) times daily  as needed for anxiety. 04/17/15  Yes Biagio Borg, MD  atenolol (TENORMIN) 25 MG tablet Take 1 tablet (25 mg total) by mouth daily. 06/26/14  Yes Philemon Kingdom, MD  famotidine (PEPCID) 20 MG tablet One at bedtime Patient taking differently: Take 20 mg by mouth at bedtime.  05/14/15  Yes Tanda Rockers, MD  finasteride (PROSCAR) 5 MG tablet Take 1 tablet (5 mg total) by mouth daily. 09/27/13  Yes Biagio Borg, MD  levocetirizine (XYZAL) 5 MG tablet Take 1 tablet (5 mg total) by mouth daily as  needed for allergies. 05/07/15  Yes Biagio Borg, MD  losartan-hydrochlorothiazide (HYZAAR) 50-12.5 MG tablet Take 1 tablet by mouth daily. 05/14/15  Yes Tanda Rockers, MD  mometasone (NASONEX) 50 MCG/ACT nasal spray Place 2 sprays into the nose daily. 05/27/15  Yes Binnie Rail, MD  pantoprazole (PROTONIX) 40 MG tablet Take 1 tablet (40 mg total) by mouth daily. 04/17/15  Yes Biagio Borg, MD  sertraline (ZOLOFT) 100 MG tablet Take 1 tablet (100 mg total) by mouth daily. 04/17/15  Yes Biagio Borg, MD  simvastatin (ZOCOR) 40 MG tablet Take 1 tablet (40 mg total) by mouth daily at 6 PM. Patient taking differently: Take 40 mg by mouth every morning.  10/18/14  Yes Biagio Borg, MD   BP 138/83 mmHg  Pulse 93  Temp(Src) 98.6 F (37 C) (Oral)  Resp 16  Ht 5\' 6"  (1.676 m)  Wt 151 lb (68.493 kg)  BMI 24.38 kg/m2  SpO2 97% Physical Exam  Constitutional: He is oriented to person, place, and time. He appears well-nourished.  HENT:  Head: Normocephalic and atraumatic.  Mouth/Throat: Oropharynx is clear and moist.  Mild erythema to bilateral turbinates.  Eyes: Conjunctivae are normal.  Neck: No tracheal deviation present.  Cardiovascular: Normal rate.   Pulmonary/Chest: Effort normal. No stridor. No respiratory distress. He has no wheezes. He has no rales. He exhibits no tenderness.  Normal respiratory rate and effort.  Abdominal: Soft. There is no tenderness. There is no guarding.  Musculoskeletal: Normal range of motion. He exhibits no edema.  Neurological: He is oriented to person, place, and time. No cranial nerve deficit.  Skin: Skin is warm and dry. No rash noted. He is not diaphoretic.  Psychiatric: He has a normal mood and affect. His behavior is normal.  Nursing note and vitals reviewed.   ED Course  Procedures (including critical care time) Labs Review Labs Reviewed  COMPREHENSIVE METABOLIC PANEL - Abnormal; Notable for the following:    Potassium 3.3 (*)    Glucose, Bld 106  (*)    ALT 16 (*)    All other components within normal limits  CBC - Abnormal; Notable for the following:    RBC 4.07 (*)    All other components within normal limits  URINALYSIS, ROUTINE W REFLEX MICROSCOPIC (NOT AT [facility]) - Abnormal; Notable for the following:    Bilirubin Urine SMALL (*)    Ketones, ur 15 (*)    All other components within normal limits  LIPASE, BLOOD  I-STAT TROPOININ, ED    Imaging Review Dg Chest 2 View  05/28/2015  CLINICAL DATA:  Mid abdominal pain.  Some nausea vomiting. EXAM: CHEST  2 VIEW COMPARISON:  05/01/2015 FINDINGS: Cardiomediastinal silhouette is normal. Mediastinal contours appear intact. There is no evidence of focal airspace consolidation, pleural effusion or pneumothorax. Prominence of the right hilum with linear bronchiectasis is stable, and represents an area of scarring in  correlation with prior CT dated 04/02/2015. Osseous structures are without acute abnormality. Soft tissues are grossly normal. IMPRESSION: No active cardiopulmonary disease. Electronically Signed   By: Fidela Salisbury M.D.   On: 05/28/2015 11:39   I have personally reviewed and evaluated these images and lab results as part of my medical decision-making.   EKG Interpretation   Date/Time:  Tuesday May 28 2015 11:24:16 EST Ventricular Rate:  80 PR Interval:  142 QRS Duration: 87 QT Interval:  368 QTC Calculation: 424 R Axis:   88 Text Interpretation:  Sinus rhythm LAE, consider biatrial enlargement  Borderline right axis deviation Nonspecific T abnormalities, inferior  leads no acute ischemia No significant change since last tracing Confirmed  by Gerald Leitz ([id number]) on 05/28/2015 11:37:27 AM      MDM   Final diagnoses:  None   patient's a very friendly 70 year old male presenting with shortness of breath also times over the night last night. Patient reports that he has this many nights for the last couple months. He reports he is prescribed Ativan for  this. He reports it does help with his anxiety and feelings of shortness of breath.  No risk factrors for PE, normal angio recently.  See history of present illness for detailed account of lengthy workup for this been completed already for the shortness of breath.  We will do single troponin, EKG, chest x-ray. We will give him Ativan to help with the symptoms. Patient has normal physical exam and vital signs. I think this is likely his anxiety. We'll have him follow-up with his primary care physician as well as with his GI physician as planned.  11:50 AM Patient still has normal vitals. EKG nl,. CXR nl.  Will discharge with follow up GI and PCP.   Brantlee Penn Julio Alm, MD 05/28/15 1151

## 2015-05-28 NOTE — ED Notes (Signed)
Patient states he has been having SOB x 2 months. Patient states he was told by one physician that he had COPD and another one said no. Patient also c/o abdominal pain and having abdominal tightness. Patient states he is suppose to have an EGD on 2/171/17. Patient had a BM 2 days ago and c/o nausea.

## 2015-05-28 NOTE — Telephone Encounter (Signed)
Please advise patient was seen in the ED

## 2015-05-28 NOTE — ED Notes (Signed)
Bed: WA08 Expected date:  Expected time:  Means of arrival:  Comments: 

## 2015-05-28 NOTE — ED Notes (Signed)
Bed: WA07 Expected date:  Expected time:  Means of arrival:  Comments: 

## 2015-05-28 NOTE — Telephone Encounter (Signed)
patiwent walked in today and had just been seen at the ED. The doctor that saw the patient in the ED recommended that we change the dosage of the current RX for ALPRAZolam (XANAX) 0.25 MG tablet [id number] to a dosage that lasts all day in 1 tablet.

## 2015-05-28 NOTE — Discharge Instructions (Signed)
We cannot find reason for shortness of breath today. We think it may be related to anxiety given that Ativan helps make it better at home. Your vital signs are all normal today and chest x-ray and EKG look normal. Patient follow up with her PCP who may need to put you on an anxiety medication that you take that is longer-lasting.   Shortness of Breath Shortness of breath means you have trouble breathing. Shortness of breath needs medical care right away. HOME CARE   Do not smoke.  Avoid being around chemicals or things (paint fumes, dust) that may bother your breathing.  Rest as needed. Slowly begin your normal activities.  Only take medicines as told by your doctor.  Keep all doctor visits as told. GET HELP RIGHT AWAY IF:   Your shortness of breath gets worse.  You feel lightheaded, pass out (faint), or have a cough that is not helped by medicine.  You cough up blood.  You have pain with breathing.  You have pain in your chest, arms, shoulders, or belly (abdomen).  You have a fever.  You cannot walk up stairs or exercise the way you normally do.  You do not get better in the time expected.  You have a hard time doing normal activities even with rest.  You have problems with your medicines.  You have any new symptoms. MAKE SURE YOU:  Understand these instructions.  Will watch your condition.  Will get help right away if you are not doing well or get worse.   This information is not intended to replace advice given to you by your health care provider. Make sure you discuss any questions you have with your health care provider.   Document Released: 09/30/2007 Document Revised: 04/18/2013 Document Reviewed: 06/29/2011 Elsevier Interactive Patient Education [employer].

## 2015-05-29 MED ORDER — CLONAZEPAM 0.5 MG PO TABS: 0.5000 mg | ORAL_TABLET | Freq: Two times a day (BID) | ORAL | Status: DC | PRN

## 2015-05-29 NOTE — Telephone Encounter (Signed)
We would not want xanax XR as this can be very expensive  OK to change the current xanax to klonopin ,5 bid prn - Done hardcopy to [employer]

## 2015-05-29 NOTE — Addendum Note (Signed)
Addended by: Biagio Borg on: 05/29/2015 12:54 PM   Modules accepted: Orders, Medications

## 2015-05-29 NOTE — Telephone Encounter (Signed)
Medication printed signed and faxed

## 2015-06-10 ENCOUNTER — Telehealth: Payer: Self-pay | Admitting: Internal Medicine

## 2015-06-10 NOTE — Telephone Encounter (Signed)
Spoke with [name] and food/medicine questions answered.

## 2015-06-14 ENCOUNTER — Ambulatory Visit (AMBULATORY_SURGERY_CENTER): Payer: Medicare Other | Admitting: Internal Medicine

## 2015-06-14 ENCOUNTER — Encounter: Payer: Self-pay | Admitting: Internal Medicine

## 2015-06-14 VITALS — BP 94/54 | HR 56 | Temp 96.9°F | Resp 13 | Ht 64.0 in | Wt 148.0 lb

## 2015-06-14 DIAGNOSIS — D125 Benign neoplasm of sigmoid colon: Secondary | ICD-10-CM | POA: Diagnosis not present

## 2015-06-14 DIAGNOSIS — D122 Benign neoplasm of ascending colon: Secondary | ICD-10-CM

## 2015-06-14 DIAGNOSIS — Z86010 Personal history of colonic polyps: Secondary | ICD-10-CM | POA: Diagnosis not present

## 2015-06-14 DIAGNOSIS — R1310 Dysphagia, unspecified: Secondary | ICD-10-CM

## 2015-06-14 DIAGNOSIS — I10 Essential (primary) hypertension: Secondary | ICD-10-CM | POA: Diagnosis not present

## 2015-06-14 MED ORDER — SODIUM CHLORIDE 0.9 % IV SOLN: 500.0000 mL | INTRAVENOUS | Status: DC

## 2015-06-14 NOTE — Progress Notes (Signed)
Called to room to assist during endoscopic procedure.  Patient ID and intended procedure confirmed with present staff. Received instructions for my participation in the procedure from the performing physician.  

## 2015-06-14 NOTE — Op Note (Signed)
[place]  [address] [place], [id number]   COLONOSCOPY PROCEDURE REPORT  PATIENT: Jack [name], Jack [name]  MR#: [id number] BIRTHDATE: 06-21-1945 , 69  yrs. old GENDER: male ENDOSCOPIST: Gatha Mayer, MD, [facility] PROCEDURE DATE:  06/14/2015 PROCEDURE:   Colonoscopy, surveillance and Colonoscopy with snare polypectomy First Screening Colonoscopy - Avg.  risk and is 50 yrs.  old or older - No.  Prior Negative Screening - Now for repeat screening. N/A  History of Adenoma - Now for follow-up colonoscopy & has been > or = to 3 yrs.  Yes hx of adenoma.  Has been 3 or more years since last colonoscopy.  Polyps removed today? Yes ASA CLASS:   Class II INDICATIONS:Surveillance due to prior colonic neoplasia and PH Colon Adenoma. MEDICATIONS: Propofol 50 mg IV and Monitored anesthesia care  DESCRIPTION OF PROCEDURE:   After the risks benefits and alternatives of the procedure were thoroughly explained, informed consent was obtained.  The digital rectal exam revealed no abnormalities of the rectum, revealed the prostate was not enlarged, and revealed no prostatic nodules.   The LB [id number]  endoscope was introduced through the anus and advanced to the cecum, which was identified by both the appendix and ileocecal valve. No adverse events experienced.   The quality of the prep was (MiraLax was used) good.  The instrument was then slowly withdrawn as the colon was fully examined. Estimated blood loss is zero unless otherwise noted in this procedure report.  COLON FINDINGS: Two polypoid shaped sessile polyps ranging from 4 to 62mm in size were found in the sigmoid colon and ascending colon. Polypectomies were performed with a cold snare.  The resection was complete, the polyp tissue was completely retrieved and sent to histology.   There was diverticulosis noted in the sigmoid colon. The examination was otherwise normal.  Retroflexed views revealed no abnormalities. The  time to cecum = 2.5 Withdrawal time = 9.7 The scope was withdrawn and the procedure completed. COMPLICATIONS: There were no immediate complications.  ENDOSCOPIC IMPRESSION: 1.   Two sessile polyps ranging from 4 to 78mm in size were found in the sigmoid colon and ascending colon; polypectomies were performed with a cold snare 2.   Diverticulosis was noted in the sigmoid colon 3.   The examination was otherwise normal  RECOMMENDATIONS: Timing of repeat colonoscopy will be determined by pathology findings.  he has hx 6 adenomas max 12 mm 2008  eSigned:  Gatha Mayer, MD, [facility] 06/14/2015 3:53 PM   cc: The Patient and Dr. Cathlean Cower

## 2015-06-14 NOTE — Patient Instructions (Addendum)
The upper endoscopy was normal - I bet the pantoprazole fixed your problem - please stay on that.  I removed 2 small polyps from the colon. I will let you know pathology results and when to have another routine colonoscopy by mail.  I appreciate the opportunity to care for you. Gatha Mayer, MD, FACG  YOU HAD AN ENDOSCOPIC PROCEDURE TODAY AT [facility]:   Refer to the procedure report that was given to you for any specific questions about what was found during the examination.  If the procedure report does not answer your questions, please call your gastroenterologist to clarify.  If you requested that your care partner not be given the details of your procedure findings, then the procedure report has been included in a sealed envelope for you to review at your convenience later.  YOU SHOULD EXPECT: Some feelings of bloating in the abdomen. Passage of more gas than usual.  Walking can help get rid of the air that was put into your GI tract during the procedure and reduce the bloating. If you had a lower endoscopy (such as a colonoscopy or flexible sigmoidoscopy) you may notice spotting of blood in your stool or on the toilet paper. If you underwent a bowel prep for your procedure, you may not have a normal bowel movement for a few days.  Please Note:  You might notice some irritation and congestion in your nose or some drainage.  This is from the oxygen used during your procedure.  There is no need for concern and it should clear up in a day or so.  SYMPTOMS TO REPORT IMMEDIATELY:   Following lower endoscopy (colonoscopy or flexible sigmoidoscopy):  Excessive amounts of blood in the stool  Significant tenderness or worsening of abdominal pains  Swelling of the abdomen that is new, acute  Fever of 100F or higher   Following upper endoscopy (EGD)  Vomiting of blood or coffee ground material  New chest pain or pain under the shoulder blades  Painful or  persistently difficult swallowing  New shortness of breath  Fever of 100F or higher  Black, tarry-looking stools  For urgent or emergent issues, a gastroenterologist can be reached at any hour by calling [phone].   DIET: Your first meal following the procedure should be a small meal and then it is ok to progress to your normal diet. Heavy or fried foods are harder to digest and may make you feel nauseous or bloated.  Likewise, meals heavy in dairy and vegetables can increase bloating.  Drink plenty of fluids but you should avoid alcoholic beverages for 24 hours.  ACTIVITY:  You should plan to take it easy for the rest of today and you should NOT DRIVE or use heavy machinery until tomorrow (because of the sedation medicines used during the test).    FOLLOW UP: Our staff will call the number listed on your records the next business day following your procedure to check on you and address any questions or concerns that you may have regarding the information given to you following your procedure. If we do not reach you, we will leave a message.  However, if you are feeling well and you are not experiencing any problems, there is no need to return our call.  We will assume that you have returned to your regular daily activities without incident.  If any biopsies were taken you will be contacted by phone or by letter within the next 1-3 weeks.  Please call us at [phone] if you have not heard about the biopsies in 3 weeks.    SIGNATURES/CONFIDENTIALITY: You and/or your care partner have signed paperwork which will be entered into your electronic medical record.  These signatures attest to the fact that that the information above on your After Visit Summary has been reviewed and is understood.  Full responsibility of the confidentiality of this discharge information lies with you and/or your care-partner.  Polyps, diverticulosis-handouts given  Repeat colonoscopy will be determined by  pathology.

## 2015-06-14 NOTE — Op Note (Signed)
[place]  [address]. [name], [address]   ENDOSCOPY PROCEDURE REPORT  PATIENT: [name], Jack  MR#: [id number] BIRTHDATE: 1945-09-30 , 69  yrs. old GENDER: male ENDOSCOPIST: Gatha Mayer, MD, [facility] PROCEDURE DATE:  06/14/2015 PROCEDURE:  EGD, diagnostic ASA CLASS:     Class II INDICATIONS:  dysphagia. MEDICATIONS: Propofol 150 mg IV and Monitored anesthesia care TOPICAL ANESTHETIC: none  DESCRIPTION OF PROCEDURE: After the risks benefits and alternatives of the procedure were thoroughly explained, informed consent was obtained.  The LB [id number] endoscope was introduced through the mouth and advanced to the second portion of the duodenum , Without limitations.  The instrument was slowly withdrawn as the mucosa was fully examined.      EXAM: The esophagus and gastroesophageal junction were completely normal in appearance.  The stomach was entered and closely examined.The antrum, angularis, and lesser curvature were well visualized, including a retroflexed view of the cardia and fundus. The stomach wall was normally distensable.  The scope passed easily through the pylorus into the duodenum.  Retroflexed views revealed no abnormalities.     The scope was then withdrawn from the patient and the procedure completed.  COMPLICATIONS: There were no immediate complications.  ENDOSCOPIC IMPRESSION: Normal appearing esophagus and GE junction, the stomach was well visualized and normal in appearance, normal appearing duodenum  RECOMMENDATIONS: 1.  Proceed with a Colonoscopy. 2.  No dilation performed - dysphagia has resolved. 3.  Continue PPI  - pantoprazole    eSigned:  Gatha Mayer, MD, [facility] 06/14/2015 3:47 PM    CC: The Patient and Dr. Cathlean Cower

## 2015-06-14 NOTE — Progress Notes (Signed)
To recovery, report to Mir[place], RN, VSS. 

## 2015-06-16 DIAGNOSIS — R0602 Shortness of breath: Secondary | ICD-10-CM | POA: Diagnosis not present

## 2015-06-17 ENCOUNTER — Telehealth: Payer: Self-pay | Admitting: *Deleted

## 2015-06-17 NOTE — Telephone Encounter (Signed)
  Follow up Call-  Call back number 06/14/2015  Post procedure Call Back phone  # [phone]  Permission to leave phone message Yes     Patient questions:  Do you have a fever, pain , or abdominal swelling? No. Pain Score  0 *  Have you tolerated food without any problems? Yes.    Have you been able to return to your normal activities? Yes.    Do you have any questions about your discharge instructions: Diet   No. Medications  No. Follow up visit  No.  Do you have questions or concerns about your Care? No.  Actions: * If pain score is 4 or above: No action needed, pain <4.

## 2015-06-19 ENCOUNTER — Encounter: Payer: Self-pay | Admitting: Internal Medicine

## 2015-06-19 DIAGNOSIS — Z86010 Personal history of colonic polyps: Secondary | ICD-10-CM

## 2015-06-19 NOTE — Progress Notes (Signed)
Quick Note:  2 adenomas < 1 cm Recall 2022 ______

## 2015-07-01 ENCOUNTER — Ambulatory Visit: Payer: Medicare Other | Admitting: Family Medicine

## 2015-07-14 DIAGNOSIS — R0602 Shortness of breath: Secondary | ICD-10-CM | POA: Diagnosis not present

## 2015-08-06 ENCOUNTER — Telehealth: Payer: Self-pay

## 2015-08-06 MED ORDER — FINASTERIDE 5 MG PO TABS: 5.0000 mg | ORAL_TABLET | Freq: Every day | ORAL | Status: DC

## 2015-08-06 MED ORDER — PANTOPRAZOLE SODIUM 40 MG PO TBEC: 40.0000 mg | DELAYED_RELEASE_TABLET | Freq: Every day | ORAL | Status: DC

## 2015-08-06 MED ORDER — LOSARTAN POTASSIUM-HCTZ 50-12.5 MG PO TABS: 1.0000 | ORAL_TABLET | Freq: Every day | ORAL | Status: DC

## 2015-08-06 MED ORDER — ATENOLOL 25 MG PO TABS: 25.0000 mg | ORAL_TABLET | Freq: Every day | ORAL | Status: DC

## 2015-08-06 MED ORDER — MOMETASONE FUROATE 50 MCG/ACT NA SUSP: 2.0000 | Freq: Every day | NASAL | Status: DC

## 2015-08-06 NOTE — Telephone Encounter (Signed)
Medications have been sent to pharmacy 

## 2015-08-14 DIAGNOSIS — R0602 Shortness of breath: Secondary | ICD-10-CM | POA: Diagnosis not present

## 2015-08-30 ENCOUNTER — Other Ambulatory Visit: Payer: Self-pay

## 2015-08-30 MED ORDER — SIMVASTATIN 40 MG PO TABS: 40.0000 mg | ORAL_TABLET | Freq: Every day | ORAL | Status: DC

## 2015-09-13 ENCOUNTER — Telehealth: Payer: Self-pay

## 2015-09-13 NOTE — Telephone Encounter (Signed)
Insurance called and said the Pharmacy that can be used is [facility] order. Fax: [phone]

## 2015-10-07 ENCOUNTER — Other Ambulatory Visit: Payer: Self-pay | Admitting: *Deleted

## 2015-10-07 MED ORDER — CETIRIZINE HCL 10 MG PO TABS: 10.0000 mg | ORAL_TABLET | Freq: Every day | ORAL | Status: DC

## 2015-10-08 ENCOUNTER — Telehealth: Payer: Self-pay | Admitting: Internal Medicine

## 2015-10-08 NOTE — Telephone Encounter (Signed)
Called and spoke to patients wife. Patient had question about how to take the nebulizer solution

## 2015-10-08 NOTE — Telephone Encounter (Signed)
Jack [name],spouse, request to speak to the assistant concern about Nebulizer. Please call her back  # [phone]

## 2015-11-21 ENCOUNTER — Other Ambulatory Visit: Payer: Self-pay | Admitting: *Deleted

## 2015-11-21 MED ORDER — ALBUTEROL SULFATE (2.5 MG/3ML) 0.083% IN NEBU: 2.5000 mg | INHALATION_SOLUTION | Freq: Four times a day (QID) | RESPIRATORY_TRACT | 1 refills | Status: DC | PRN

## 2015-11-21 MED ORDER — MOMETASONE FUROATE 50 MCG/ACT NA SUSP: 2.0000 | Freq: Every day | NASAL | 1 refills | Status: DC

## 2015-11-21 MED ORDER — FINASTERIDE 5 MG PO TABS: 5.0000 mg | ORAL_TABLET | Freq: Every day | ORAL | 1 refills | Status: DC

## 2015-11-21 MED ORDER — SERTRALINE HCL 100 MG PO TABS: 100.0000 mg | ORAL_TABLET | Freq: Every day | ORAL | 1 refills | Status: DC

## 2015-11-21 MED ORDER — ALBUTEROL SULFATE HFA 108 (90 BASE) MCG/ACT IN AERS: 1.0000 | INHALATION_SPRAY | Freq: Four times a day (QID) | RESPIRATORY_TRACT | 0 refills | Status: DC | PRN

## 2015-11-21 MED ORDER — ATENOLOL 25 MG PO TABS: 25.0000 mg | ORAL_TABLET | Freq: Every day | ORAL | 1 refills | Status: DC

## 2015-11-21 MED ORDER — PANTOPRAZOLE SODIUM 40 MG PO TBEC: 40.0000 mg | DELAYED_RELEASE_TABLET | Freq: Every day | ORAL | 1 refills | Status: DC

## 2015-11-21 MED ORDER — LEVOCETIRIZINE DIHYDROCHLORIDE 5 MG PO TABS
5.0000 mg | ORAL_TABLET | Freq: Every day | ORAL | 1 refills | Status: DC | PRN
Start: 2015-11-21 — End: 2016-06-01

## 2015-11-21 MED ORDER — FAMOTIDINE 20 MG PO TABS
20.0000 mg | ORAL_TABLET | Freq: Every day | ORAL | 1 refills | Status: DC
Start: 2015-11-21 — End: 2016-03-06

## 2015-11-21 MED ORDER — LOSARTAN POTASSIUM-HCTZ 50-12.5 MG PO TABS: 1.0000 | ORAL_TABLET | Freq: Every day | ORAL | 1 refills | Status: DC

## 2015-11-21 NOTE — Telephone Encounter (Signed)
Rec'd call from pt daughter she states dad insurance has change, and he has to use mail service now. Needing all rx's sent to [facility]. He is almost out of his nebulizer solo needing that ASAP. Inform pt daughter will send to [facility]...Johny Chess

## 2015-11-29 ENCOUNTER — Other Ambulatory Visit: Payer: Self-pay | Admitting: Internal Medicine

## 2015-12-03 ENCOUNTER — Ambulatory Visit (INDEPENDENT_AMBULATORY_CARE_PROVIDER_SITE_OTHER): Payer: Medicare Other | Admitting: Internal Medicine

## 2015-12-03 ENCOUNTER — Encounter: Payer: Self-pay | Admitting: Internal Medicine

## 2015-12-03 ENCOUNTER — Other Ambulatory Visit: Payer: Self-pay | Admitting: *Deleted

## 2015-12-03 ENCOUNTER — Other Ambulatory Visit: Payer: Self-pay | Admitting: Internal Medicine

## 2015-12-03 ENCOUNTER — Other Ambulatory Visit (INDEPENDENT_AMBULATORY_CARE_PROVIDER_SITE_OTHER): Payer: Medicare Other

## 2015-12-03 VITALS — BP 120/78 | HR 90 | Temp 98.9°F | Resp 20 | Wt 150.0 lb

## 2015-12-03 DIAGNOSIS — R6889 Other general symptoms and signs: Secondary | ICD-10-CM

## 2015-12-03 DIAGNOSIS — I10 Essential (primary) hypertension: Secondary | ICD-10-CM | POA: Diagnosis not present

## 2015-12-03 DIAGNOSIS — R062 Wheezing: Secondary | ICD-10-CM | POA: Insufficient documentation

## 2015-12-03 DIAGNOSIS — M79673 Pain in unspecified foot: Secondary | ICD-10-CM

## 2015-12-03 DIAGNOSIS — E785 Hyperlipidemia, unspecified: Secondary | ICD-10-CM | POA: Diagnosis not present

## 2015-12-03 DIAGNOSIS — R059 Cough, unspecified: Secondary | ICD-10-CM | POA: Insufficient documentation

## 2015-12-03 DIAGNOSIS — R894 Abnormal immunological findings in specimens from other organs, systems and tissues: Secondary | ICD-10-CM | POA: Diagnosis not present

## 2015-12-03 DIAGNOSIS — Z1159 Encounter for screening for other viral diseases: Secondary | ICD-10-CM

## 2015-12-03 DIAGNOSIS — Z0001 Encounter for general adult medical examination with abnormal findings: Secondary | ICD-10-CM

## 2015-12-03 DIAGNOSIS — E0590 Thyrotoxicosis, unspecified without thyrotoxic crisis or storm: Secondary | ICD-10-CM

## 2015-12-03 DIAGNOSIS — N400 Enlarged prostate without lower urinary tract symptoms: Secondary | ICD-10-CM

## 2015-12-03 DIAGNOSIS — R05 Cough: Secondary | ICD-10-CM | POA: Insufficient documentation

## 2015-12-03 DIAGNOSIS — M545 Low back pain: Secondary | ICD-10-CM

## 2015-12-03 DIAGNOSIS — R768 Other specified abnormal immunological findings in serum: Secondary | ICD-10-CM

## 2015-12-03 LAB — CBC WITH DIFFERENTIAL/PLATELET
Basophils Absolute: 0 10*3/uL (ref 0.0–0.1)
Basophils Relative: 0.4 % (ref 0.0–3.0)
EOS ABS: 0.1 10*3/uL (ref 0.0–0.7)
Eosinophils Relative: 0.9 % (ref 0.0–5.0)
HCT: 39.7 % (ref 39.0–52.0)
HEMOGLOBIN: 13.5 g/dL (ref 13.0–17.0)
LYMPHS ABS: 2.6 10*3/uL (ref 0.7–4.0)
Lymphocytes Relative: 30.2 % (ref 12.0–46.0)
MCHC: 33.9 g/dL (ref 30.0–36.0)
MCV: 95.3 fl (ref 78.0–100.0)
MONO ABS: 0.8 10*3/uL (ref 0.1–1.0)
Monocytes Relative: 9.5 % (ref 3.0–12.0)
NEUTROS PCT: 59 % (ref 43.0–77.0)
Neutro Abs: 5 10*3/uL (ref 1.4–7.7)
Platelets: 189 10*3/uL (ref 150.0–400.0)
RBC: 4.17 Mil/uL — AB (ref 4.22–5.81)
RDW: 13.7 % (ref 11.5–15.5)
WBC: 8.5 10*3/uL (ref 4.0–10.5)

## 2015-12-03 LAB — URINALYSIS, ROUTINE W REFLEX MICROSCOPIC
Bilirubin Urine: NEGATIVE
Hgb urine dipstick: NEGATIVE
KETONES UR: NEGATIVE
Leukocytes, UA: NEGATIVE
NITRITE: NEGATIVE
SPECIFIC GRAVITY, URINE: 1.025 (ref 1.000–1.030)
TOTAL PROTEIN, URINE-UPE24: NEGATIVE
URINE GLUCOSE: NEGATIVE
Urobilinogen, UA: 0.2 (ref 0.0–1.0)
pH: 6 (ref 5.0–8.0)

## 2015-12-03 LAB — BASIC METABOLIC PANEL
BUN: 16 mg/dL (ref 6–23)
CHLORIDE: 107 meq/L (ref 96–112)
CO2: 27 meq/L (ref 19–32)
CREATININE: 1.18 mg/dL (ref 0.40–1.50)
Calcium: 9.6 mg/dL (ref 8.4–10.5)
GFR: 78.44 mL/min (ref >60.00)
Glucose, Bld: 76 mg/dL (ref 70–99)
Potassium: 4 mEq/L (ref 3.5–5.1)
Sodium: 141 mEq/L (ref 135–145)

## 2015-12-03 LAB — LIPID PANEL
Cholesterol: 198 mg/dL (ref 0–200)
HDL: 57.3 mg/dL (ref >39.00)
LDL Cholesterol: 103 mg/dL — ABNORMAL HIGH (ref 0–99)
NONHDL: 141.09
TRIGLYCERIDES: 191 mg/dL — AB (ref 0.0–149.0)
Total CHOL/HDL Ratio: 3
VLDL: 38.2 mg/dL (ref 0.0–40.0)

## 2015-12-03 LAB — TSH: TSH: 0.29 u[IU]/mL — AB (ref 0.35–4.50)

## 2015-12-03 LAB — HEPATIC FUNCTION PANEL
ALBUMIN: 4 g/dL (ref 3.5–5.2)
ALT: 15 U/L (ref 0–53)
AST: 22 U/L (ref 0–37)
Alkaline Phosphatase: 73 U/L (ref 39–117)
Bilirubin, Direct: 0.1 mg/dL (ref 0.0–0.3)
Total Bilirubin: 0.5 mg/dL (ref 0.2–1.2)
Total Protein: 7.2 g/dL (ref 6.0–8.3)

## 2015-12-03 MED ORDER — TIZANIDINE HCL 4 MG PO TABS: 4.0000 mg | ORAL_TABLET | Freq: Four times a day (QID) | ORAL | 1 refills | Status: DC | PRN

## 2015-12-03 MED ORDER — SIMVASTATIN 40 MG PO TABS: 40.0000 mg | ORAL_TABLET | Freq: Every day | ORAL | 0 refills | Status: DC

## 2015-12-03 MED ORDER — ATORVASTATIN CALCIUM 20 MG PO TABS: 20.0000 mg | ORAL_TABLET | Freq: Every day | ORAL | 3 refills | Status: DC

## 2015-12-03 NOTE — Patient Instructions (Addendum)
Ok to stop the simavastatin, and the nasonex  Please take all new medication as prescribed - the lipitor, and OTC nasacort AQ for the sinuses, and the muscle relaxer as needed  You inhaler is refilled, but you may want to check on the cost of Proair or Proventil as these may be less expensive  Please continue all other medications as before, and refills have been done if requested.  Please have the pharmacy call with any other refills you may need.  Please continue your efforts at being more active, low cholesterol diet, and weight control.  You are otherwise up to date with prevention measures today.  Please keep your appointments with your specialists as you may have planned  You will be contacted regarding the referral for: urology for oct 23, and podiatry  Please go to the LAB in the Basement (turn left off the elevator) for the tests to be done today  You will be contacted by phone if any changes need to be made immediately.  Otherwise, you will receive a letter about your results with an explanation, but please check with MyChart first.  Please remember to sign up for MyChart if you have not done so, as this will be important to you in the future with finding out test results, communicating by private email, and scheduling acute appointments online when needed.  Please return in 6 months, or sooner if needed, with Lab testing done 3-5 days before

## 2015-12-03 NOTE — Progress Notes (Signed)
Pre visit review using our clinic review tool, if applicable. No additional management support is needed unless otherwise documented below in the visit note. 

## 2015-12-03 NOTE — Progress Notes (Signed)
Subjective:    Patient ID: Jack [name], male    DOB: February 12, 1946, 70 y.o.   MRN: [id number]  HPI  Here for wellness and f/u;  Overall doing ok;  Pt denies Chest pain, worsening SOB, DOE, wheezing, orthopnea, PND, worsening LE edema, palpitations, dizziness or syncope.  Pt denies neurological change such as new headache, facial or extremity weakness.  Pt denies polydipsia, polyuria, or low sugar symptoms. Pt states overall good compliance with treatment and medications, good tolerability, and has been trying to follow appropriate diet.  Pt denies worsening depressive symptoms, suicidal ideation or panic. No fever, night sweats, wt loss, loss of appetite, or other constitutional symptoms.  Pt states good ability with ADL's, has low fall risk, home safety reviewed and adequate, no other significant changes in hearing or vision, and only occasionally active with exercise. Not taking the simvastatin as now too expensive with his insurance., Ventolin is $95., and nasonex very expensive as well.  Sees urology oct 23 with PSA  C/o bilat symptomatic bunions right > left worsening for several yrs, now with pain with most steps, asks for podiatry referral.    Pt continues to have recurring left LBP without change in severity, bowel or bladder change, fever, wt loss,  worsening LE pain/numbness/weakness, gait change or falls. Past Medical History:  Diagnosis Date  . ALLERGIC RHINITIS 02/16/2007  . 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   . BACK PAIN 02/16/2007  . COPD (chronic obstructive pulmonary disease) ([place])   . Diverticulosis 09/27/2013  . FOOT PAIN, CHRONIC 02/27/2008  . Hepatitis C antibody test positive   . 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 HYPERLIPIDEMIA 02/16/2007  . HYPERTENSION 02/16/2007  . Personal history of colonic polyps 03/08/2007  . PROSTATE SPECIFIC ANTIGEN, ELEVATED 02/16/2007  . Shortness of breath dyspnea    Past Surgical History:  Procedure Laterality Date  . FOOT SURGERY     reconstruction  x's 4 after work accident    reports that he quit smoking about 31 years ago. His smoking use included Cigarettes. He has a 7.50 pack-year smoking history. He has never used smokeless tobacco. He reports that he does not drink alcohol or use drugs. family history includes Alcohol abuse in his brother; Cancer in his mother and sister; Coronary artery disease in his father; Hypertension in his father. No Known Allergies Current Outpatient Prescriptions on File Prior to Visit  Medication Sig Dispense Refill  . albuterol (PROVENTIL HFA;VENTOLIN HFA) 108 (90 Base) MCG/ACT inhaler Inhale 1-2 puffs into the lungs every 6 (six) hours as needed for wheezing or shortness of breath. 54 g 0  . atenolol (TENORMIN) 25 MG tablet Take 1 tablet (25 mg total) by mouth daily. 90 tablet 1  . cetirizine (ZYRTEC) 10 MG tablet Take 1 tablet (10 mg total) by mouth daily. 90 tablet 1  . clonazePAM (KLONOPIN) 0.5 MG tablet Take 1 tablet (0.5 mg total) by mouth 2 (two) times daily as needed for anxiety. 60 tablet 1  . famotidine (PEPCID) 20 MG tablet Take 1 tablet (20 mg total) by mouth at bedtime. 90 tablet 1  . finasteride (PROSCAR) 5 MG tablet Take 1 tablet (5 mg total) by mouth daily. 90 tablet 1  . levocetirizine (XYZAL) 5 MG tablet Take 1 tablet (5 mg total) by mouth daily as needed for allergies. 90 tablet 1  . losartan-hydrochlorothiazide (HYZAAR) 50-12.5 MG tablet Take 1 tablet by mouth daily. 90 tablet 1  . pantoprazole (PROTONIX) 40 MG tablet Take 1  tablet (40 mg total) by mouth daily. 90 tablet 1  . sertraline (ZOLOFT) 100 MG tablet Take 1 tablet (100 mg total) by mouth daily. 90 tablet 1   No current facility-administered medications on file prior to visit.    Review of Systems Constitutional: Negative for increased diaphoresis, or other activity, appetite or siginficant weight change other than noted HENT: Negative for worsening hearing loss, ear pain, facial swelling, mouth sores and neck stiffness.     Eyes: Negative for other worsening pain, redness or visual disturbance.  Respiratory: Negative for choking or stridor Cardiovascular: Negative for other chest pain and palpitations.  Gastrointestinal: Negative for worsening diarrhea, blood in stool, or abdominal distention Genitourinary: Negative for hematuria, flank pain or change in urine volume.  Musculoskeletal: Negative for myalgias or other joint complaints.  Skin: Negative for other color change and wound or drainage.  Neurological: Negative for syncope and numbness. other than noted Hematological: Negative for adenopathy. or other swelling Psychiatric/Behavioral: Negative for hallucinations, SI, self-injury, decreased concentration or other worsening agitation.      Objective:   Physical Exam BP 120/78   Pulse 90   Temp 98.9 F (37.2 C) (Oral)   Resp 20   Wt 150 lb (68 kg)   SpO2 96%   BMI 25.75 kg/m  VS noted,  Constitutional: Pt is oriented to person, place, and time. Appears well-developed and well-nourished, in no significant distress Head: Normocephalic and atraumatic  Eyes: Conjunctivae and EOM are normal. Pupils are equal, round, and reactive to light Right Ear: External ear normal.  Left Ear: External ear normal Nose: Nose normal.  Mouth/Throat: Oropharynx is clear and moist  Neck: Normal range of motion. Neck supple. No JVD present. No tracheal deviation present or significant neck LA or mass Cardiovascular: Normal rate, regular rhythm, normal heart sounds and intact distal pulses.   Pulmonary/Chest: Effort normal and breath sounds without rales or wheezing  Abdominal: Soft. Bowel sounds are normal. NT. No HSM  Musculoskeletal: Normal range of motion. Exhibits no edema Lymphadenopathy: Has no cervical adenopathy.  Neurological: Pt is alert and oriented to person, place, and time. Pt has normal reflexes. No cranial nerve deficit. Motor grossly intact Skin: Skin is warm and dry. No rash noted or new  ulcers Psychiatric:  Has normal mood and affect. Behavior is normal.  Spine nontender, left lumbar tender spasm noted bilat feet with first MTP swelling, tender medially    Assessment & Plan:

## 2015-12-04 ENCOUNTER — Encounter: Payer: Self-pay | Admitting: Internal Medicine

## 2015-12-04 ENCOUNTER — Other Ambulatory Visit: Payer: Self-pay | Admitting: Internal Medicine

## 2015-12-04 DIAGNOSIS — R768 Other specified abnormal immunological findings in serum: Secondary | ICD-10-CM

## 2015-12-04 DIAGNOSIS — N400 Enlarged prostate without lower urinary tract symptoms: Secondary | ICD-10-CM | POA: Insufficient documentation

## 2015-12-04 DIAGNOSIS — Z0001 Encounter for general adult medical examination with abnormal findings: Secondary | ICD-10-CM | POA: Insufficient documentation

## 2015-12-04 DIAGNOSIS — M79673 Pain in unspecified foot: Secondary | ICD-10-CM | POA: Insufficient documentation

## 2015-12-04 LAB — HEPATITIS C ANTIBODY: HCV AB: REACTIVE — AB

## 2015-12-04 NOTE — Assessment & Plan Note (Signed)
stable overall by history and exam, recent data reviewed with pt, and pt to continue medical treatment as before,  to f/u any worsening symptoms or concerns BP Readings from Last 3 Encounters:  12/03/15 120/78  06/14/15 (!) 94/54  05/28/15 112/62

## 2015-12-04 NOTE — Assessment & Plan Note (Addendum)
C/w bilat bunions, for podiatry referral  In addition to the time spent performing CPE, I spent an additional 25 minutes face to face,in which greater than 50% of this time was spent in counseling and coordination of care for patient's acute illness as documented.

## 2015-12-04 NOTE — Assessment & Plan Note (Signed)
Recent onset with exam d/w msk strain, for tizanidine prn,  to f/u any worsening symptoms or concerns\

## 2015-12-04 NOTE — Assessment & Plan Note (Signed)

## 2015-12-04 NOTE — Assessment & Plan Note (Signed)
Pt requests referral back to urology

## 2015-12-04 NOTE — Assessment & Plan Note (Signed)
Ok to change med to lipitor as uncontrolled, cont follow lower chol diet

## 2015-12-05 ENCOUNTER — Telehealth: Payer: Self-pay

## 2015-12-05 DIAGNOSIS — M25579 Pain in unspecified ankle and joints of unspecified foot: Secondary | ICD-10-CM

## 2015-12-05 NOTE — Telephone Encounter (Signed)
Referral done

## 2015-12-05 NOTE — Telephone Encounter (Signed)
Patient is requesting a referral to see his foot doctor. Dr. Georges Mouse The phone number is [phone]. He needs to see him about his foot. Please advise or follow up, Thank you. HE already has an app there at Tuesday 8/15.

## 2015-12-06 ENCOUNTER — Other Ambulatory Visit: Payer: Medicare Other

## 2015-12-06 DIAGNOSIS — R768 Other specified abnormal immunological findings in serum: Secondary | ICD-10-CM

## 2015-12-06 DIAGNOSIS — R894 Abnormal immunological findings in specimens from other organs, systems and tissues: Secondary | ICD-10-CM | POA: Diagnosis not present

## 2015-12-06 DIAGNOSIS — Z1159 Encounter for screening for other viral diseases: Secondary | ICD-10-CM | POA: Diagnosis not present

## 2015-12-10 LAB — HEPATITIS C RNA QUANTITATIVE
HCV QUANT: NOT DETECTED [IU]/mL (ref <15)
HCV Quantitative: NOT DETECTED IU/mL (ref <15)

## 2015-12-24 DIAGNOSIS — M2012 Hallux valgus (acquired), left foot: Secondary | ICD-10-CM | POA: Diagnosis not present

## 2015-12-24 DIAGNOSIS — M2042 Other hammer toe(s) (acquired), left foot: Secondary | ICD-10-CM | POA: Diagnosis not present

## 2016-01-09 ENCOUNTER — Other Ambulatory Visit: Payer: Self-pay | Admitting: *Deleted

## 2016-01-09 ENCOUNTER — Ambulatory Visit: Payer: Medicare Other | Admitting: Endocrinology

## 2016-01-09 MED ORDER — LOSARTAN POTASSIUM-HCTZ 50-12.5 MG PO TABS: 1.0000 | ORAL_TABLET | Freq: Every day | ORAL | 3 refills | Status: DC

## 2016-01-09 NOTE — Telephone Encounter (Signed)
Rec'd call pt states husband is needing his Losartan sent to [place]. Notified pt wife sending electronically as we speak.Marland KitchenJohny Chess

## 2016-01-29 ENCOUNTER — Ambulatory Visit: Payer: Medicare Other | Admitting: Endocrinology

## 2016-01-29 ENCOUNTER — Encounter: Payer: Self-pay | Admitting: Endocrinology

## 2016-01-29 VITALS — BP 146/94 | HR 61 | Wt 197.4 lb

## 2016-01-29 DIAGNOSIS — E0590 Thyrotoxicosis, unspecified without thyrotoxic crisis or storm: Secondary | ICD-10-CM | POA: Diagnosis not present

## 2016-01-29 MED ORDER — METHIMAZOLE 5 MG PO TABS: 5.0000 mg | ORAL_TABLET | ORAL | 3 refills | Status: DC

## 2016-01-29 NOTE — Patient Instructions (Signed)
I have sent a prescription to your pharmacy, to slow the thyroid. If ever you have fever while taking methimazole, stop it and call us, even if the reason is obvious, because of the risk of a rare side-effect. Please come back for a follow-up appointment in 2 months.   

## 2016-01-29 NOTE — Progress Notes (Signed)
Subjective:    Patient ID: Jack [name], male    DOB: 24-Aug-1945, 70 y.o.   MRN: [id number]  HPI Pt was first found to have slightly suppressed TSH in 2009.  He has never been on therapy for this.  He has never had XRT to the anterior neck, or thyroid surgery.  He has never had thyroid imaging.  He does not consume kelp or any other prescribed or non-prescribed thyroid medication.  He has never been on amiodarone.  He has slight tremor of the hands, and assoc palpitations.  Past Medical History:  Diagnosis Date  . ALLERGIC RHINITIS 02/16/2007  . 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   . BACK PAIN 02/16/2007  . COPD (chronic obstructive pulmonary disease) ([place])   . Diverticulosis 09/27/2013  . FOOT PAIN, CHRONIC 02/27/2008  . Hepatitis C antibody test positive   . 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 HYPERLIPIDEMIA 02/16/2007  . HYPERTENSION 02/16/2007  . Personal history of colonic polyps 03/08/2007  . PROSTATE SPECIFIC ANTIGEN, ELEVATED 02/16/2007  . Shortness of breath dyspnea     Past Surgical History:  Procedure Laterality Date  . FOOT SURGERY     reconstruction x's 4 after work accident    Social History   Social History  . Marital status: Married    Spouse name: N/A  . Number of children: N/A  . Years of education: N/A   Occupational History  . Not on file.   Social History Main Topics  . Smoking status: Former Smoker    Packs/day: 0.25    Years: 30.00    Types: Cigarettes    Quit date: 04/27/1984  . Smokeless tobacco: Never Used  . Alcohol use No     Comment: no  . Drug use: No  . Sexual activity: Yes    Partners: Female   Other Topics Concern  . Not on file   Social History Narrative   11 grade education   Married 1968   2 son's='69, '70; 1 dtr '80; 5 grandsons   Retired on disability from foot injury. Had worked as a Ecologist   Marriage in good health-wife smokes 3 pks day    Current Outpatient Prescriptions on File Prior to Visit  Medication Sig  Dispense Refill  . albuterol (PROVENTIL HFA;VENTOLIN HFA) 108 (90 Base) MCG/ACT inhaler Inhale 1-2 puffs into the lungs every 6 (six) hours as needed for wheezing or shortness of breath. 54 g 0  . atenolol (TENORMIN) 25 MG tablet Take 1 tablet (25 mg total) by mouth daily. 90 tablet 1  . atorvastatin (LIPITOR) 20 MG tablet Take 1 tablet (20 mg total) by mouth daily. 90 tablet 3  . cetirizine (ZYRTEC) 10 MG tablet Take 1 tablet (10 mg total) by mouth daily. 90 tablet 1  . clonazePAM (KLONOPIN) 0.5 MG tablet Take 1 tablet (0.5 mg total) by mouth 2 (two) times daily as needed for anxiety. 60 tablet 1  . famotidine (PEPCID) 20 MG tablet Take 1 tablet (20 mg total) by mouth at bedtime. 90 tablet 1  . finasteride (PROSCAR) 5 MG tablet Take 1 tablet (5 mg total) by mouth daily. 90 tablet 1  . levocetirizine (XYZAL) 5 MG tablet Take 1 tablet (5 mg total) by mouth daily as needed for allergies. 90 tablet 1  . losartan-hydrochlorothiazide (HYZAAR) 50-12.5 MG tablet Take 1 tablet by mouth daily. 90 tablet 3  . pantoprazole (PROTONIX) 40 MG tablet Take 1 tablet (40 mg total) by mouth daily. [place]  tablet 1  . sertraline (ZOLOFT) 100 MG tablet Take 1 tablet (100 mg total) by mouth daily. 90 tablet 1  . tiZANidine (ZANAFLEX) 4 MG tablet Take 1 tablet (4 mg total) by mouth every 6 (six) hours as needed for muscle spasms. 40 tablet 1   No current facility-administered medications on file prior to visit.     No Known Allergies   BP (!) 146/94   Pulse 61   Wt 197 lb 6.4 oz (89.5 kg)   SpO2 95%   BMI 33.88 kg/m   Review of Systems denies weight loss, headache, hoarseness, visual loss, chest pain, sob, diarrhea, polyuria, muscle weakness, excessive diaphoresis, anxiety, heat intolerance, and easy bruising.  He has rhinorrhea.     Objective:   Physical Exam VS: see vs page GEN: no distress HEAD: head: no deformity eyes: no periorbital swelling, no proptosis external nose and ears are normal mouth: no  lesion seen NECK: supple, thyroid is not enlarged CHEST WALL: no deformity LUNGS: clear to auscultation CV: reg rate and rhythm, no murmur.  ABD: abdomen is soft, nontender.  no hepatosplenomegaly.  not distended.  no hernia MUSCULOSKELETAL: muscle bulk and strength are grossly normal.  no obvious joint swelling.  gait is normal and steady EXTEMITIES: no edema PULSES: no carotid bruit NEURO:  cn 2-12 grossly intact, except for hearing loss.   readily moves all 4's. Slight tremor of the hands SKIN:  Normal texture and temperature.  No rash or suspicious lesion is visible.  Not diaphoretic NODES:  None palpable at the neck.   PSYCH: alert, well-oriented.  Does not appear anxious nor depressed.     CT: no mention is made of the thyroid.    Lab Results  Component Value Date   TSH 0.29 (L) 12/03/2015   I have reviewed outside records, and summarized: Pt was noted to have slightly suppressed TSH, and ref here.  He was also noted to be in good general health.    Assessment & Plan:  Hyperthyroidism, uncertain etiology.  Mild.  Usually due to small multinodular goiter. Tremor, new to me, prob not thyroid-related.  In this setting, he can have a trial of anti-thyroid rx  Patient is advised the following: Patient Instructions  I have sent a prescription to your pharmacy, to slow the thyroid If ever you have fever while taking methimazole, stop it and call us, even if the reason is obvious, because of the risk of a rare side-effect. Please come back for a follow-up appointment in 2 months.

## 2016-02-17 DIAGNOSIS — R972 Elevated prostate specific antigen [PSA]: Secondary | ICD-10-CM | POA: Diagnosis not present

## 2016-02-17 DIAGNOSIS — N400 Enlarged prostate without lower urinary tract symptoms: Secondary | ICD-10-CM | POA: Diagnosis not present

## 2016-03-06 ENCOUNTER — Other Ambulatory Visit: Payer: Self-pay | Admitting: *Deleted

## 2016-03-06 MED ORDER — FAMOTIDINE 20 MG PO TABS: 20.0000 mg | ORAL_TABLET | Freq: Every day | ORAL | 1 refills | Status: DC

## 2016-03-09 ENCOUNTER — Ambulatory Visit (INDEPENDENT_AMBULATORY_CARE_PROVIDER_SITE_OTHER): Payer: Medicare Other

## 2016-03-09 DIAGNOSIS — Z23 Encounter for immunization: Secondary | ICD-10-CM

## 2016-03-29 NOTE — Progress Notes (Signed)
Subjective:    Patient ID: Jack [name], male    DOB: 1946-01-12, 70 y.o.   MRN: [id number]  HPI Pt returns for f/u of mild hyperthyroidism (dx'ed 2009; he was not on therapy for this until 2017; he has never had dedicated thyroid imaging, but chest CT in 2016 made no mention of the thyroid; trial of tapazole was chosen, due to tremor).  Since on the tapazole, he says the tremor is slightly better.   Past Medical History:  Diagnosis Date  . ALLERGIC RHINITIS 02/16/2007  . 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   . BACK PAIN 02/16/2007  . COPD (chronic obstructive pulmonary disease) ([place])   . Diverticulosis 09/27/2013  . FOOT PAIN, CHRONIC 02/27/2008  . Hepatitis C antibody test positive   . 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 HYPERLIPIDEMIA 02/16/2007  . HYPERTENSION 02/16/2007  . Personal history of colonic polyps 03/08/2007  . PROSTATE SPECIFIC ANTIGEN, ELEVATED 02/16/2007  . Shortness of breath dyspnea     Past Surgical History:  Procedure Laterality Date  . FOOT SURGERY     reconstruction x's 4 after work accident    Social History   Social History  . Marital status: Married    Spouse name: N/A  . Number of children: N/A  . Years of education: N/A   Occupational History  . Not on file.   Social History Main Topics  . Smoking status: Former Smoker    Packs/day: 0.25    Years: 30.00    Types: Cigarettes    Quit date: 04/27/1984  . Smokeless tobacco: Never Used  . Alcohol use No     Comment: no  . Drug use: No  . Sexual activity: Yes    Partners: Female   Other Topics Concern  . 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 albuterol (PROVENTIL HFA;VENTOLIN HFA) 108 (90 Base) MCG/ACT  inhaler Inhale 1-2 puffs into the lungs every 6 (six) hours as needed for wheezing or shortness of breath. 54 g 0  . atenolol (TENORMIN) 25 MG tablet Take 1 tablet (25 mg total) by mouth daily. 90 tablet 1  . atorvastatin (LIPITOR) 20 MG tablet Take 1 tablet (20 mg total) by mouth daily. 90 tablet 3  . cetirizine (ZYRTEC) 10 MG tablet Take 1 tablet (10 mg total) by mouth daily. 90 tablet 1  . clonazePAM (KLONOPIN) 0.5 MG tablet Take 1 tablet (0.5 mg total) by mouth 2 (two) times daily as needed for anxiety. 60 tablet 1  . famotidine (PEPCID) 20 MG tablet Take 1 tablet (20 mg total) by mouth at bedtime. 90 tablet 1  . finasteride (PROSCAR) 5 MG tablet Take 1 tablet (5 mg total) by mouth daily. 90 tablet 1  . levocetirizine (XYZAL) 5 MG tablet Take 1 tablet (5 mg total) by mouth daily as needed for allergies. 90 tablet 1  . losartan-hydrochlorothiazide (HYZAAR) 50-12.5 MG tablet Take 1 tablet by mouth daily. 90 tablet 3  . methimazole (TAPAZOLE) 5 MG tablet Take 1 tablet (5 mg total) by mouth 3 (three) times a week. 12 tablet 3  . pantoprazole (PROTONIX) 40 MG tablet Take 1  tablet (40 mg total) by mouth daily. 90 tablet 1  . sertraline (ZOLOFT) 100 MG tablet Take 1 tablet (100 mg total) by mouth daily. 90 tablet 1  . tiZANidine (ZANAFLEX) 4 MG tablet Take 1 tablet (4 mg total) by mouth every 6 (six) hours as needed for muscle spasms. 40 tablet 1   No current facility-administered medications on file prior to visit.     No Known Allergies  Family History  Problem Relation Age of Onset  . Cancer Mother     throat & lung  . Coronary artery disease Father   . Hypertension Father   . Cancer Sister   . Alcohol abuse Brother   . Thyroid disease Neg Hx     BP 140/90   Pulse 60   Wt 198 lb 6.4 oz (90 kg)   SpO2 95%   BMI 34.06 kg/m   Review of Systems Denies fever.      Objective:   Physical Exam VITAL SIGNS:  See vs page GENERAL: no distress NECK: There is no palpable thyroid  enlargement.  No thyroid nodule is palpable.  No palpable lymphadenopathy at the anterior neck.  Lab Results  Component Value Date   TSH 0.38 03/30/2016      Assessment & Plan:  Hyperthyroidism: well-controlled.  Please continue the same medications

## 2016-03-30 ENCOUNTER — Encounter: Payer: Self-pay | Admitting: Endocrinology

## 2016-03-30 ENCOUNTER — Ambulatory Visit (INDEPENDENT_AMBULATORY_CARE_PROVIDER_SITE_OTHER): Payer: Medicare Other | Admitting: Endocrinology

## 2016-03-30 VITALS — BP 140/90 | HR 60 | Wt 198.4 lb

## 2016-03-30 DIAGNOSIS — E0590 Thyrotoxicosis, unspecified without thyrotoxic crisis or storm: Secondary | ICD-10-CM

## 2016-03-30 LAB — T4, FREE: Free T4: 0.74 ng/dL (ref 0.60–1.60)

## 2016-03-30 LAB — TSH: TSH: 0.38 u[IU]/mL (ref 0.35–4.50)

## 2016-03-30 NOTE — Patient Instructions (Addendum)
blood tests are requested for you today.  We'll let you know about the results. If ever you have fever while taking methimazole, stop it and call us, even if the reason is obvious, because of the risk of a rare side-effect.  Please come back for a follow-up appointment in 3 months.   

## 2016-04-09 ENCOUNTER — Other Ambulatory Visit: Payer: Self-pay | Admitting: Internal Medicine

## 2016-04-10 ENCOUNTER — Other Ambulatory Visit: Payer: Self-pay | Admitting: Internal Medicine

## 2016-04-10 NOTE — Telephone Encounter (Signed)
This has been faxed.

## 2016-04-10 NOTE — Telephone Encounter (Signed)
Done hardcopy to Corinne  

## 2016-04-11 ENCOUNTER — Other Ambulatory Visit: Payer: Self-pay | Admitting: Endocrinology

## 2016-06-01 ENCOUNTER — Encounter: Payer: Self-pay | Admitting: Endocrinology

## 2016-06-01 ENCOUNTER — Ambulatory Visit (INDEPENDENT_AMBULATORY_CARE_PROVIDER_SITE_OTHER): Payer: Medicare Other | Admitting: Endocrinology

## 2016-06-01 ENCOUNTER — Other Ambulatory Visit: Payer: Self-pay | Admitting: *Deleted

## 2016-06-01 VITALS — BP 138/86 | HR 70 | Ht 64.0 in | Wt 207.0 lb

## 2016-06-01 DIAGNOSIS — E0590 Thyrotoxicosis, unspecified without thyrotoxic crisis or storm: Secondary | ICD-10-CM | POA: Diagnosis not present

## 2016-06-01 LAB — TSH: TSH: 0.42 u[IU]/mL (ref 0.35–4.50)

## 2016-06-01 LAB — T4, FREE: FREE T4: 0.71 ng/dL (ref 0.60–1.60)

## 2016-06-01 MED ORDER — LEVOCETIRIZINE DIHYDROCHLORIDE 5 MG PO TABS: 5.0000 mg | ORAL_TABLET | Freq: Every day | ORAL | 1 refills | Status: AC | PRN

## 2016-06-01 MED ORDER — CETIRIZINE HCL 10 MG PO TABS: 10.0000 mg | ORAL_TABLET | Freq: Every day | ORAL | 1 refills | Status: DC

## 2016-06-01 NOTE — Patient Instructions (Addendum)
blood tests are requested for you today.  We'll let you know about the results. If ever you have fever while taking methimazole, stop it and call us, even if the reason is obvious, because of the risk of a rare side-effect.  Please come back for a follow-up appointment in 3 months.   

## 2016-06-01 NOTE — Progress Notes (Signed)
Subjective:    Patient ID: Jack [name], male    DOB: 14-Jan-1946, 71 y.o.   MRN: [id number]  HPI Pt returns for f/u of mild hyperthyroidism (dx'ed 2009; he was not on therapy for this until 2017; he has never had dedicated thyroid imaging, but chest CT in 2016 made no mention of the thyroid; trial of tapazole was chosen, due to tremor).  Since on the tapazole, pt states he feels well in general.  He says the pharmacist told him to take tapazole 5 mg every 3 weeks, rather than 3 times per week.   Past Medical History:  Diagnosis Date  . ALLERGIC RHINITIS 02/16/2007  . 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   . BACK PAIN 02/16/2007  . COPD (chronic obstructive pulmonary disease) ([place])   . Diverticulosis 09/27/2013  . FOOT PAIN, CHRONIC 02/27/2008  . Hepatitis C antibody test positive   . 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 HYPERLIPIDEMIA 02/16/2007  . HYPERTENSION 02/16/2007  . Personal history of colonic polyps 03/08/2007  . PROSTATE SPECIFIC ANTIGEN, ELEVATED 02/16/2007  . Shortness of breath dyspnea     Past Surgical History:  Procedure Laterality Date  . FOOT SURGERY     reconstruction x's 4 after work accident    Social History   Social History  . Marital status: Married    Spouse name: N/A  . Number of children: N/A  . Years of education: N/A   Occupational History  . Not on file.   Social History Main Topics  . Smoking status: Former Smoker    Packs/day: 0.25    Years: 30.00    Types: Cigarettes    Quit date: 04/27/1984  . Smokeless tobacco: Never Used  . Alcohol use No     Comment: no  . Drug use: No  . Sexual activity: Yes    Partners: Female   Other Topics Concern  . 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 albuterol (PROVENTIL HFA;VENTOLIN HFA) 108 (90 Base) MCG/ACT inhaler Inhale 1-2 puffs into the lungs every 6 (six) hours as needed for wheezing or shortness of breath. 54 g 0  . atenolol (TENORMIN) 25 MG tablet Take 1 tablet (25 mg total) by mouth daily. 90 tablet 1  . atorvastatin (LIPITOR) 20 MG tablet Take 1 tablet (20 mg total) by mouth daily. 90 tablet 3  . clonazePAM (KLONOPIN) 0.5 MG tablet TAKE 1 TABLET BY MOUTH TWICE A DAY AS NEEDED FOR ANXIETY 60 tablet 1  . famotidine (PEPCID) 20 MG tablet Take 1 tablet (20 mg total) by mouth at bedtime. 90 tablet 1  . finasteride (PROSCAR) 5 MG tablet TAKE 1 TABLET BY MOUTH EVERY DAY 90 tablet 0  . losartan-hydrochlorothiazide (HYZAAR) 50-12.5 MG tablet Take 1 tablet by mouth daily. 90 tablet 3  . methimazole (TAPAZOLE) 5 MG tablet TAKE 1 TABLET (5 MG TOTAL) BY MOUTH 3 (THREE) TIMES A WEEK. 12 tablet 3  . pantoprazole (PROTONIX) 40 MG tablet Take 1 tablet (40 mg total) by mouth daily. 90 tablet 1  . sertraline (ZOLOFT) 100 MG tablet Take 1 tablet (100 mg total) by mouth daily.  90 tablet 1  . tiZANidine (ZANAFLEX) 4 MG tablet Take 1 tablet (4 mg total) by mouth every 6 (six) hours as needed for muscle spasms. 40 tablet 1   No current facility-administered medications on file prior to visit.     No Known Allergies  Family History  Problem Relation Age of Onset  . Cancer Mother     throat & lung  . Coronary artery disease Father   . Hypertension Father   . Cancer Sister   . Alcohol abuse Brother   . Thyroid disease Neg Hx    BP 138/86   Pulse 70   Ht 5\' 4"  (1.626 m)   Wt 207 lb (93.9 kg)   SpO2 96%   BMI 35.53 kg/m   Review of Systems Denies fever.      Objective:   Physical Exam VITAL SIGNS:  See vs page GENERAL: no distress NECK: There is no palpable thyroid enlargement.  No thyroid nodule is palpable.  No palpable lymphadenopathy at the anterior neck.  Skin: not diaphoretic.   Neuro: no  tremor.    Lab Results  Component Value Date   TSH 0.42 06/01/2016      Assessment & Plan:  Hyperthyroidism: well-controlled, but control will be less prone to re-exacerbation on the TIW tapazole.  I advised him to take TIW

## 2016-06-01 NOTE — Addendum Note (Signed)
Addended by: Earnstine Regal on: 06/01/2016 03:48 PM   Modules accepted: Orders

## 2016-06-02 ENCOUNTER — Telehealth: Payer: Self-pay | Admitting: Endocrinology

## 2016-06-02 NOTE — Telephone Encounter (Signed)
Pt is aware of the lab results and the medication instructions

## 2016-06-02 NOTE — Telephone Encounter (Signed)
Noted  

## 2016-06-24 ENCOUNTER — Other Ambulatory Visit: Payer: Self-pay | Admitting: Internal Medicine

## 2016-07-29 ENCOUNTER — Other Ambulatory Visit: Payer: Self-pay | Admitting: Internal Medicine

## 2016-08-07 ENCOUNTER — Other Ambulatory Visit: Payer: Self-pay | Admitting: Internal Medicine

## 2016-08-07 MED ORDER — CLONAZEPAM 0.5 MG PO TABS: ORAL_TABLET | ORAL | 1 refills | Status: DC

## 2016-08-07 NOTE — Telephone Encounter (Signed)
Done hardcopy to shirron 

## 2016-08-07 NOTE — Telephone Encounter (Signed)
Pt wife call requesting a refill for the pt on his clonazePAM (KLONOPIN) 0.5 MG tablet to be sent in to [facility] on [address].

## 2016-08-07 NOTE — Telephone Encounter (Signed)
faxed

## 2016-08-28 ENCOUNTER — Encounter: Payer: Self-pay | Admitting: Endocrinology

## 2016-08-28 ENCOUNTER — Ambulatory Visit (INDEPENDENT_AMBULATORY_CARE_PROVIDER_SITE_OTHER): Payer: Medicare Other | Admitting: Endocrinology

## 2016-08-28 DIAGNOSIS — E0590 Thyrotoxicosis, unspecified without thyrotoxic crisis or storm: Secondary | ICD-10-CM

## 2016-08-28 LAB — T4, FREE: Free T4: 0.59 ng/dL — ABNORMAL LOW (ref 0.60–1.60)

## 2016-08-28 LAB — TSH: TSH: 0.51 u[IU]/mL (ref 0.35–4.50)

## 2016-08-28 NOTE — Patient Instructions (Addendum)
blood tests are requested for you today.  We'll let you know about the results. If ever you have fever while taking methimazole, stop it and call us, even if the reason is obvious, because of the risk of a rare side-effect. Please come back for a follow-up appointment in 4 months.  

## 2016-08-28 NOTE — Progress Notes (Signed)
Subjective:    Patient ID: Jack [name], male    DOB: [birth year], 71 y.o.   MRN: [id number]  HPI Pt returns for f/u of mild hyperthyroidism (dx'ed 2009; he was not on therapy for this until 2017; he has never had dedicated thyroid imaging, but chest CT in 2016 made no mention of the thyroid; trial of tapazole was chosen, due to tremor).  Since on the tapazole, pt states he feels well in general.  He takes tapazole 3 times per week, as rx'ed.  pt states he feels well in general.  Specifically, he denies tremor and palpitations.  Past Medical History:  Diagnosis Date  . ALLERGIC RHINITIS 02/16/2007  . 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   . BACK PAIN 02/16/2007  . COPD (chronic obstructive pulmonary disease) ([place])   . Diverticulosis 09/27/2013  . FOOT PAIN, CHRONIC 02/27/2008  . Hepatitis C antibody test positive   . 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 HYPERLIPIDEMIA 02/16/2007  . HYPERTENSION 02/16/2007  . Personal history of colonic polyps 03/08/2007  . PROSTATE SPECIFIC ANTIGEN, ELEVATED 02/16/2007  . Shortness of breath dyspnea     Past Surgical History:  Procedure Laterality Date  . FOOT SURGERY     reconstruction x's 4 after work accident    Social History   Social History  . Marital status: Married    Spouse name: N/A  . Number of children: N/A  . Years of education: N/A   Occupational History  . Not on file.   Social History Main Topics  . Smoking status: Former Smoker    Packs/day: 0.25    Years: 30.00    Types: Cigarettes    Quit date: 04/27/1984  . Smokeless tobacco: Never Used  . Alcohol use No     Comment: no  . Drug use: No  . Sexual activity: Yes    Partners: Female   Other Topics Concern  . Not on file   Social History Narrative   11 grade education   Married 1968   2 son's='69, '70; 1 dtr '80; 5 grandsons   Retired on disability from foot injury. Had worked as a Ecologist   Marriage in good health-wife smokes 3 pks day    Current Outpatient  Prescriptions on File Prior to Visit  Medication Sig Dispense Refill  . albuterol (PROVENTIL HFA;VENTOLIN HFA) 108 (90 Base) MCG/ACT inhaler Inhale 1-2 puffs into the lungs every 6 (six) hours as needed for wheezing or shortness of breath. 54 g 0  . atenolol (TENORMIN) 25 MG tablet TAKE 1 TABLET (25 MG TOTAL) BY MOUTH DAILY. 90 tablet 0  . atorvastatin (LIPITOR) 20 MG tablet Take 1 tablet (20 mg total) by mouth daily. 90 tablet 3  . cetirizine (ZYRTEC) 10 MG tablet Take 1 tablet (10 mg total) by mouth daily. 90 tablet 1  . clonazePAM (KLONOPIN) 0.5 MG tablet TAKE 1 TABLET BY MOUTH TWICE A DAY AS NEEDED FOR ANXIETY 60 tablet 1  . famotidine (PEPCID) 20 MG tablet Take 1 tablet (20 mg total) by mouth at bedtime. 90 tablet 1  . finasteride (PROSCAR) 5 MG tablet TAKE 1 TABLET BY MOUTH EVERY DAY 90 tablet 0  . levocetirizine (XYZAL) 5 MG tablet Take 1 tablet (5 mg total) by mouth daily as needed for allergies. 90 tablet 1  . losartan-hydrochlorothiazide (HYZAAR) 50-12.5 MG tablet Take 1 tablet by mouth daily. 90 tablet 3  . methimazole (TAPAZOLE) 5 MG tablet TAKE 1 TABLET (5 MG TOTAL) BY  MOUTH 3 (THREE) TIMES A WEEK. 12 tablet 3  . pantoprazole (PROTONIX) 40 MG tablet Take 1 tablet (40 mg total) by mouth daily. 90 tablet 1  . sertraline (ZOLOFT) 100 MG tablet Take 1 tablet (100 mg total) by mouth daily. 90 tablet 1  . tiZANidine (ZANAFLEX) 4 MG tablet Take 1 tablet (4 mg total) by mouth every 6 (six) hours as needed for muscle spasms. 40 tablet 1   No current facility-administered medications on file prior to visit.     No Known Allergies  Family History  Problem Relation Age of Onset  . Cancer Mother     throat & lung  . Coronary artery disease Father   . Hypertension Father   . Cancer Sister   . Alcohol abuse Brother   . Thyroid disease Neg Hx     BP 132/86   Pulse 78   Ht 5\' 4"  (1.626 m)   Wt 206 lb (93.4 kg)   SpO2 95%   BMI 35.36 kg/m    Review of Systems Denies fever.  no  tremor.     Objective:   Physical Exam VITAL SIGNS:  See vs page.  GENERAL: no distress.  NECK: There is no palpable thyroid enlargement.  No thyroid nodule is palpable.  No palpable lymphadenopathy at the anterior neck.  Skin: not diaphoretic.   Neuro: no tremor.      Assessment & Plan:  Hyperthyroidism, due for recheck.  Tremor: resolved on rx.  Patient Instructions  blood tests are requested for you today.  We'll let you know about the results. If ever you have fever while taking methimazole, stop it and call us, even if the reason is obvious, because of the risk of a rare side-effect.  Please come back for a follow-up appointment in 4 months.

## 2016-09-02 ENCOUNTER — Other Ambulatory Visit: Payer: Self-pay | Admitting: Endocrinology

## 2016-09-10 ENCOUNTER — Other Ambulatory Visit: Payer: Self-pay | Admitting: Internal Medicine

## 2016-09-26 ENCOUNTER — Other Ambulatory Visit: Payer: Self-pay | Admitting: Endocrinology

## 2016-10-01 ENCOUNTER — Other Ambulatory Visit: Payer: Self-pay | Admitting: Internal Medicine

## 2016-10-24 ENCOUNTER — Other Ambulatory Visit: Payer: Self-pay | Admitting: Internal Medicine

## 2016-11-05 ENCOUNTER — Telehealth: Payer: Self-pay | Admitting: Internal Medicine

## 2016-11-05 MED ORDER — CLONAZEPAM 0.5 MG PO TABS: ORAL_TABLET | ORAL | 1 refills | Status: DC

## 2016-11-05 NOTE — Telephone Encounter (Signed)
Patient is requesting refill on klonopin.  Has appt scheduled 8/9.  [facility] [address]

## 2016-11-05 NOTE — Telephone Encounter (Signed)
faxed

## 2016-11-05 NOTE — Telephone Encounter (Signed)
Done hardcopy to Shirron  

## 2016-11-19 ENCOUNTER — Other Ambulatory Visit: Payer: Self-pay | Admitting: Internal Medicine

## 2016-11-30 ENCOUNTER — Other Ambulatory Visit: Payer: Self-pay | Admitting: Internal Medicine

## 2016-12-03 ENCOUNTER — Encounter: Payer: Self-pay | Admitting: Internal Medicine

## 2016-12-03 ENCOUNTER — Ambulatory Visit (INDEPENDENT_AMBULATORY_CARE_PROVIDER_SITE_OTHER): Payer: Medicare Other | Admitting: Internal Medicine

## 2016-12-03 ENCOUNTER — Other Ambulatory Visit (INDEPENDENT_AMBULATORY_CARE_PROVIDER_SITE_OTHER): Payer: Medicare Other

## 2016-12-03 VITALS — BP 134/88 | HR 83 | Ht 64.0 in | Wt 210.0 lb

## 2016-12-03 DIAGNOSIS — Z0000 Encounter for general adult medical examination without abnormal findings: Secondary | ICD-10-CM | POA: Diagnosis not present

## 2016-12-03 DIAGNOSIS — Z23 Encounter for immunization: Secondary | ICD-10-CM | POA: Diagnosis not present

## 2016-12-03 DIAGNOSIS — R739 Hyperglycemia, unspecified: Secondary | ICD-10-CM | POA: Insufficient documentation

## 2016-12-03 LAB — LIPID PANEL
CHOLESTEROL: 195 mg/dL (ref 0–200)
HDL: 51.1 mg/dL (ref >39.00)
LDL Cholesterol: 115 mg/dL — ABNORMAL HIGH (ref 0–99)
NonHDL: 143.55
Total CHOL/HDL Ratio: 4
Triglycerides: 143 mg/dL (ref 0.0–149.0)
VLDL: 28.6 mg/dL (ref 0.0–40.0)

## 2016-12-03 LAB — CBC WITH DIFFERENTIAL/PLATELET
BASOS ABS: 0 10*3/uL (ref 0.0–0.1)
Basophils Relative: 0.4 % (ref 0.0–3.0)
EOS ABS: 0 10*3/uL (ref 0.0–0.7)
Eosinophils Relative: 0.6 % (ref 0.0–5.0)
HCT: 42.3 % (ref 39.0–52.0)
Hemoglobin: 14.4 g/dL (ref 13.0–17.0)
LYMPHS ABS: 2 10*3/uL (ref 0.7–4.0)
Lymphocytes Relative: 25.7 % (ref 12.0–46.0)
MCHC: 34 g/dL (ref 30.0–36.0)
MCV: 97 fl (ref 78.0–100.0)
MONOS PCT: 8.7 % (ref 3.0–12.0)
Monocytes Absolute: 0.7 10*3/uL (ref 0.1–1.0)
NEUTROS PCT: 64.6 % (ref 43.0–77.0)
Neutro Abs: 4.9 10*3/uL (ref 1.4–7.7)
Platelets: 184 10*3/uL (ref 150.0–400.0)
RBC: 4.35 Mil/uL (ref 4.22–5.81)
RDW: 13 % (ref 11.5–15.5)
WBC: 7.6 10*3/uL (ref 4.0–10.5)

## 2016-12-03 LAB — URINALYSIS, ROUTINE W REFLEX MICROSCOPIC
BILIRUBIN URINE: NEGATIVE
Hgb urine dipstick: NEGATIVE
KETONES UR: NEGATIVE
LEUKOCYTES UA: NEGATIVE
Nitrite: NEGATIVE
PH: 6 (ref 5.0–8.0)
RBC / HPF: NONE SEEN (ref 0–?)
Specific Gravity, Urine: >=1.03 — AB (ref 1.000–1.030)
TOTAL PROTEIN, URINE-UPE24: NEGATIVE
URINE GLUCOSE: NEGATIVE
UROBILINOGEN UA: 0.2 (ref 0.0–1.0)
WBC, UA: NONE SEEN (ref 0–?)

## 2016-12-03 LAB — BASIC METABOLIC PANEL
BUN: 15 mg/dL (ref 6–23)
CALCIUM: 9.4 mg/dL (ref 8.4–10.5)
CO2: 29 meq/L (ref 19–32)
CREATININE: 1.29 mg/dL (ref 0.40–1.50)
Chloride: 106 mEq/L (ref 96–112)
GFR: 70.57 mL/min (ref >60.00)
GLUCOSE: 107 mg/dL — AB (ref 70–99)
Potassium: 3.7 mEq/L (ref 3.5–5.1)
Sodium: 140 mEq/L (ref 135–145)

## 2016-12-03 LAB — HEPATIC FUNCTION PANEL
ALBUMIN: 4.3 g/dL (ref 3.5–5.2)
ALK PHOS: 79 U/L (ref 39–117)
ALT: 13 U/L (ref 0–53)
AST: 23 U/L (ref 0–37)
Bilirubin, Direct: 0.1 mg/dL (ref 0.0–0.3)
TOTAL PROTEIN: 7.3 g/dL (ref 6.0–8.3)
Total Bilirubin: 0.5 mg/dL (ref 0.2–1.2)

## 2016-12-03 LAB — TSH: TSH: 0.78 u[IU]/mL (ref 0.35–4.50)

## 2016-12-03 LAB — HEMOGLOBIN A1C: HEMOGLOBIN A1C: 5.8 % (ref 4.6–6.5)

## 2016-12-03 LAB — PSA: PSA: 2.78 ng/mL (ref 0.10–4.00)

## 2016-12-03 NOTE — Assessment & Plan Note (Signed)

## 2016-12-03 NOTE — Assessment & Plan Note (Signed)
Minor, asympt, stable overall by history and exam, recent data reviewed with pt, and pt to continue medical treatment as before,  to f/u any worsening symptoms or concerns, for f/u a1c

## 2016-12-03 NOTE — Progress Notes (Signed)
Subjective:    Patient ID: Jack [name], male    DOB: [birth year], 71 y.o.   MRN: [id number]  HPI  Here for wellness and f/u;  Overall doing ok;  Pt denies Chest pain, worsening SOB, DOE, wheezing, orthopnea, PND, worsening LE edema, palpitations, dizziness or syncope.  Pt denies neurological change such as new headache, facial or extremity weakness.  Pt denies polydipsia, polyuria, or low sugar symptoms. Pt states overall good compliance with treatment and medications, good tolerability, and has been trying to follow appropriate diet.  Pt denies worsening depressive symptoms, suicidal ideation or panic. No fever, night sweats, wt loss, loss of appetite, or other constitutional symptoms.  Pt states good ability with ADL's, has low fall risk, home safety reviewed and adequate, no other significant changes in hearing or vision, and only occasionally active with exercise.  Pt continues to have recurring LBP without change in severity, bowel or bladder change, fever, wt loss,  worsening LE pain/numbness/weakness, gait change or falls.  Due for Tdap Wt Readings from Last 3 Encounters:  12/03/16 210 lb (95.3 kg)  08/28/16 206 lb (93.4 kg)  06/01/16 207 lb (93.9 kg)   Past Medical History:  Diagnosis Date  . ALLERGIC RHINITIS 02/16/2007  . 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   . BACK PAIN 02/16/2007  . COPD (chronic obstructive pulmonary disease) ([place])   . Diverticulosis 09/27/2013  . FOOT PAIN, CHRONIC 02/27/2008  . Hepatitis C antibody test positive   . 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 HYPERLIPIDEMIA 02/16/2007  . HYPERTENSION 02/16/2007  . Personal history of colonic polyps 03/08/2007  . PROSTATE SPECIFIC ANTIGEN, ELEVATED 02/16/2007  . Shortness of breath dyspnea    Past Surgical History:  Procedure Laterality Date  . FOOT SURGERY     reconstruction x's 4 after work accident    reports that he quit smoking about 32 years ago. His smoking use included Cigarettes. He has a 7.50 pack-year smoking  history. He has never used smokeless tobacco. He reports that he does not drink alcohol or use drugs. family history includes Alcohol abuse in his brother; Cancer in his mother and sister; Coronary artery disease in his father; Hypertension in his father. No Known Allergies Current Outpatient Prescriptions on File Prior to Visit  Medication Sig Dispense Refill  . albuterol (PROVENTIL HFA;VENTOLIN HFA) 108 (90 Base) MCG/ACT inhaler Inhale 1-2 puffs into the lungs every 6 (six) hours as needed for wheezing or shortness of breath. 54 g 0  . atenolol (TENORMIN) 25 MG tablet Take 1 tablet (25 mg total) by mouth daily. Annual appt w/labs due in August must see MD for refills 90 tablet 0  . cetirizine (ZYRTEC) 10 MG tablet Take 1 tablet (10 mg total) by mouth daily. 90 tablet 1  . clonazePAM (KLONOPIN) 0.5 MG tablet TAKE 1 TABLET BY MOUTH TWICE A DAY AS NEEDED FOR ANXIETY 60 tablet 1  . famotidine (PEPCID) 20 MG tablet TAKE 1 TABLET (20 MG TOTAL) BY MOUTH AT BEDTIME. 90 tablet 0  . finasteride (PROSCAR) 5 MG tablet TAKE 1 TABLET BY MOUTH EVERY DAY 90 tablet 0  . levocetirizine (XYZAL) 5 MG tablet Take 1 tablet (5 mg total) by mouth daily as needed for allergies. 90 tablet 1  . losartan-hydrochlorothiazide (HYZAAR) 50-12.5 MG tablet Take 1 tablet by mouth daily. 90 tablet 3  . losartan-hydrochlorothiazide (HYZAAR) 50-12.5 MG tablet TAKE 1 TABLET BY MOUTH EVERY DAY 30 tablet 0  . methimazole (TAPAZOLE) 5 MG tablet TAKE 1  TABLET (5 MG TOTAL) BY MOUTH 3 (THREE) TIMES A WEEK. 12 tablet 3  . methimazole (TAPAZOLE) 5 MG tablet TAKE 1 TABLET (5 MG TOTAL) BY MOUTH 3 (THREE) TIMES A WEEK. 12 tablet 3  . pantoprazole (PROTONIX) 40 MG tablet Take 1 tablet (40 mg total) by mouth daily. 90 tablet 1  . sertraline (ZOLOFT) 100 MG tablet Take 1 tablet (100 mg total) by mouth daily. 90 tablet 1  . simvastatin (ZOCOR) 40 MG tablet TAKE 1 TABLET BY MOUTH EVERY DAY AT 6PM 90 tablet 0  . tiZANidine (ZANAFLEX) 4 MG tablet Take  1 tablet (4 mg total) by mouth every 6 (six) hours as needed for muscle spasms. 40 tablet 1  . atorvastatin (LIPITOR) 20 MG tablet Take 1 tablet (20 mg total) by mouth daily. 90 tablet 3   No current facility-administered medications on file prior to visit.    Review of Systems Constitutional: Negative for other unusual diaphoresis, sweats, appetite or weight changes HENT: Negative for other worsening hearing loss, ear pain, facial swelling, mouth sores or neck stiffness.   Eyes: Negative for other worsening pain, redness or other visual disturbance.  Respiratory: Negative for other stridor or swelling Cardiovascular: Negative for other palpitations or other chest pain  Gastrointestinal: Negative for worsening diarrhea or loose stools, blood in stool, distention or other pain Genitourinary: Negative for hematuria, flank pain or other change in urine volume.  Musculoskeletal: Negative for myalgias or other joint swelling.  Skin: Negative for other color change, or other wound or worsening drainage.  Neurological: Negative for other syncope or numbness. Hematological: Negative for other adenopathy or swelling Psychiatric/Behavioral: Negative for hallucinations, other worsening agitation, SI, self-injury, or new decreased concentration All other system neg per pt    Objective:   Physical Exam BP 134/88   Pulse 83   Ht 5\' 4"  (1.626 m)   Wt 210 lb (95.3 kg)   SpO2 98%   BMI 36.05 kg/m  VS noted, obese Constitutional: Pt is oriented to person, place, and time. Appears well-developed and well-nourished, in no significant distress and comfortable Head: Normocephalic and atraumatic  Eyes: Conjunctivae and EOM are normal. Pupils are equal, round, and reactive to light Right Ear: External ear normal without discharge Left Ear: External ear normal without discharge Nose: Nose without discharge or deformity Mouth/Throat: Oropharynx is without other ulcerations and moist  Neck: Normal range of  motion. Neck supple. No JVD present. No tracheal deviation present or significant neck LA or mass Cardiovascular: Normal rate, regular rhythm, normal heart sounds and intact distal pulses.   Pulmonary/Chest: WOB normal and breath sounds without rales or wheezing  Abdominal: Soft. Bowel sounds are normal. NT. No HSM  Musculoskeletal: Normal range of motion. Exhibits no edema, wearing chronic right leg brace due to prior right ankle fx and surgury Lymphadenopathy: Has no other cervical adenopathy.  Neurological: Pt is alert and oriented to person, place, and time. Pt has normal reflexes. No cranial nerve deficit. Motor grossly intact, Gait intact Skin: Skin is warm and dry. No rash noted or new ulcerations Psychiatric:  Has normal mood and affect. Behavior is normal without agitation No other exam findings Lab Results  Component Value Date   WBC 8.5 12/03/2015   HGB 13.5 12/03/2015   HCT 39.7 12/03/2015   PLT 189.0 12/03/2015   GLUCOSE 76 12/03/2015   CHOL 198 12/03/2015   TRIG 191.0 (H) 12/03/2015   HDL 57.30 12/03/2015   LDLDIRECT 86.0 10/04/2014   LDLCALC 103 (H)  12/03/2015   ALT 15 12/03/2015   AST 22 12/03/2015   NA 141 12/03/2015   K 4.0 12/03/2015   CL 107 12/03/2015   CREATININE 1.18 12/03/2015   BUN 16 12/03/2015   CO2 27 12/03/2015   TSH 0.51 08/28/2016   PSA 3.01 10/04/2014       Assessment & Plan:

## 2016-12-03 NOTE — Progress Notes (Signed)
Pre visit review using our clinic review tool, if applicable. No additional management support is needed unless otherwise documented below in the visit note. 

## 2016-12-03 NOTE — Progress Notes (Deleted)
Subjective:   Jack [name] is a 71 y.o. male who presents for Medicare Annual/Subsequent preventive examination.  Review of Systems:  No ROS.  Medicare Wellness Visit. Additional risk factors are reflected in the social history.    Sleep patterns: {SX; SLEEP PATTERNS:18802::"feels rested on waking","does not get up to void","gets up *** times nightly to void","sleeps *** hours nightly"}.    Home Safety/Smoke Alarms: Feels safe in home. Smoke alarms in place.  Living environment; residence and Firearm Safety: {Rehab home environment / accessibility:30080::"no firearms","firearms stored safely"}. Seat Belt Safety/Bike Helmet: Wears seat belt.   Counseling:   Eye Exam-  Dental-  Male:   CCS- Last 06/14/15,     PSA-  Lab Results  Component Value Date   PSA 3.01 10/04/2014   PSA 3.70 05/12/2011   PSA 12.17 (H) 02/10/2007       Objective:    Vitals: There were no vitals taken for this visit.  There is no height or weight on file to calculate BMI.  Tobacco History  Smoking Status  . Former Smoker  . Packs/day: 0.25  . Years: 30.00  . Types: Cigarettes  . Quit date: 04/27/1984  Smokeless Tobacco  . Never Used     Counseling given: Not Answered   Past Medical History:  Diagnosis Date  . ALLERGIC RHINITIS 02/16/2007  . 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   . BACK PAIN 02/16/2007  . COPD (chronic obstructive pulmonary disease) ([place])   . Diverticulosis 09/27/2013  . FOOT PAIN, CHRONIC 02/27/2008  . Hepatitis C antibody test positive   . 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 HYPERLIPIDEMIA 02/16/2007  . HYPERTENSION 02/16/2007  . Personal history of colonic polyps 03/08/2007  . PROSTATE SPECIFIC ANTIGEN, ELEVATED 02/16/2007  . Shortness of breath dyspnea    Past Surgical History:  Procedure Laterality Date  . FOOT SURGERY     reconstruction x's 4 after work accident   Family History  Problem Relation Age of Onset  . Cancer Mother        throat & lung  . Coronary artery  disease Father   . Hypertension Father   . Cancer Sister   . Alcohol abuse Brother   . Thyroid disease Neg Hx    History  Sexual Activity  . Sexual activity: Yes  . Partners: Female    Outpatient Encounter Prescriptions as of 12/03/2016  Medication Sig  . albuterol (PROVENTIL HFA;VENTOLIN HFA) 108 (90 Base) MCG/ACT inhaler Inhale 1-2 puffs into the lungs every 6 (six) hours as needed for wheezing or shortness of breath.  Marland Kitchen atenolol (TENORMIN) 25 MG tablet Take 1 tablet (25 mg total) by mouth daily. Annual appt w/labs due in August must see MD for refills  . atorvastatin (LIPITOR) 20 MG tablet Take 1 tablet (20 mg total) by mouth daily.  . cetirizine (ZYRTEC) 10 MG tablet Take 1 tablet (10 mg total) by mouth daily.  . clonazePAM (KLONOPIN) 0.5 MG tablet TAKE 1 TABLET BY MOUTH TWICE A DAY AS NEEDED FOR ANXIETY  . famotidine (PEPCID) 20 MG tablet TAKE 1 TABLET (20 MG TOTAL) BY MOUTH AT BEDTIME.  . finasteride (PROSCAR) 5 MG tablet TAKE 1 TABLET BY MOUTH EVERY DAY  . levocetirizine (XYZAL) 5 MG tablet Take 1 tablet (5 mg total) by mouth daily as needed for allergies.  Marland Kitchen losartan-hydrochlorothiazide (HYZAAR) 50-12.5 MG tablet Take 1 tablet by mouth daily.  Marland Kitchen losartan-hydrochlorothiazide (HYZAAR) 50-12.5 MG tablet TAKE 1 TABLET BY MOUTH EVERY DAY  . methimazole (  TAPAZOLE) 5 MG tablet TAKE 1 TABLET (5 MG TOTAL) BY MOUTH 3 (THREE) TIMES A WEEK.  . methimazole (TAPAZOLE) 5 MG tablet TAKE 1 TABLET (5 MG TOTAL) BY MOUTH 3 (THREE) TIMES A WEEK.  . pantoprazole (PROTONIX) 40 MG tablet Take 1 tablet (40 mg total) by mouth daily.  . sertraline (ZOLOFT) 100 MG tablet Take 1 tablet (100 mg total) by mouth daily.  . simvastatin (ZOCOR) 40 MG tablet TAKE 1 TABLET BY MOUTH EVERY DAY AT 6PM  . tiZANidine (ZANAFLEX) 4 MG tablet Take 1 tablet (4 mg total) by mouth every 6 (six) hours as needed for muscle spasms.   No facility-administered encounter medications on file as of 12/03/2016.     Activities of Daily  Living No flowsheet data found.  Patient Care Team: Biagio Borg, MD as PCP - General (Internal Medicine) Festus Aloe, MD as Attending Physician (Urology)   Assessment:    Physical assessment deferred to PCP.  Exercise Activities and Dietary recommendations   Diet (meal preparation, eat out, water intake, caffeinated beverages, dairy products, fruits and vegetables): {Desc; diets:16563} Breakfast: Lunch:  Dinner:      Goals    . Exercise 3x per week (30 min per time)          Wants to stay flexible; will start some home exercise      Fall Risk Fall Risk  12/03/2015 10/04/2014 10/04/2014  Falls in the past year? No No No   Depression Screen PHQ 2/9 Scores 12/03/2015 10/04/2014 10/04/2014 09/27/2013  PHQ - 2 Score 0 0 0 0    Cognitive Function        Immunization History  Administered Date(s) Administered  . Influenza Whole 02/16/2007, 02/27/2008  . Influenza, High Dose Seasonal PF 03/09/2016  . Influenza,inj,Quad PF,36+ Mos 05/17/2014  . Pneumococcal Conjugate-13 09/27/2013  . Pneumococcal Polysaccharide-23 02/27/2008, 10/04/2014  . Td 02/16/2007   Screening Tests Health Maintenance  Topic Date Due  . INFLUENZA VACCINE  11/25/2016  . TETANUS/TDAP  02/15/2017  . COLONOSCOPY  06/13/2020  . Hepatitis C Screening  Completed  . PNA vac Low Risk Adult  Completed      Plan:     I have personally reviewed and noted the following in the patient's chart:   . Medical and social history . Use of alcohol, tobacco or illicit drugs  . Current medications and supplements . Functional ability and status . Nutritional status . Physical activity . Advanced directives . List of other physicians . Vitals . Screenings to include cognitive, depression, and falls . Referrals and appointments  In addition, I have reviewed and discussed with patient certain preventive protocols, quality metrics, and best practice recommendations. A written personalized care plan for preventive  services as well as general preventive health recommendations were provided to patient.     Michiel Cowboy, RN  12/03/2016

## 2016-12-03 NOTE — Patient Instructions (Addendum)

## 2016-12-13 ENCOUNTER — Other Ambulatory Visit: Payer: Self-pay | Admitting: Internal Medicine

## 2016-12-17 ENCOUNTER — Other Ambulatory Visit: Payer: Self-pay | Admitting: Internal Medicine

## 2016-12-21 ENCOUNTER — Other Ambulatory Visit: Payer: Self-pay | Admitting: Internal Medicine

## 2016-12-29 ENCOUNTER — Other Ambulatory Visit: Payer: Self-pay | Admitting: Internal Medicine

## 2016-12-29 ENCOUNTER — Ambulatory Visit: Payer: Medicare Other | Admitting: Endocrinology

## 2017-01-05 ENCOUNTER — Other Ambulatory Visit: Payer: Self-pay | Admitting: Internal Medicine

## 2017-01-21 ENCOUNTER — Other Ambulatory Visit: Payer: Self-pay

## 2017-01-21 NOTE — Patient Outreach (Signed)
[facility]) Care Management  01/21/2017  LOUIS IVERY 1946-03-10 [id number]   Medication Adherence call to Mr. Danyal Whitenack the reason for this call is because Mr. Oyama is showing past due under [facility] Ins.on his Simvastatin 40 mg call patient he did not answer, [facility] said patient pick up this medication  on July 26 for a 90 days supply and  he wont be due until October 26.    [place] Management Direct Dial [phone]  Fax [phone] [email or web address]

## 2017-01-23 ENCOUNTER — Other Ambulatory Visit: Payer: Self-pay | Admitting: Internal Medicine

## 2017-01-27 ENCOUNTER — Other Ambulatory Visit: Payer: Self-pay | Admitting: Internal Medicine

## 2017-01-27 NOTE — Telephone Encounter (Signed)
Done Done hardcopy to [employer]

## 2017-01-27 NOTE — Telephone Encounter (Signed)
Faxed

## 2017-02-05 ENCOUNTER — Other Ambulatory Visit: Payer: Self-pay | Admitting: Endocrinology

## 2017-02-15 DIAGNOSIS — R972 Elevated prostate specific antigen [PSA]: Secondary | ICD-10-CM | POA: Diagnosis not present

## 2017-02-20 ENCOUNTER — Other Ambulatory Visit: Payer: Self-pay | Admitting: Internal Medicine

## 2017-02-21 ENCOUNTER — Other Ambulatory Visit: Payer: Self-pay | Admitting: Internal Medicine

## 2017-02-24 DIAGNOSIS — R972 Elevated prostate specific antigen [PSA]: Secondary | ICD-10-CM | POA: Diagnosis not present

## 2017-02-24 DIAGNOSIS — N400 Enlarged prostate without lower urinary tract symptoms: Secondary | ICD-10-CM | POA: Diagnosis not present

## 2017-03-26 IMAGING — CT CT ANGIO CHEST
1 of 8 series · 17 of 36 positions shown · IV contrast (Iohexol (Omnipaque 350))
Comparison: Chest radiographs 2846 hours today, and earlier. CT
Abdomen and Pelvis 03/30/2015.

CLINICAL DATA: 69-year-old male with shortness of breath and
abnormal D-dimer. Initial encounter.

EXAM:
CT ANGIOGRAPHY CHEST WITH CONTRAST
TECHNIQUE: Multidetector CT imaging of the chest was performed using the
standard protocol during bolus administration of intravenous
contrast. Multiplanar CT image reconstructions and MIPs were
obtained to evaluate the vascular anatomy.
CONTRAST:  80mL OMNIPAQUE IOHEXOL 350 MG/ML SOLN

[Series 506: thins pacs · axial · 0.65mm/px · z∈[+4,+270]mm · 17 of 300 slices shown]
[im 17/300  lung]
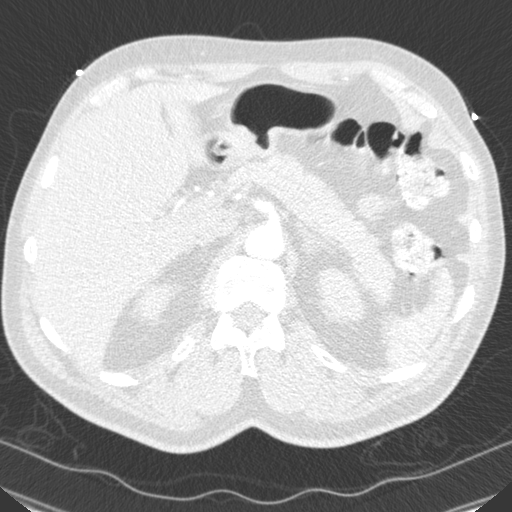
[im 34/300  mediastinal]
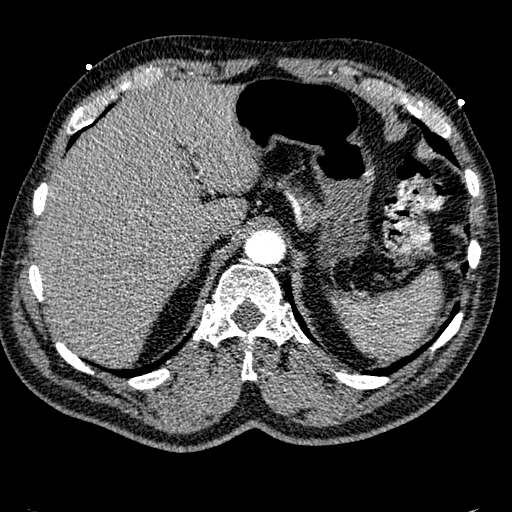
[im 50/300  lung]
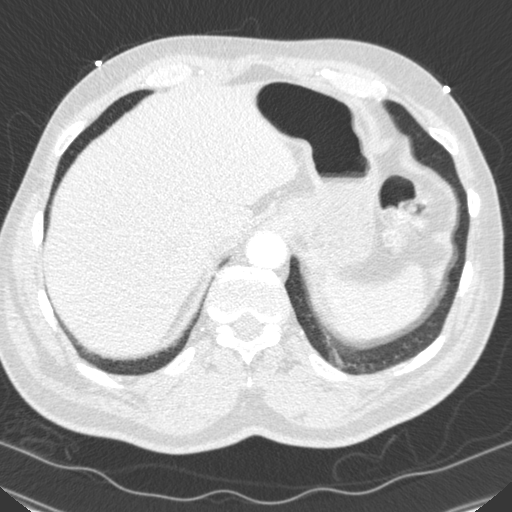
[im 67/300  mediastinal]
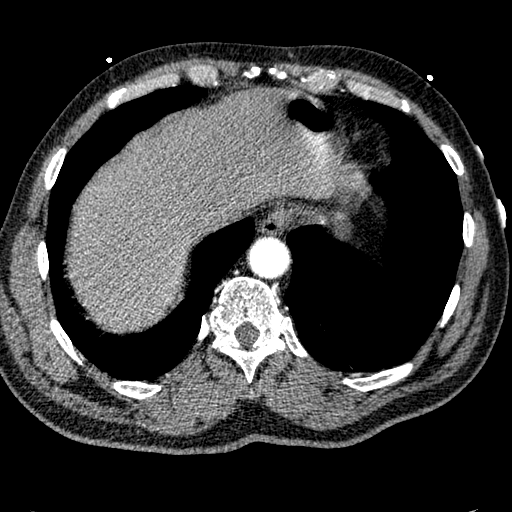
[im 84/300  lung]
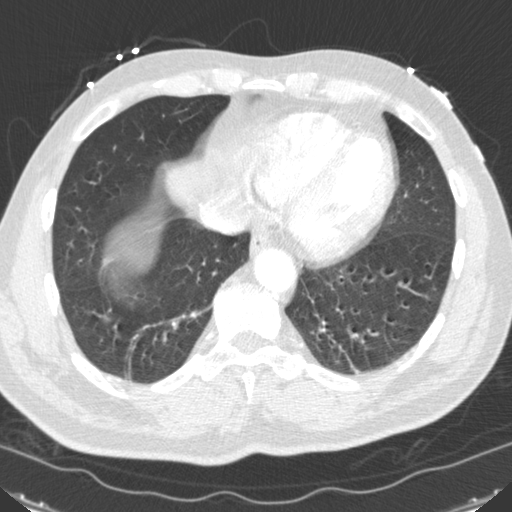
[im 100/300  mediastinal]
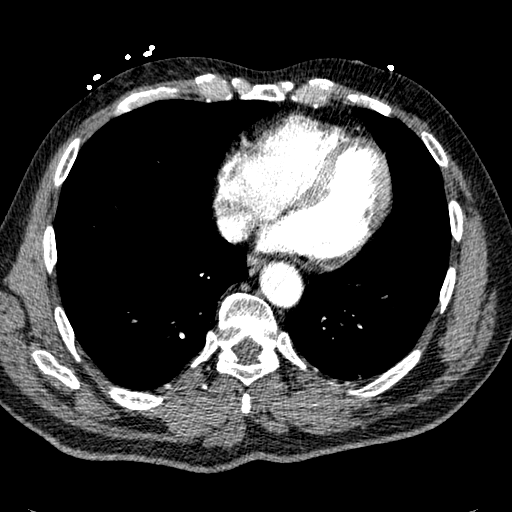
[im 117/300  lung]
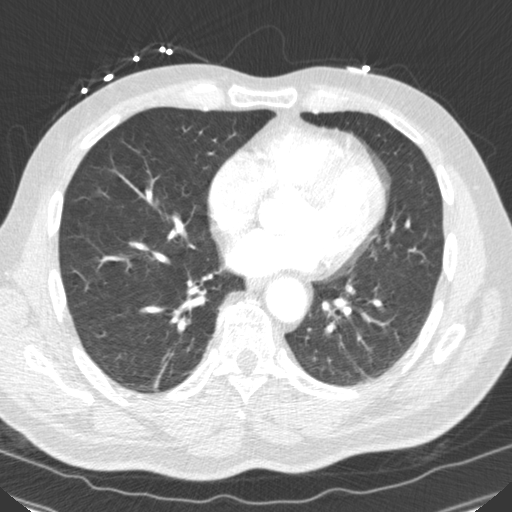
[im 133/300  mediastinal]
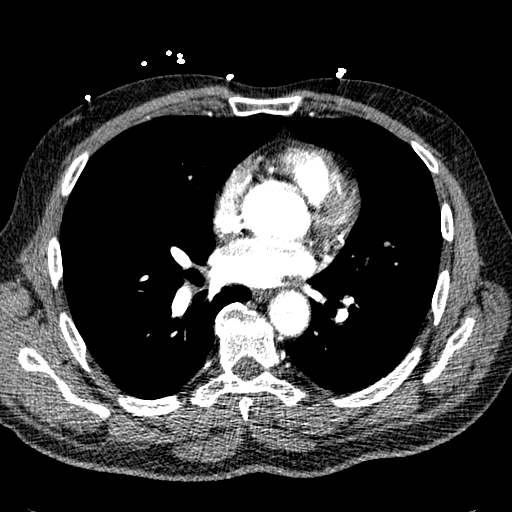
[im 150/300  lung]
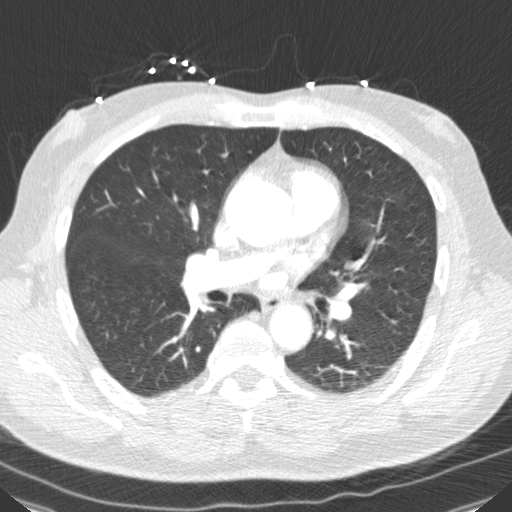
[im 167/300  mediastinal]
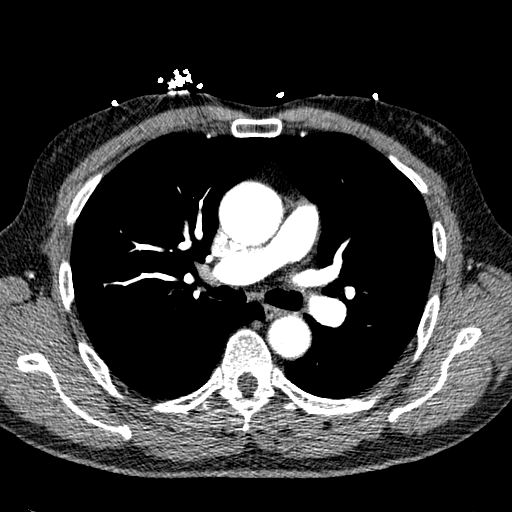
[im 183/300  lung]
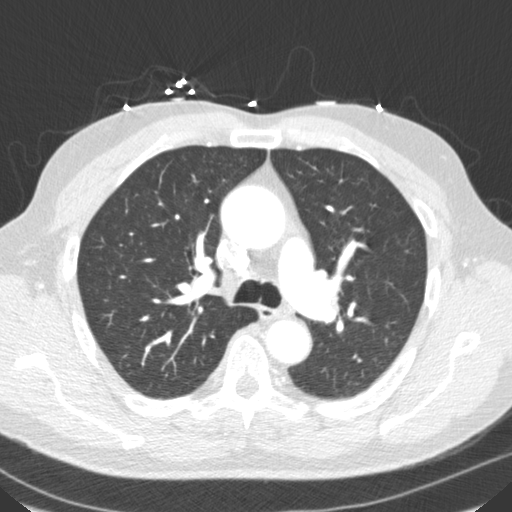
[im 200/300  mediastinal]
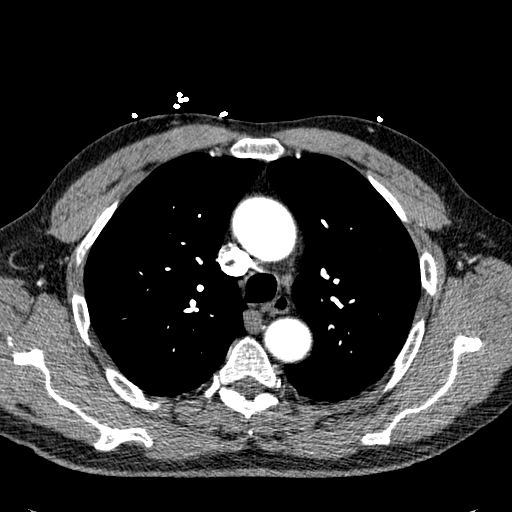
[im 216/300  lung]
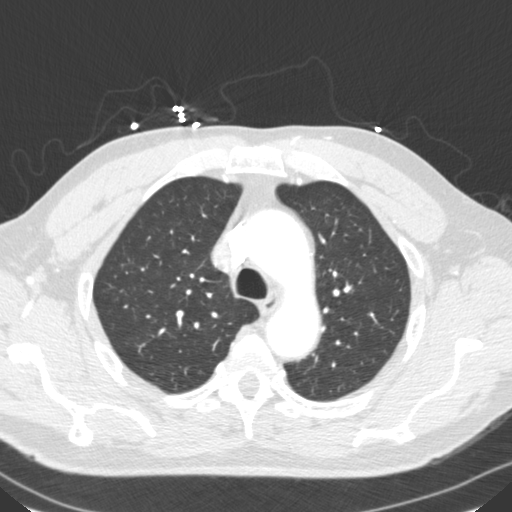
[im 233/300  mediastinal]
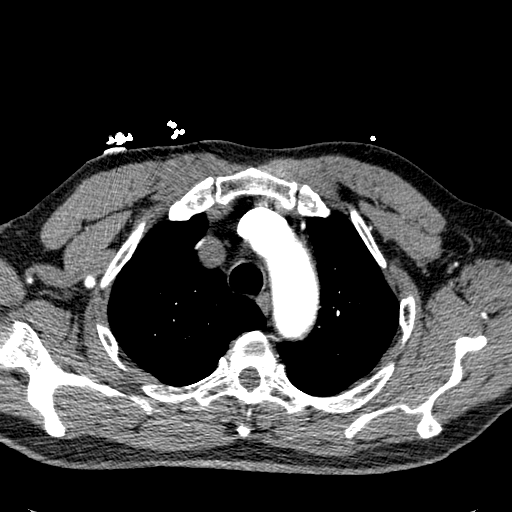
[im 250/300  lung]
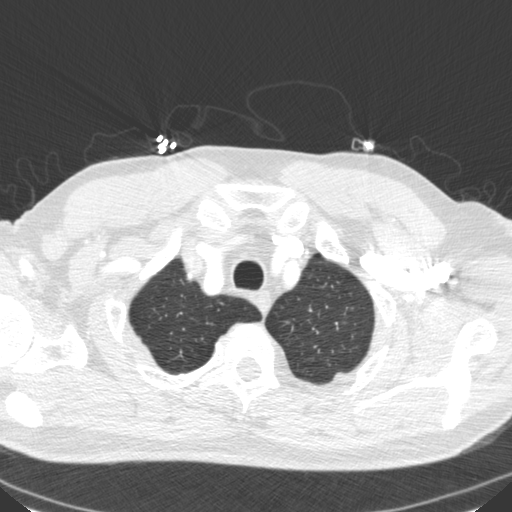
[im 266/300  mediastinal]
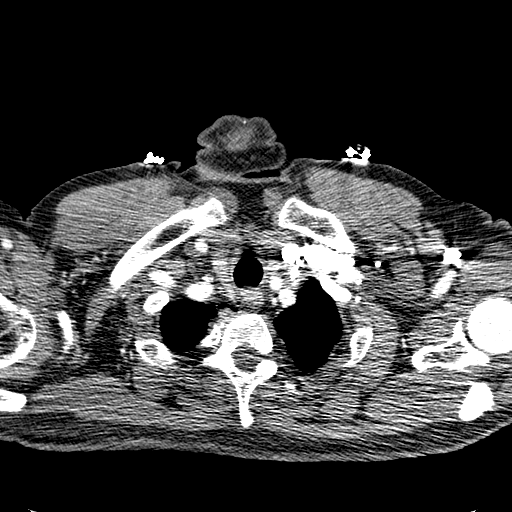
[im 283/300  lung]
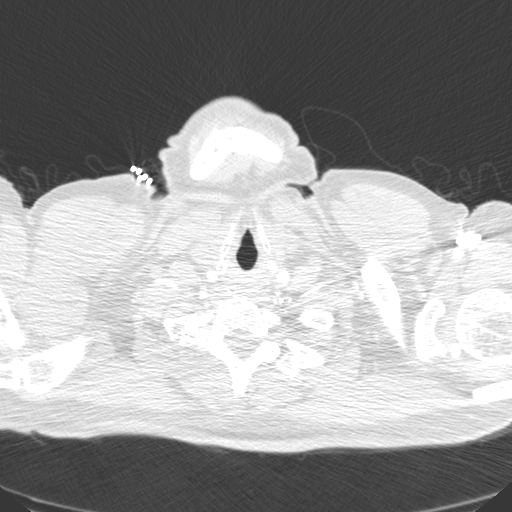

[17 of 36 positions shown; findings below may reference images not displayed]

FINDINGS: Good contrast bolus timing in the pulmonary arterial tree. Mild
lower lobe respiratory motion artifact. No focal filling defect
identified in the pulmonary arterial tree to suggest the presence of
acute pulmonary embolism.

Major airways are patent. Linear atelectasis or scarring in both
lower lobes is unchanged from the recent CT Abdomen and Pelvis. No
pleural effusion. No other abnormal pulmonary opacity.

No pericardial effusion. Negative thoracic inlet. Mild fusiform
enlargement of the ascending aorta up to 40-43 mm diameter. The
aorta tapers in the arch to 30 mm. No significant thoracic aortic
atherosclerosis. Negative great vessel origins. Negative descending
thoracic aorta. No definite coronary artery calcification. No
mediastinal or hilar lymphadenopathy. Negative visualized abdominal
aorta.

No axillary lymphadenopathy. Stable and negative visualized upper
abdomen.

No acute osseous abnormality identified.

Review of the MIP images confirms the above findings.
IMPRESSION: 1.  No evidence of acute pulmonary embolus.
2. Mild fusiform enlargement of the ascending aorta, 40-43 mm
diameter. No significant aortic atherosclerosis. Recommend annual
imaging followup by CTA or MRA. This recommendation follows 7272
ACCF/AHA/AATS/ACR/ASA/SCA/TARLA/GADIES/BOKANG BIBO/AGONSI Guidelines for the
Diagnosis and Management of Patients with Thoracic Aortic Disease.
Circulation. 7272; 121: [id number]
3. Negative lungs aside from stable mild linear scarring or
atelectasis in the lower lobes.

## 2017-03-26 IMAGING — DX DG CHEST 2V
2 series · 2 of 2 positions shown · non-contrast
Comparison: Prior radiograph from 03/26/2015.

CLINICAL DATA: Initial evaluation for acute shortness of breath.

EXAM:
CHEST  2 VIEW

[chest pa]
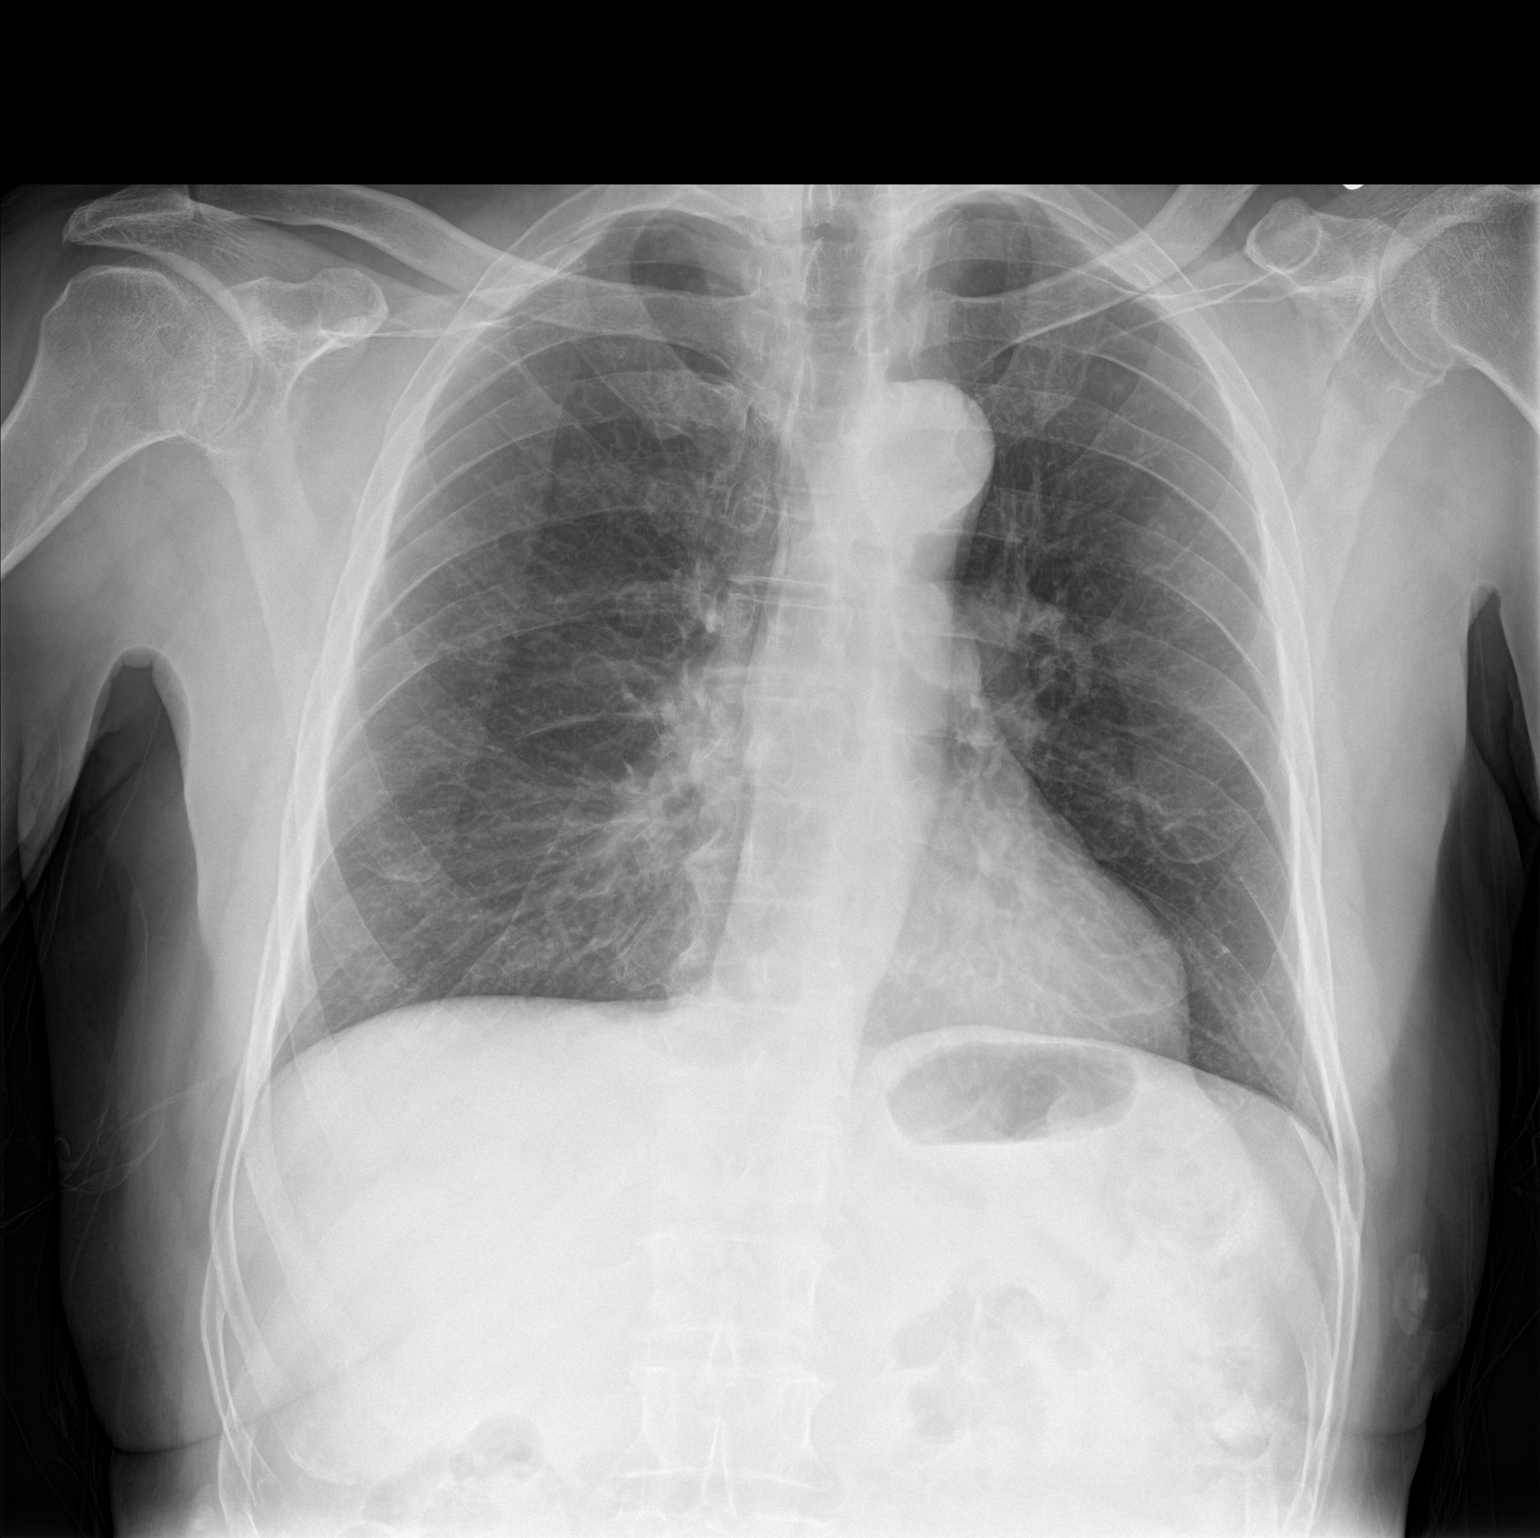

[chest lat]
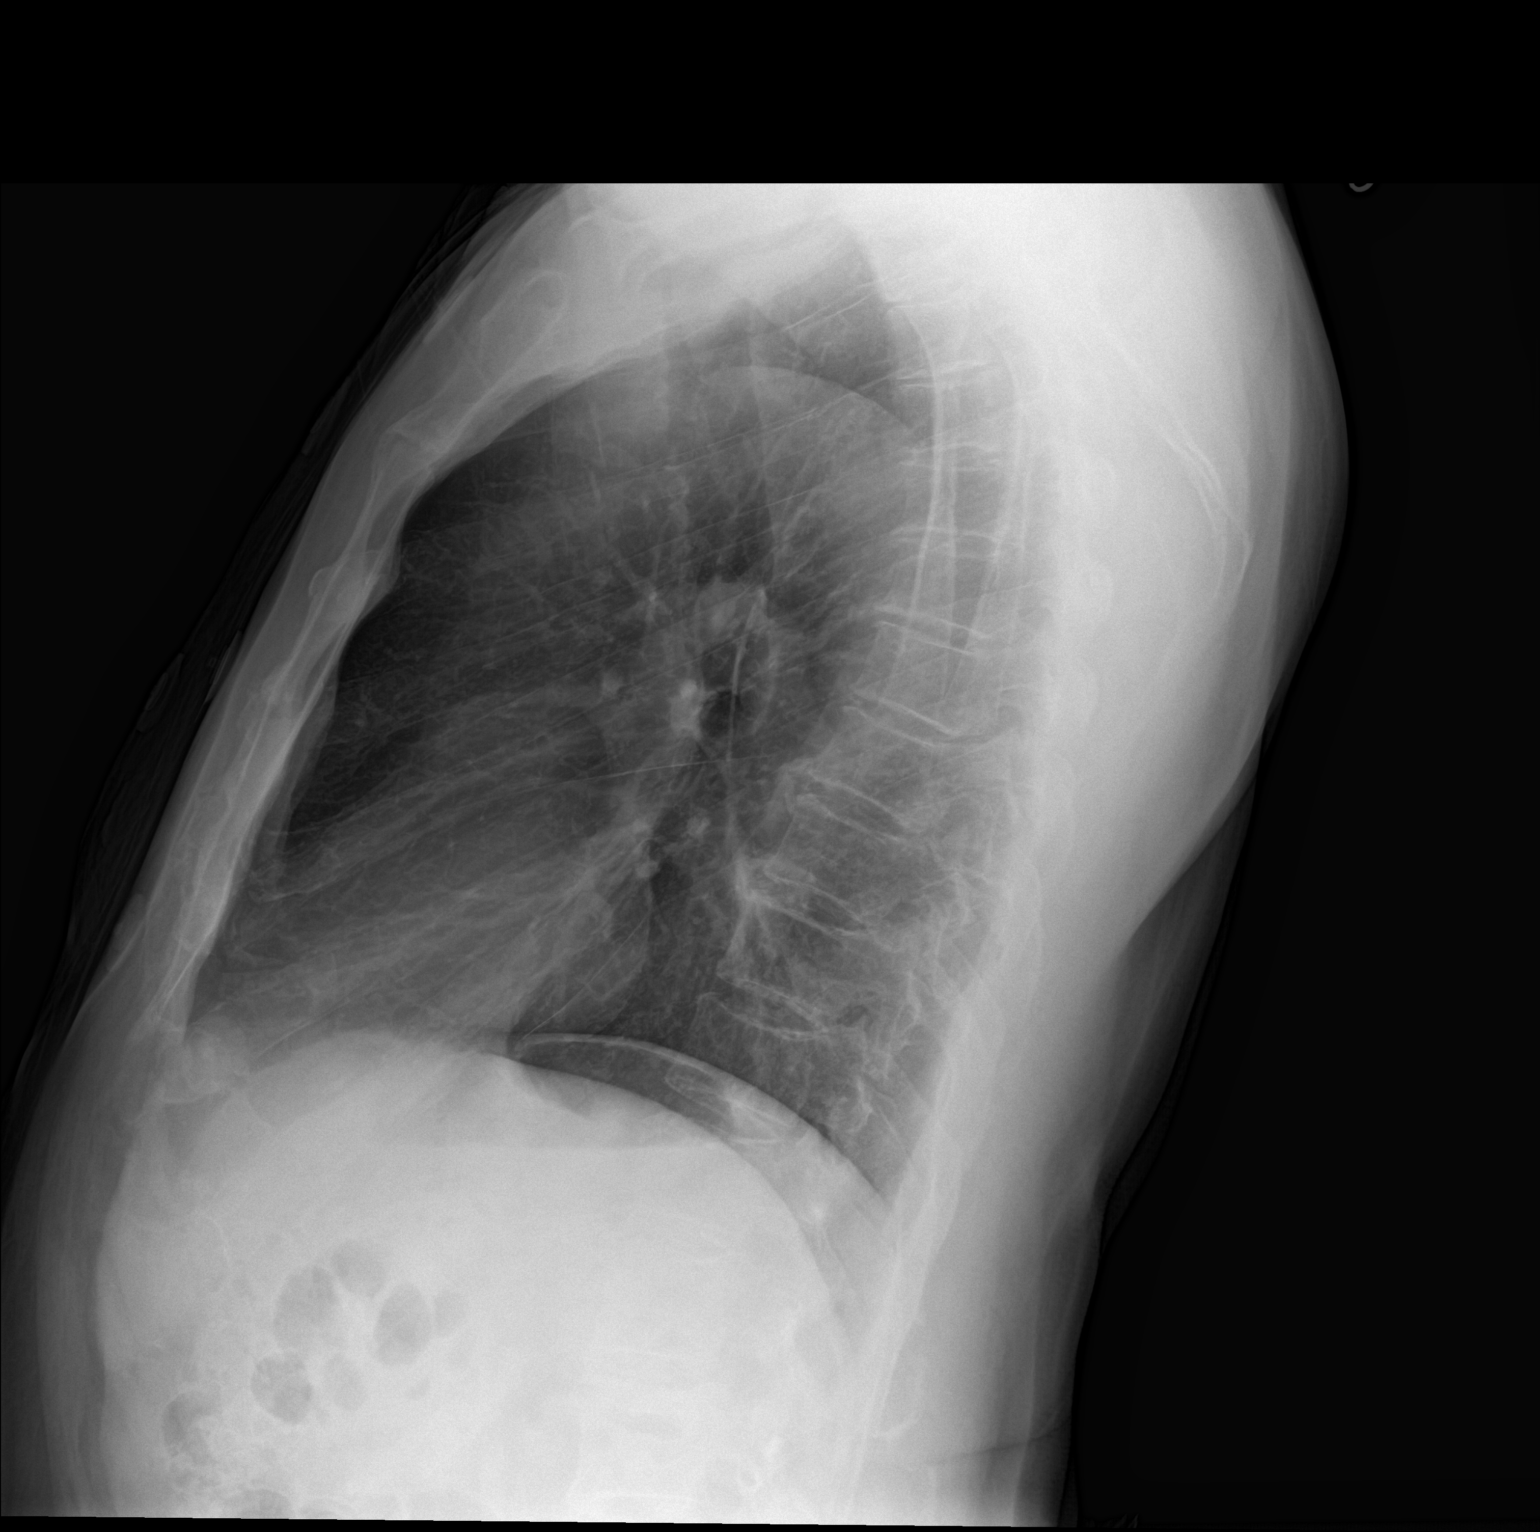

[2 of 2 positions shown; findings below may reference images not displayed]

FINDINGS: The cardiac and mediastinal silhouettes are stable in size and
contour, and remain within normal limits.

The lungs are normally inflated. No airspace consolidation, pleural
effusion, or pulmonary edema is identified. There is no
pneumothorax.

No acute osseous abnormality identified.
IMPRESSION: No active cardiopulmonary disease.

## 2017-03-29 ENCOUNTER — Other Ambulatory Visit: Payer: Self-pay | Admitting: Internal Medicine

## 2017-03-29 NOTE — Telephone Encounter (Signed)
Done erx 

## 2017-04-22 ENCOUNTER — Other Ambulatory Visit: Payer: Self-pay | Admitting: Internal Medicine

## 2017-04-29 ENCOUNTER — Ambulatory Visit (INDEPENDENT_AMBULATORY_CARE_PROVIDER_SITE_OTHER)
Admission: RE | Admit: 2017-04-29 | Discharge: 2017-04-29 | Disposition: A | Discharge status: Home / Self Care | Payer: Medicare Other | Source: Ambulatory Visit | Attending: Internal Medicine | Admitting: Internal Medicine

## 2017-04-29 ENCOUNTER — Encounter: Payer: Self-pay | Admitting: Internal Medicine

## 2017-04-29 ENCOUNTER — Ambulatory Visit (INDEPENDENT_AMBULATORY_CARE_PROVIDER_SITE_OTHER): Payer: Medicare Other | Admitting: Internal Medicine

## 2017-04-29 VITALS — BP 136/88 | HR 65 | Temp 98.1°F | Ht 64.0 in | Wt 204.0 lb

## 2017-04-29 DIAGNOSIS — I10 Essential (primary) hypertension: Secondary | ICD-10-CM | POA: Diagnosis not present

## 2017-04-29 DIAGNOSIS — R062 Wheezing: Secondary | ICD-10-CM | POA: Diagnosis not present

## 2017-04-29 DIAGNOSIS — R059 Cough, unspecified: Secondary | ICD-10-CM

## 2017-04-29 DIAGNOSIS — R05 Cough: Secondary | ICD-10-CM

## 2017-04-29 DIAGNOSIS — R739 Hyperglycemia, unspecified: Secondary | ICD-10-CM | POA: Diagnosis not present

## 2017-04-29 MED ORDER — PREDNISONE 10 MG PO TABS: ORAL_TABLET | ORAL | 0 refills | Status: DC

## 2017-04-29 MED ORDER — HYDROCODONE-HOMATROPINE 5-1.5 MG/5ML PO SYRP: 5.0000 mL | ORAL_SOLUTION | Freq: Four times a day (QID) | ORAL | 0 refills | Status: AC | PRN

## 2017-04-29 MED ORDER — HYDROCODONE-HOMATROPINE 5-1.5 MG/5ML PO SYRP: 5.0000 mL | ORAL_SOLUTION | Freq: Four times a day (QID) | ORAL | 0 refills | Status: DC | PRN

## 2017-04-29 MED ORDER — LEVOFLOXACIN 500 MG PO TABS: 500.0000 mg | ORAL_TABLET | Freq: Every day | ORAL | 0 refills | Status: AC

## 2017-04-29 NOTE — Assessment & Plan Note (Signed)
stable overall by history and exam, recent data reviewed with pt, and pt to continue medical treatment as before,  to f/u any worsening symptoms or concerns Lab Results  Component Value Date   HGBA1C 5.8 12/03/2016

## 2017-04-29 NOTE — Assessment & Plan Note (Signed)
Mild, for add predpac asd,  to f/u any worsening symptoms or concerns

## 2017-04-29 NOTE — Addendum Note (Signed)
Addended by: Biagio Borg on: 04/29/2017 03:38 PM   Modules accepted: Orders

## 2017-04-29 NOTE — Patient Instructions (Signed)
Please take all new medication as prescribed - the antibiotic, cough medicine as needed, and prednisone   Please continue all other medications as before, and refills have been done if requested.  Please have the pharmacy call with any other refills you may need.  Please keep your appointments with your specialists as you may have planned  Please go to the XRAY Department in the Basement (go straight as you get off the elevator) for the x-ray testing  You will be contacted by phone if any changes need to be made immediately.  Otherwise, you will receive a letter about your results with an explanation, but please check with MyChart first.  Please remember to sign up for MyChart if you have not done so, as this will be important to you in the future with finding out test results, communicating by private email, and scheduling acute appointments online when needed. 

## 2017-04-29 NOTE — Assessment & Plan Note (Signed)
stable overall by history and exam, recent data reviewed with pt, and pt to continue medical treatment as before,  to f/u any worsening symptoms or concerns BP Readings from Last 3 Encounters:  04/29/17 136/88  12/03/16 134/88  08/28/16 132/86

## 2017-04-29 NOTE — Assessment & Plan Note (Signed)
Mild to mod, c/w bronchitis vs pna, for cxr, also for antibx course, cough med prn,  to f/u any worsening symptoms or concerns

## 2017-04-29 NOTE — Progress Notes (Signed)
Subjective:    Patient ID: Jack [name], male    DOB: [birth year], 72 y.o.   MRN: [id number]  HPI  Here with acute onset mild to mod 2 wks ill with  ST, HA, general weakness and malaise, with prod cough greenish sputum, but Pt denies chest pain, increased sob or doe, wheezing, orthopnea, PND, increased LE swelling, palpitations, dizziness or syncope, except for onset mild worsening dyspnea and wheezing for last several days. Pt denies new neurological symptoms such as new headache, or facial or extremity weakness or numbness   Pt denies polydipsia, polyuria Past Medical History:  Diagnosis Date  . ALLERGIC RHINITIS 02/16/2007  . 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   . BACK PAIN 02/16/2007  . COPD (chronic obstructive pulmonary disease) ([place])   . Diverticulosis 09/27/2013  . FOOT PAIN, CHRONIC 02/27/2008  . Hepatitis C antibody test positive   . 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 HYPERLIPIDEMIA 02/16/2007  . HYPERTENSION 02/16/2007  . Personal history of colonic polyps 03/08/2007  . PROSTATE SPECIFIC ANTIGEN, ELEVATED 02/16/2007  . Shortness of breath dyspnea    Past Surgical History:  Procedure Laterality Date  . FOOT SURGERY     reconstruction x's 4 after work accident    reports that he quit smoking about 33 years ago. His smoking use included cigarettes. He has a 7.50 pack-year smoking history. he has never used smokeless tobacco. He reports that he does not drink alcohol or use drugs. family history includes Alcohol abuse in his brother; Cancer in his mother and sister; Coronary artery disease in his father; Hypertension in his father. No Known Allergies Current Outpatient Medications on File Prior to Visit  Medication Sig Dispense Refill  . albuterol (PROVENTIL HFA;VENTOLIN HFA) 108 (90 Base) MCG/ACT inhaler Inhale 1-2 puffs into the lungs every 6 (six) hours as needed for wheezing or shortness of breath. 54 g 0  . atenolol (TENORMIN) 25 MG tablet TAKE 1 TABLET (25 MG TOTAL) BY MOUTH DAILY.  ANNUAL APPT W/LABS DUE IN AUGUST MUST SEE MD FOR REFILLS 90 tablet 0  . cetirizine (ZYRTEC) 10 MG tablet TAKE 1 TABLET (10 MG TOTAL) BY MOUTH DAILY. 90 tablet 1  . clonazePAM (KLONOPIN) 0.5 MG tablet TAKE 1 TABLET BY MOUTH TWICE A DAY AS NEEDED FOR ANXIETY 60 tablet 2  . famotidine (PEPCID) 20 MG tablet TAKE 1 TABLET BY MOUTH EVERYDAY AT BEDTIME 90 tablet 3  . finasteride (PROSCAR) 5 MG tablet TAKE 1 TABLET BY MOUTH EVERY DAY 90 tablet 3  . levocetirizine (XYZAL) 5 MG tablet Take 1 tablet (5 mg total) by mouth daily as needed for allergies. 90 tablet 1  . losartan-hydrochlorothiazide (HYZAAR) 50-12.5 MG tablet Take 1 tablet by mouth daily. 90 tablet 3  . methimazole (TAPAZOLE) 5 MG tablet TAKE 1 TABLET (5 MG TOTAL) BY MOUTH 3 (THREE) TIMES A WEEK. 12 tablet 3  . pantoprazole (PROTONIX) 40 MG tablet Take 1 tablet (40 mg total) by mouth daily. 90 tablet 1  . sertraline (ZOLOFT) 100 MG tablet Take 1 tablet (100 mg total) by mouth daily. 90 tablet 1  . simvastatin (ZOCOR) 40 MG tablet TAKE 1 TABLET BY MOUTH EVERY DAY AT 6PM 90 tablet 2  . tiZANidine (ZANAFLEX) 4 MG tablet Take 1 tablet (4 mg total) by mouth every 6 (six) hours as needed for muscle spasms. 40 tablet 1  . atorvastatin (LIPITOR) 20 MG tablet Take 1 tablet (20 mg total) by mouth daily. 90 tablet 3   No  current facility-administered medications on file prior to visit.    Review of Systems  Constitutional: Negative for other unusual diaphoresis or sweats HENT: Negative for ear discharge or swelling Eyes: Negative for other worsening visual disturbances Respiratory: Negative for stridor or other swelling  Gastrointestinal: Negative for worsening distension or other blood Genitourinary: Negative for retention or other urinary change Musculoskeletal: Negative for other MSK pain or swelling Skin: Negative for color change or other new lesions Neurological: Negative for worsening tremors and other numbness  Psychiatric/Behavioral:  Negative for worsening agitation or other fatigue All other system neg per pt    Objective:   Physical Exam BP 136/88   Pulse 65   Temp 98.1 F (36.7 C) (Oral)   Ht 5\' 4"  (1.626 m)   Wt 204 lb (92.5 kg)   SpO2 99%   BMI 35.02 kg/m  VS noted, mild ill appearing Constitutional: Pt appears in NAD HENT: Head: NCAT.  Right Ear: External ear normal.  Left Ear: External ear normal.  Eyes: . Pupils are equal, round, and reactive to light. Conjunctivae and EOM are normal Nose: without d/c or deformity Bilat tm's with mild erythema.  Max sinus areas non tender.  Pharynx with mild erythema, no exudate Neck: Neck supple. Gross normal ROM Cardiovascular: Normal rate and regular rhythm.   Pulmonary/Chest: Effort normal and breath sounds decresaed without rales but with diffuse mild wheezing insp and expir  Neurological: Pt is alert. At baseline orientation, motor grossly intact Skin: Skin is warm. No rashes, other new lesions, no LE edema Psychiatric: Pt behavior is normal without agitation  No other exam findings    Assessment & Plan:

## 2017-04-30 ENCOUNTER — Telehealth: Payer: Self-pay

## 2017-04-30 ENCOUNTER — Encounter: Payer: Self-pay | Admitting: Internal Medicine

## 2017-04-30 NOTE — Telephone Encounter (Signed)
Pt's wife has been informed and expressed understanding. 

## 2017-04-30 NOTE — Telephone Encounter (Signed)
-----   Message from Biagio Borg, MD sent at 04/30/2017  8:51 AM EST ----- Left message on MyChart, pt to cont same tx except  The test results show that your current treatment is OK, as although the chest xray does suggest possible pneumonia, you have been treated with antibiotic and other medication for cough and prednisone for wheezing.  Please continue this treatment, and return if not improving in 2-3 days    Jack [name] to please inform pt though you may wish to d/w wife due to patient low health literacy

## 2017-06-07 ENCOUNTER — Other Ambulatory Visit: Payer: Self-pay

## 2017-06-07 MED ORDER — METHIMAZOLE 5 MG PO TABS: 5.0000 mg | ORAL_TABLET | ORAL | 1 refills | Status: DC

## 2017-06-09 ENCOUNTER — Other Ambulatory Visit: Payer: Self-pay | Admitting: Endocrinology

## 2017-06-18 ENCOUNTER — Other Ambulatory Visit: Payer: Self-pay | Admitting: Internal Medicine

## 2017-06-18 NOTE — Telephone Encounter (Signed)
Klonopin  LOV 04/29/17  Dr. Jenny Reichmann  Rx. verified

## 2017-06-18 NOTE — Telephone Encounter (Signed)
Copied from [place]. Topic: Quick Communication - Rx Refill/Question >> Jun 18, 2017 11:49 AM Scherrie Gerlach wrote: Medication: clonazePAM (KLONOPIN) 0.5 MG tablet 90 day this time  Has the patient contacted their pharmacy? Yes. Pharmacy advised pt to call his doctor [id number] - [place], [place] - [address] [phone] (Phone) [phone] (Fax)

## 2017-06-18 NOTE — Telephone Encounter (Signed)
Done erx 

## 2017-06-25 ENCOUNTER — Other Ambulatory Visit: Payer: Self-pay | Admitting: Internal Medicine

## 2017-06-29 DIAGNOSIS — H25813 Combined forms of age-related cataract, bilateral: Secondary | ICD-10-CM | POA: Diagnosis not present

## 2017-06-29 DIAGNOSIS — H524 Presbyopia: Secondary | ICD-10-CM | POA: Diagnosis not present

## 2017-06-29 DIAGNOSIS — H52223 Regular astigmatism, bilateral: Secondary | ICD-10-CM | POA: Diagnosis not present

## 2017-07-01 ENCOUNTER — Other Ambulatory Visit: Payer: Self-pay | Admitting: Internal Medicine

## 2017-07-01 MED ORDER — CLONAZEPAM 0.5 MG PO TABS: ORAL_TABLET | ORAL | 2 refills | Status: DC

## 2017-07-04 ENCOUNTER — Other Ambulatory Visit: Payer: Self-pay | Admitting: Internal Medicine

## 2017-07-05 NOTE — Telephone Encounter (Signed)
No erx as klonopin already done mar 7

## 2017-07-06 ENCOUNTER — Other Ambulatory Visit: Payer: Self-pay | Admitting: Internal Medicine

## 2017-07-06 MED ORDER — CLONAZEPAM 0.5 MG PO TABS: ORAL_TABLET | ORAL | 2 refills | Status: DC

## 2017-07-06 NOTE — Telephone Encounter (Signed)
Done erx 

## 2017-07-18 ENCOUNTER — Other Ambulatory Visit: Payer: Self-pay | Admitting: Internal Medicine

## 2017-07-19 ENCOUNTER — Telehealth: Payer: Self-pay

## 2017-07-19 NOTE — Telephone Encounter (Signed)
Please advise.  Copied from [place] [id number]. Topic: General - Other >> Jul 19, 2017 10:41 AM Oneta Rack wrote: Caller name: Timouthy Gilardi  Relation to pt: spouse  Call back number: [phone]   Reason for call:  Requesting annual colonoscopy orders please place with Gatha Mayer, MD >> Jul 19, 2017 10:45 AM Oneta Rack wrote: [place] Human name: Odie Edmonds  Relation to pt: spouse  Call back number: [phone]   Reason for call:  Requesting annual colonoscopy orders please place with Gatha Mayer, MD >> Jul 19, 2017 11:00 AM Para Skeans A wrote: Does this need to go to GI?

## 2017-07-19 NOTE — Telephone Encounter (Signed)
I would need a particular reason, and maybe this is something we could handle, thanks

## 2017-07-19 NOTE — Telephone Encounter (Signed)
Pt has an appt already scheduled with Dr. Jenny Reichmann in August. Pt and wife stated they will discuss then.

## 2017-07-19 NOTE — Telephone Encounter (Signed)
Ok to let pt know, that his last colonoscopy was Feb 2017, at which time he was asked to return in 5 yrs, so he would not be due based on this  If he is having particular symptoms, maybe I can refer to Dr Carlean Purl for an office evaluation

## 2017-07-19 NOTE — Telephone Encounter (Signed)
Pt woula like a follow up appt. He is aware that he doesn't need another colonoscopy at this time but would like a follow up. Please advise.

## 2017-10-01 ENCOUNTER — Telehealth: Payer: Self-pay | Admitting: Internal Medicine

## 2017-10-01 DIAGNOSIS — N400 Enlarged prostate without lower urinary tract symptoms: Secondary | ICD-10-CM

## 2017-10-01 NOTE — Telephone Encounter (Signed)
Ok, this is done 

## 2017-10-01 NOTE — Addendum Note (Signed)
Addended by: Biagio Borg on: 10/01/2017 12:59 PM   Modules accepted: Orders

## 2017-10-01 NOTE — Telephone Encounter (Signed)
Pt has an appointment with [facility] on 6/11. He needs a referral to check his prostate.  Please advise

## 2017-10-05 DIAGNOSIS — R972 Elevated prostate specific antigen [PSA]: Secondary | ICD-10-CM | POA: Diagnosis not present

## 2017-10-05 DIAGNOSIS — N400 Enlarged prostate without lower urinary tract symptoms: Secondary | ICD-10-CM | POA: Diagnosis not present

## 2017-11-12 ENCOUNTER — Other Ambulatory Visit: Payer: Self-pay | Admitting: Internal Medicine

## 2017-11-14 ENCOUNTER — Other Ambulatory Visit: Payer: Self-pay | Admitting: Internal Medicine

## 2017-12-03 ENCOUNTER — Telehealth: Payer: Self-pay | Admitting: Internal Medicine

## 2017-12-03 NOTE — Telephone Encounter (Signed)
Copied from [place] [id number]. Topic: Quick Communication - Rx Refill/Question >> Dec 03, 2017 10:44 AM Selinda Flavin B, NT wrote: Medication: predniSONE (DELTASONE) 10 MG tablet   Has the patient contacted their pharmacy? Yes.   (Agent: If no, request that the patient contact the pharmacy for the refill.) (Agent: If yes, when and what did the pharmacy advise?)  Preferred Pharmacy (with phone number or street name): [id number] - [place], [address]  Agent: Please be advised that RX refills may take up to 3 business days. We ask that you follow-up with your pharmacy.

## 2017-12-03 NOTE — Telephone Encounter (Signed)
Pt's wife called and states that the pt only had one pill left in prescription bottle. Pt's wife advised that prescription was originally written in 04/2017 for a tapered dose amount and it should not be taken on a daily basis. Pt's wife verbalized understanding. No other concerns voiced at this time.

## 2017-12-06 ENCOUNTER — Other Ambulatory Visit (INDEPENDENT_AMBULATORY_CARE_PROVIDER_SITE_OTHER): Payer: Medicare Other

## 2017-12-06 ENCOUNTER — Ambulatory Visit (INDEPENDENT_AMBULATORY_CARE_PROVIDER_SITE_OTHER): Payer: Medicare Other | Admitting: Internal Medicine

## 2017-12-06 ENCOUNTER — Encounter: Payer: Self-pay | Admitting: Internal Medicine

## 2017-12-06 VITALS — BP 140/88 | HR 58 | Temp 97.8°F | Ht 64.0 in | Wt 199.0 lb

## 2017-12-06 DIAGNOSIS — I10 Essential (primary) hypertension: Secondary | ICD-10-CM

## 2017-12-06 DIAGNOSIS — E785 Hyperlipidemia, unspecified: Secondary | ICD-10-CM | POA: Diagnosis not present

## 2017-12-06 DIAGNOSIS — R739 Hyperglycemia, unspecified: Secondary | ICD-10-CM | POA: Diagnosis not present

## 2017-12-06 DIAGNOSIS — Z0000 Encounter for general adult medical examination without abnormal findings: Secondary | ICD-10-CM

## 2017-12-06 DIAGNOSIS — F419 Anxiety disorder, unspecified: Secondary | ICD-10-CM

## 2017-12-06 DIAGNOSIS — F329 Major depressive disorder, single episode, unspecified: Secondary | ICD-10-CM

## 2017-12-06 LAB — CBC WITH DIFFERENTIAL/PLATELET
BASOS ABS: 0 10*3/uL (ref 0.0–0.1)
Basophils Relative: 0.4 % (ref 0.0–3.0)
EOS ABS: 0.1 10*3/uL (ref 0.0–0.7)
Eosinophils Relative: 1.4 % (ref 0.0–5.0)
HEMATOCRIT: 40.8 % (ref 39.0–52.0)
HEMOGLOBIN: 13.9 g/dL (ref 13.0–17.0)
LYMPHS PCT: 32.6 % (ref 12.0–46.0)
Lymphs Abs: 3.1 10*3/uL (ref 0.7–4.0)
MCHC: 34.1 g/dL (ref 30.0–36.0)
MCV: 95.6 fl (ref 78.0–100.0)
Monocytes Absolute: 0.7 10*3/uL (ref 0.1–1.0)
Monocytes Relative: 7.3 % (ref 3.0–12.0)
NEUTROS ABS: 5.5 10*3/uL (ref 1.4–7.7)
Neutrophils Relative %: 58.3 % (ref 43.0–77.0)
PLATELETS: 204 10*3/uL (ref 150.0–400.0)
RBC: 4.27 Mil/uL (ref 4.22–5.81)
RDW: 13.5 % (ref 11.5–15.5)
WBC: 9.5 10*3/uL (ref 4.0–10.5)

## 2017-12-06 LAB — URINALYSIS, ROUTINE W REFLEX MICROSCOPIC
Bilirubin Urine: NEGATIVE
HGB URINE DIPSTICK: NEGATIVE
Ketones, ur: NEGATIVE
LEUKOCYTES UA: NEGATIVE
Nitrite: NEGATIVE
Specific Gravity, Urine: >=1.03 — AB (ref 1.000–1.030)
Total Protein, Urine: NEGATIVE
UROBILINOGEN UA: 0.2 (ref 0.0–1.0)
Urine Glucose: NEGATIVE
pH: 5.5 (ref 5.0–8.0)

## 2017-12-06 LAB — LIPID PANEL
CHOL/HDL RATIO: 4
CHOLESTEROL: 186 mg/dL (ref 0–200)
HDL: 46.9 mg/dL (ref >39.00)
NONHDL: 139.18
Triglycerides: 292 mg/dL — ABNORMAL HIGH (ref 0.0–149.0)
VLDL: 58.4 mg/dL — ABNORMAL HIGH (ref 0.0–40.0)

## 2017-12-06 LAB — BASIC METABOLIC PANEL
BUN: 14 mg/dL (ref 6–23)
CHLORIDE: 105 meq/L (ref 96–112)
CO2: 29 mEq/L (ref 19–32)
CREATININE: 1.3 mg/dL (ref 0.40–1.50)
Calcium: 9.8 mg/dL (ref 8.4–10.5)
GFR: 69.74 mL/min (ref >60.00)
Glucose, Bld: 110 mg/dL — ABNORMAL HIGH (ref 70–99)
Potassium: 3.6 mEq/L (ref 3.5–5.1)
Sodium: 141 mEq/L (ref 135–145)

## 2017-12-06 LAB — TSH: TSH: 0.36 u[IU]/mL (ref 0.35–4.50)

## 2017-12-06 LAB — HEPATIC FUNCTION PANEL
ALT: 8 U/L (ref 0–53)
AST: 19 U/L (ref 0–37)
Albumin: 4.2 g/dL (ref 3.5–5.2)
Alkaline Phosphatase: 87 U/L (ref 39–117)
BILIRUBIN DIRECT: 0.1 mg/dL (ref 0.0–0.3)
BILIRUBIN TOTAL: 0.7 mg/dL (ref 0.2–1.2)
TOTAL PROTEIN: 7.6 g/dL (ref 6.0–8.3)

## 2017-12-06 LAB — PSA: PSA: 2.54 ng/mL (ref 0.10–4.00)

## 2017-12-06 LAB — LDL CHOLESTEROL, DIRECT: Direct LDL: 89 mg/dL

## 2017-12-06 MED ORDER — ALBUTEROL SULFATE HFA 108 (90 BASE) MCG/ACT IN AERS: 1.0000 | INHALATION_SPRAY | Freq: Four times a day (QID) | RESPIRATORY_TRACT | 11 refills | Status: DC | PRN

## 2017-12-06 MED ORDER — ROSUVASTATIN CALCIUM 40 MG PO TABS: 40.0000 mg | ORAL_TABLET | Freq: Every day | ORAL | 3 refills | Status: DC

## 2017-12-06 MED ORDER — PANTOPRAZOLE SODIUM 40 MG PO TBEC: 40.0000 mg | DELAYED_RELEASE_TABLET | Freq: Every day | ORAL | 3 refills | Status: DC

## 2017-12-06 MED ORDER — TIZANIDINE HCL 4 MG PO TABS: 4.0000 mg | ORAL_TABLET | Freq: Four times a day (QID) | ORAL | 1 refills | Status: DC | PRN

## 2017-12-06 MED ORDER — FAMOTIDINE 20 MG PO TABS: ORAL_TABLET | ORAL | 3 refills | Status: DC

## 2017-12-06 MED ORDER — FINASTERIDE 5 MG PO TABS: 5.0000 mg | ORAL_TABLET | Freq: Every day | ORAL | 3 refills | Status: DC

## 2017-12-06 MED ORDER — ZOSTER VAC RECOMB ADJUVANTED 50 MCG/0.5ML IM SUSR: 0.5000 mL | Freq: Once | INTRAMUSCULAR | 1 refills | Status: AC

## 2017-12-06 MED ORDER — CETIRIZINE HCL 10 MG PO TABS: 10.0000 mg | ORAL_TABLET | Freq: Every day | ORAL | 3 refills | Status: DC

## 2017-12-06 MED ORDER — ATENOLOL 25 MG PO TABS: 25.0000 mg | ORAL_TABLET | Freq: Every day | ORAL | 3 refills | Status: AC

## 2017-12-06 NOTE — Assessment & Plan Note (Signed)
No longer needs zoloft, will try to continue off for now

## 2017-12-06 NOTE — Assessment & Plan Note (Signed)
stable overall by history and exam, recent data reviewed with pt, and pt to continue medical treatment as before,  to f/u any worsening symptoms or concerns for f/u a1c

## 2017-12-06 NOTE — Patient Instructions (Addendum)
You had the shingles shot prescription sent to the pharmacy  Ok to change the cholesterol medication to generic crestor 40 mg per day  Please continue all other medications as before, and refills have been done if requested.  Please have the pharmacy call with any other refills you may need.  Please continue your efforts at being more active, low cholesterol diet, and weight control.  You are otherwise up to date with prevention measures today.  Please keep your appointments with your specialists as you may have planned  Please go to the LAB in the Basement (turn left off the elevator) for the tests to be done today  You will be contacted by phone if any changes need to be made immediately.  Otherwise, you will receive a letter about your results with an explanation, but please check with MyChart first.  You will be contacted by phone if any changes need to be made immediately.  Otherwise, you will receive a letter about your results with an explanation, but please check with MyChart first.  Please return in 6 months, or sooner if needed

## 2017-12-06 NOTE — Assessment & Plan Note (Signed)
Ok for change zocor to crestor 40,  to f/u any worsening symptoms or concerns

## 2017-12-06 NOTE — Assessment & Plan Note (Signed)
Ok to restart med - done erx

## 2017-12-06 NOTE — Progress Notes (Signed)
Subjective:    Patient ID: Jack [name], male    DOB: 02-15-1946, 72 y.o.   MRN: [id number]  HPI  Here for wellness and f/u;  Overall doing ok;  Pt denies Chest pain, worsening SOB, DOE, wheezing, orthopnea, PND, worsening LE edema, palpitations, dizziness or syncope.  Pt denies neurological change such as new headache, facial or extremity weakness.  Pt denies polydipsia, polyuria, or low sugar symptoms. Pt states overall good compliance with treatment and medications, good tolerability, and has been trying to follow appropriate diet.  Pt denies worsening depressive symptoms, suicidal ideation or panic. No fever, night sweats, wt loss, loss of appetite, or other constitutional symptoms.  Pt states good ability with ADL's, has low fall risk, home safety reviewed and adequate, no other significant changes in hearing or vision, and only occasionally active with exercise. Not taking the losartan HCT, ran out.  No new complaints Past Medical History:  Diagnosis Date  . ALLERGIC RHINITIS 02/16/2007  . 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   . BACK PAIN 02/16/2007  . COPD (chronic obstructive pulmonary disease) ([place])   . Diverticulosis 09/27/2013  . FOOT PAIN, CHRONIC 02/27/2008  . Hepatitis C antibody test positive   . 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 HYPERLIPIDEMIA 02/16/2007  . HYPERTENSION 02/16/2007  . Personal history of colonic polyps 03/08/2007  . PROSTATE SPECIFIC ANTIGEN, ELEVATED 02/16/2007  . Shortness of breath dyspnea    Past Surgical History:  Procedure Laterality Date  . FOOT SURGERY     reconstruction x's 4 after work accident    reports that he quit smoking about 33 years ago. His smoking use included cigarettes. He has a 7.50 pack-year smoking history. He has never used smokeless tobacco. He reports that he does not drink alcohol or use drugs. family history includes Alcohol abuse in his brother; Cancer in his mother and sister; Coronary artery disease in his father; Hypertension in his  father. No Known Allergies Current Outpatient Medications on File Prior to Visit  Medication Sig Dispense Refill  . clonazePAM (KLONOPIN) 0.5 MG tablet TAKE 1 TABLET BY MOUTH TWICE A DAY AS NEEDED FOR ANXIETY 60 tablet 2  . levocetirizine (XYZAL) 5 MG tablet Take 1 tablet (5 mg total) by mouth daily as needed for allergies. 90 tablet 1  . losartan-hydrochlorothiazide (HYZAAR) 50-12.5 MG tablet Take 1 tablet by mouth daily. 90 tablet 3  . methimazole (TAPAZOLE) 5 MG tablet Take 1 tablet (5 mg total) by mouth 3 (three) times a week. 12 tablet 1  . methimazole (TAPAZOLE) 5 MG tablet TAKE 1 TABLET (5 MG TOTAL) BY MOUTH 3 (THREE) TIMES A WEEK. 12 tablet 3   No current facility-administered medications on file prior to visit.    Review of Systems Constitutional: Negative for other unusual diaphoresis, sweats, appetite or weight changes HENT: Negative for other worsening hearing loss, ear pain, facial swelling, mouth sores or neck stiffness.   Eyes: Negative for other worsening pain, redness or other visual disturbance.  Respiratory: Negative for other stridor or swelling Cardiovascular: Negative for other palpitations or other chest pain  Gastrointestinal: Negative for worsening diarrhea or loose stools, blood in stool, distention or other pain Genitourinary: Negative for hematuria, flank pain or other change in urine volume.  Musculoskeletal: Negative for myalgias or other joint swelling.  Skin: Negative for other color change, or other wound or worsening drainage.  Neurological: Negative for other syncope or numbness. Hematological: Negative for other adenopathy or swelling Psychiatric/Behavioral: Negative for hallucinations,  other worsening agitation, SI, self-injury, or new decreased concentration All other system neg per pt    Objective:   Physical Exam BP 140/88   Pulse (!) 58   Temp 97.8 F (36.6 C) (Oral)   Ht 5\' 4"  (1.626 m)   Wt 199 lb (90.3 kg)   SpO2 97%   BMI 34.16 kg/m    VS noted,  Constitutional: Pt is oriented to person, place, and time. Appears well-developed and well-nourished, in no significant distress and comfortable Head: Normocephalic and atraumatic  Eyes: Conjunctivae and EOM are normal. Pupils are equal, round, and reactive to light Right Ear: External ear normal without discharge Left Ear: External ear normal without discharge Nose: Nose without discharge or deformity Mouth/Throat: Oropharynx is without other ulcerations and moist  Neck: Normal range of motion. Neck supple. No JVD present. No tracheal deviation present or significant neck LA or mass Cardiovascular: Normal rate, regular rhythm, normal heart sounds and intact distal pulses.   Pulmonary/Chest: WOB normal and breath sounds without rales or wheezing  Abdominal: Soft. Bowel sounds are normal. NT. No HSM  Musculoskeletal: Normal range of motion. Exhibits no edema Lymphadenopathy: Has no other cervical adenopathy.  Neurological: Pt is alert and oriented to person, place, and time. Pt has normal reflexes. No cranial nerve deficit. Motor grossly intact, Gait intact Skin: Skin is warm and dry. No rash noted or new ulcerations Psychiatric:  Has normal mood and affect. Behavior is normal without agitation No other exam findings Lab Results  Component Value Date   WBC 7.6 12/03/2016   HGB 14.4 12/03/2016   HCT 42.3 12/03/2016   PLT 184.0 12/03/2016   GLUCOSE 107 (H) 12/03/2016   CHOL 195 12/03/2016   TRIG 143.0 12/03/2016   HDL 51.10 12/03/2016   LDLDIRECT 86.0 10/04/2014   LDLCALC 115 (H) 12/03/2016   ALT 13 12/03/2016   AST 23 12/03/2016   NA 140 12/03/2016   K 3.7 12/03/2016   CL 106 12/03/2016   CREATININE 1.29 12/03/2016   BUN 15 12/03/2016   CO2 29 12/03/2016   TSH 0.78 12/03/2016   PSA 2.78 12/03/2016   HGBA1C 5.8 12/03/2016       Assessment & Plan:

## 2017-12-12 ENCOUNTER — Other Ambulatory Visit: Payer: Self-pay | Admitting: Internal Medicine

## 2018-01-16 ENCOUNTER — Other Ambulatory Visit: Payer: Self-pay | Admitting: Endocrinology

## 2018-01-16 NOTE — Telephone Encounter (Signed)
Please refill x 1 Ov is due  

## 2018-02-03 ENCOUNTER — Other Ambulatory Visit: Payer: Self-pay | Admitting: Internal Medicine

## 2018-05-02 ENCOUNTER — Other Ambulatory Visit: Payer: Self-pay | Admitting: Internal Medicine

## 2018-06-10 ENCOUNTER — Other Ambulatory Visit: Payer: Self-pay | Admitting: Internal Medicine

## 2018-06-28 NOTE — Progress Notes (Addendum)
Subjective:   Jack [name] is a 73 y.o. male who presents for Medicare Annual/Subsequent preventive examination.  Review of Systems:  No ROS.  Medicare Wellness Visit. Additional risk factors are reflected in the social history.  Cardiac Risk Factors include: advanced age (>70men, >7 women);hypertension;male gender Sleep patterns: feels rested on waking, gets up 1 times nightly to void and sleeps 7-8 hours nightly.    Home Safety/Smoke Alarms: Feels safe in home. Smoke alarms in place.  Living environment; residence and Firearm Safety: 1-story house/ trailer. Lives with wife, no needs for DME, good support system Seat Belt Safety/Bike Helmet: Wears seat belt.   PSA-  Lab Results  Component Value Date   PSA 2.54 12/06/2017   PSA 2.78 12/03/2016   PSA 3.01 10/04/2014      Objective:    Vitals: BP (!) 143/82   Pulse 76   Resp 17   Ht 5\' 4"  (1.626 m)   Wt 202 lb (91.6 kg)   PF 99 L/min   BMI 34.67 kg/m   Body mass index is 34.67 kg/m.  Advanced Directives 06/29/2018 05/28/2015 05/01/2015 04/16/2015 04/12/2015 04/12/2015 04/09/2015  Does Patient Have a Medical Advance Directive? No No No No No No No  Would patient like information on creating a medical advance directive? Yes (ED - Information included in AVS) No - patient declined information No - patient declined information No - patient declined information - No - patient declined information -    Tobacco Social History   Tobacco Use  Smoking Status Former Smoker  . Packs/day: 0.25  . Years: 30.00  . Pack years: 7.50  . Types: Cigarettes  . Last attempt to quit: 04/27/1984  . Years since quitting: 34.1  Smokeless Tobacco Never Used     Counseling given: Not Answered  Past Medical History:  Diagnosis Date  . ALLERGIC RHINITIS 02/16/2007  . 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   . BACK PAIN 02/16/2007  . COPD (chronic obstructive pulmonary disease) ([place])   . Diverticulosis 09/27/2013  . FOOT PAIN, CHRONIC 02/27/2008  .  Hepatitis C antibody test positive   . 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 HYPERLIPIDEMIA 02/16/2007  . HYPERTENSION 02/16/2007  . Personal history of colonic polyps 03/08/2007  . PROSTATE SPECIFIC ANTIGEN, ELEVATED 02/16/2007  . Shortness of breath dyspnea    Past Surgical History:  Procedure Laterality Date  . FOOT SURGERY     reconstruction x's 4 after work accident   Family History  Problem Relation Age of Onset  . Cancer Mother        throat & lung  . Coronary artery disease Father   . Hypertension Father   . Cancer Sister   . Alcohol abuse Brother   . Thyroid disease Neg Hx    Social History   Socioeconomic History  . Marital status: Married    Spouse name: Not on file  . Number of children: 3  . Years of education: Not on file  . Highest education level: Not on file  Occupational History  . Not on file  Social Needs  . Financial resource strain: Somewhat hard  . Food insecurity:    Worry: Never true    Inability: Never true  . Transportation needs:    Medical: No    Non-medical: No  Tobacco Use  . Smoking status: Former Smoker    Packs/day: 0.25    Years: 30.00    Pack years: 7.50    Types: Cigarettes  Last attempt to quit: 04/27/1984    Years since quitting: 34.1  . Smokeless tobacco: Never Used  Substance and Sexual Activity  . Alcohol use: No    Alcohol/week: 0.0 standard drinks    Comment: no  . Drug use: No  . Sexual activity: Yes    Partners: Female  Lifestyle  . Physical activity:    Days per week: 0 days    Minutes per session: 0 min  . Stress: To some extent  Relationships  . Social connections:    Talks on phone: More than three times a week    Gets together: More than three times a week    Attends religious service: More than 4 times per year    Active member of club or organization: Yes    Attends meetings of clubs or organizations: More than 4 times per year    Relationship status: Married  Other Topics Concern  . Not on file    Social History Narrative   11 grade education   Married 1968   2 son's='69, '70; 1 dtr '80; 5 grandsons   Retired on disability from foot injury. Had worked as a Ecologist   Marriage in good health-wife smokes 3 pks day    Outpatient Encounter Medications as of 06/29/2018  Medication Sig  . albuterol (PROVENTIL HFA;VENTOLIN HFA) 108 (90 Base) MCG/ACT inhaler Inhale 1-2 puffs into the lungs every 6 (six) hours as needed for wheezing or shortness of breath.  Marland Kitchen atenolol (TENORMIN) 25 MG tablet Take 1 tablet (25 mg total) by mouth daily.  . cetirizine (ZYRTEC) 10 MG tablet Take 1 tablet (10 mg total) by mouth daily.  . clonazePAM (KLONOPIN) 0.5 MG tablet TAKE 1 TABLET BY MOUTH TWICE A DAY AS NEEDED FOR ANXIETY  . famotidine (PEPCID) 20 MG tablet TAKE 1 TABLET BY MOUTH EVERYDAY AT BEDTIME  . finasteride (PROSCAR) 5 MG tablet Take 1 tablet (5 mg total) by mouth daily.  Marland Kitchen levocetirizine (XYZAL) 5 MG tablet Take 1 tablet (5 mg total) by mouth daily as needed for allergies.  Marland Kitchen losartan-hydrochlorothiazide (HYZAAR) 50-12.5 MG tablet TAKE 1 TABLET BY MOUTH EVERY DAY  . methimazole (TAPAZOLE) 5 MG tablet Take 1 tablet (5 mg total) by mouth 3 (three) times a week. NEED APPOINTMENT  . pantoprazole (PROTONIX) 40 MG tablet Take 1 tablet (40 mg total) by mouth daily.  . rosuvastatin (CRESTOR) 40 MG tablet Take 1 tablet (40 mg total) by mouth daily.  . simvastatin (ZOCOR) 40 MG tablet TAKE 1 TABLET BY MOUTH EVERY DAY AT 6PM  . tiZANidine (ZANAFLEX) 4 MG tablet Take 1 tablet (4 mg total) by mouth every 6 (six) hours as needed for muscle spasms.  . [DISCONTINUED] losartan-hydrochlorothiazide (HYZAAR) 50-12.5 MG tablet Take 1 tablet by mouth daily. (Patient not taking: Reported on 06/29/2018)  . [DISCONTINUED] methimazole (TAPAZOLE) 5 MG tablet Take 1 tablet (5 mg total) by mouth 3 (three) times a week. (Patient not taking: Reported on 06/29/2018)   No facility-administered encounter medications on file as  of 06/29/2018.     Activities of Daily Living In your present state of health, do you have any difficulty performing the following activities: 06/29/2018  Hearing? Y  Vision? N  Difficulty concentrating or making decisions? Y  Walking or climbing stairs? N  Dressing or bathing? N  Doing errands, shopping? N  Preparing Food and eating ? N  Using the Toilet? N  In the past six months, have you accidently leaked urine? N  Do you have problems with loss of bowel control? N  Managing your Medications? N  Managing your Finances? N  Housekeeping or managing your Housekeeping? N  Some recent data might be hidden    Patient Care Team: Biagio Borg, MD as PCP - General (Internal Medicine) Festus Aloe, MD as Attending Physician (Urology) Philemon Kingdom, MD as Consulting Physician (Internal Medicine) Gatha Mayer, MD as Consulting Physician (Gastroenterology)   Assessment:   This is a routine wellness examination for [place]. Physical assessment deferred to PCP.  Exercise Activities and Dietary recommendations Current Exercise Habits: The patient does not participate in regular exercise at present(take he stays busy doing work around the house), Exercise limited by: orthopedic condition(s)  Diet (meal preparation, eat out, water intake, caffeinated beverages, dairy products, fruits and vegetables): in general, a "healthy" diet  , well balanced  Reviewed heart healthy diet. Encouraged patient to increase daily water and healthy fluid intake.  Goals    . Exercise 3x per week (30 min per time)     Wants to stay flexible; will start some home exercise    . Patient Stated     Stay relaxed as much as possible by going to my room and watching TV.  Continue to  go outside and enjoy.       Fall Risk Fall Risk  06/29/2018 12/06/2017 12/03/2016 12/03/2015 10/04/2014  Falls in the past year? 1 No No No No  Number falls in past yr: 0 - - - -  Injury with Fall? 0 - - - -  Risk for fall due to :  History of fall(s);Impaired balance/gait - - - -  Risk for fall due to: Comment trip out in the yard - - - -   Depression Screen PHQ 2/9 Scores 06/29/2018 12/03/2016 12/03/2015 10/04/2014  PHQ - 2 Score 1 0 0 0  PHQ- 9 Score 1 - - -    Cognitive Function MMSE - Mini Mental State Exam 06/29/2018  Orientation to time 5  Orientation to Place 5  Registration 3  Attention/ Calculation 3  Recall 1  Language- name 2 objects 2  Language- repeat 1  Language- follow 3 step command 3  Language- read & follow direction 1  Write a sentence 1  Copy design 0  Total score 25        Immunization History  Administered Date(s) Administered  . Influenza Whole 02/16/2007, 02/27/2008  . Influenza, High Dose Seasonal PF 03/09/2016, 06/29/2018  . Influenza,inj,Quad PF,6+ Mos 05/17/2014  . Pneumococcal Conjugate-13 09/27/2013  . Pneumococcal Polysaccharide-23 02/27/2008, 10/04/2014  . Td 02/16/2007  . Tdap 12/03/2016   Screening Tests Health Maintenance  Topic Date Due  . COLONOSCOPY  06/13/2020  . TETANUS/TDAP  12/04/2026  . INFLUENZA VACCINE  Completed  . Hepatitis C Screening  Completed  . PNA vac Low Risk Adult  Completed      Plan:    Senior community resources provided.  Reviewed health maintenance screenings with patient today and relevant education, vaccines, and/or referrals were provided.   Continue doing brain stimulating activities (puzzles, reading, adult coloring books, staying active) to keep memory sharp.   Continue to eat heart healthy diet (full of fruits, vegetables, whole grains, lean protein, water--limit salt, fat, and sugar intake) and increase physical activity as tolerated.  I have personally reviewed and noted the following in the patient's chart:   . Medical and social history . Use of alcohol, tobacco or illicit drugs  . Current medications and  supplements . Functional ability and status . Nutritional status . Physical activity . Advanced directives . List  of other physicians . Vitals . Screenings to include cognitive, depression, and falls . Referrals and appointments  In addition, I have reviewed and discussed with patient certain preventive protocols, quality metrics, and best practice recommendations. A written personalized care plan for preventive services as well as general preventive health recommendations were provided to patient.     Michiel Cowboy, RN  06/29/2018  Medical screening examination/treatment/procedure(s) were performed by non-physician practitioner and as supervising physician I was immediately available for consultation/collaboration. I agree with above. Cathlean Cower, MD

## 2018-06-29 ENCOUNTER — Ambulatory Visit (INDEPENDENT_AMBULATORY_CARE_PROVIDER_SITE_OTHER): Payer: Medicare Other | Admitting: *Deleted

## 2018-06-29 ENCOUNTER — Other Ambulatory Visit: Payer: Self-pay | Admitting: Internal Medicine

## 2018-06-29 ENCOUNTER — Telehealth: Payer: Self-pay | Admitting: *Deleted

## 2018-06-29 VITALS — BP 143/82 | HR 76 | Resp 17 | Ht 64.0 in | Wt 202.0 lb

## 2018-06-29 DIAGNOSIS — Z0000 Encounter for general adult medical examination without abnormal findings: Secondary | ICD-10-CM

## 2018-06-29 DIAGNOSIS — Z23 Encounter for immunization: Secondary | ICD-10-CM | POA: Diagnosis not present

## 2018-06-29 NOTE — Telephone Encounter (Signed)
Ok to take the crestor only

## 2018-06-29 NOTE — Telephone Encounter (Signed)
During AWV, patient states that he would like clarification from PCP regarding which statin  to take. His pharmacist explained to him that he has two active statin prescriptions, simvastatin and rosuvastatin.

## 2018-06-29 NOTE — Patient Instructions (Addendum)
Continue doing brain stimulating activities (puzzles, reading, adult coloring books, staying active) to keep memory sharp.   Continue to eat heart healthy diet (full of fruits, vegetables, whole grains, lean protein, water--limit salt, fat, and sugar intake) and increase physical activity as tolerated.   Jack [name] , Thank you for taking time to come for your Medicare Wellness Visit. I appreciate your ongoing commitment to your health goals. Please review the following plan we discussed and let me know if I can assist you in the future.   These are the goals we discussed: Goals    . Exercise 3x per week (30 min per time)     Wants to stay flexible; will start some home exercise    . Patient Stated     Stay relaxed as much as possible by going to my room and watching TV. I also go outside and enjoy and rest.       This is a list of the screening recommended for you and due dates:  Health Maintenance  Topic Date Due  . Flu Shot  07/27/2018*  . Colon Cancer Screening  06/13/2020  . Tetanus Vaccine  12/04/2026  .  Hepatitis C: One time screening is recommended by Center for Disease Control  (CDC) for  adults born from [birth year] through 1965.   Completed  . Pneumonia vaccines  Completed  *Topic was postponed. The date shown is not the original due date.     Influenza Virus Vaccine injection What is this medicine? INFLUENZA VIRUS VACCINE (in floo EN zuh VAHY ruhs vak SEEN) helps to reduce the risk of getting influenza also known as the flu. The vaccine only helps protect you against some strains of the flu. This medicine may be used for other purposes; ask your health care provider or pharmacist if you have questions. COMMON BRAND NAME(S): Afluria, Agriflu, Alfuria, FLUAD, Fluarix, Fluarix Quadrivalent, Flublok, Flublok Quadrivalent, FLUCELVAX, Flulaval, Fluvirin, Fluzone, Fluzone High-Dose, Fluzone Intradermal What should I tell my health care provider before I take this medicine? They need  to know if you have any of these conditions: -bleeding disorder like hemophilia -fever or infection -Guillain-Barre syndrome or other neurological problems -immune system problems -infection with the human immunodeficiency virus (HIV) or AIDS -low blood platelet counts -multiple sclerosis -an unusual or allergic reaction to influenza virus vaccine, latex, other medicines, foods, dyes, or preservatives. Different brands of vaccines contain different allergens. Some may contain latex or eggs. Talk to your doctor about your allergies to make sure that you get the right vaccine. -pregnant or trying to get pregnant -breast-feeding How should I use this medicine? This vaccine is for injection into a muscle or under the skin. It is given by a health care professional. A copy of Vaccine Information Statements will be given before each vaccination. Read this sheet carefully each time. The sheet may change frequently. Talk to your healthcare provider to see which vaccines are right for you. Some vaccines should not be used in all age groups. Overdosage: If you think you have taken too much of this medicine contact a poison control center or emergency room at once. NOTE: This medicine is only for you. Do not share this medicine with others. What if I miss a dose? This does not apply. What may interact with this medicine? -chemotherapy or radiation therapy -medicines that lower your immune system like etanercept, anakinra, infliximab, and adalimumab -medicines that treat or prevent blood clots like warfarin -phenytoin -steroid medicines like prednisone or cortisone -theophylline -  vaccines This list may not describe all possible interactions. Give your health care provider a list of all the medicines, herbs, non-prescription drugs, or dietary supplements you use. Also tell them if you smoke, drink alcohol, or use illegal drugs. Some items may interact with your medicine. What should I watch for while  using this medicine? Report any side effects that do not go away within 3 days to your doctor or health care professional. Call your health care provider if any unusual symptoms occur within 6 weeks of receiving this vaccine. You may still catch the flu, but the illness is not usually as bad. You cannot get the flu from the vaccine. The vaccine will not protect against colds or other illnesses that may cause fever. The vaccine is needed every year. What side effects may I notice from receiving this medicine? Side effects that you should report to your doctor or health care professional as soon as possible: -allergic reactions like skin rash, itching or hives, swelling of the face, lips, or tongue Side effects that usually do not require medical attention (report to your doctor or health care professional if they continue or are bothersome): -fever -headache -muscle aches and pains -pain, tenderness, redness, or swelling at the injection site -tiredness This list may not describe all possible side effects. Call your doctor for medical advice about side effects. You may report side effects to FDA at 1-800-FDA-1088. Where should I keep my medicine? The vaccine will be given by a health care professional in a clinic, pharmacy, doctor's office, or other health care setting. You will not be given vaccine doses to store at home. NOTE: This sheet is a summary. It may not cover all possible information. If you have questions about this medicine, talk to your doctor, pharmacist, or health care provider.  2019 Elsevier/Gold Standard (2014-11-02 10:07:28)  Preventive Care 37 Years and Older, Male Preventive care refers to lifestyle choices and visits with your health care provider that can promote health and wellness. What does preventive care include?   A yearly physical exam. This is also called an annual well check.  Dental exams once or twice a year.  Routine eye exams. Ask your health care  provider how often you should have your eyes checked.  Personal lifestyle choices, including: ? Daily care of your teeth and gums. ? Regular physical activity. ? Eating a healthy diet. ? Avoiding tobacco and drug use. ? Limiting alcohol use. ? Practicing safe sex. ? Taking low doses of aspirin every day. ? Taking vitamin and mineral supplements as recommended by your health care provider. What happens during an annual well check? The services and screenings done by your health care provider during your annual well check will depend on your age, overall health, lifestyle risk factors, and family history of disease. Counseling Your health care provider may ask you questions about your:  Alcohol use.  Tobacco use.  Drug use.  Emotional well-being.  Home and relationship well-being.  Sexual activity.  Eating habits.  History of falls.  Memory and ability to understand (cognition).  Work and work Statistician. Screening You may have the following tests or measurements:  Height, weight, and BMI.  Blood pressure.  Lipid and cholesterol levels. These may be checked every 5 years, or more frequently if you are over 35 years old.  Skin check.  Lung cancer screening. You may have this screening every year starting at age 19 if you have a 30-pack-year history of smoking and currently smoke  or have quit within the past 15 years.  Colorectal cancer screening. All adults should have this screening starting at age 60 and continuing until age 48. You will have tests every 1-10 years, depending on your results and the type of screening test. People at increased risk should start screening at an earlier age. Screening tests may include: ? Guaiac-based fecal occult blood testing. ? Fecal immunochemical test (FIT). ? Stool DNA test. ? Virtual colonoscopy. ? Sigmoidoscopy. During this test, a flexible tube with a tiny camera (sigmoidoscope) is used to examine your rectum and lower  colon. The sigmoidoscope is inserted through your anus into your rectum and lower colon. ? Colonoscopy. During this test, a long, thin, flexible tube with a tiny camera (colonoscope) is used to examine your entire colon and rectum.  Prostate cancer screening. Recommendations will vary depending on your family history and other risks.  Hepatitis C blood test.  Hepatitis B blood test.  Sexually transmitted disease (STD) testing.  Diabetes screening. This is done by checking your blood sugar (glucose) after you have not eaten for a while (fasting). You may have this done every 1-3 years.  Abdominal aortic aneurysm (AAA) screening. You may need this if you are a current or former smoker.  Osteoporosis. You may be screened starting at age 20 if you are at high risk. Talk with your health care provider about your test results, treatment options, and if necessary, the need for more tests. Vaccines Your health care provider may recommend certain vaccines, such as:  Influenza vaccine. This is recommended every year.  Tetanus, diphtheria, and acellular pertussis (Tdap, Td) vaccine. You may need a Td booster every 10 years.  Varicella vaccine. You may need this if you have not been vaccinated.  Zoster vaccine. You may need this after age 28.  Measles, mumps, and rubella (MMR) vaccine. You may need at least one dose of MMR if you were born in 1957 or later. You may also need a second dose.  Pneumococcal 13-valent conjugate (PCV13) vaccine. One dose is recommended after age 35.  Pneumococcal polysaccharide (PPSV23) vaccine. One dose is recommended after age 78.  Meningococcal vaccine. You may need this if you have certain conditions.  Hepatitis A vaccine. You may need this if you have certain conditions or if you travel or work in places where you may be exposed to hepatitis A.  Hepatitis B vaccine. You may need this if you have certain conditions or if you travel or work in places where you  may be exposed to hepatitis B.  Haemophilus influenzae type b (Hib) vaccine. You may need this if you have certain risk factors. Talk to your health care provider about which screenings and vaccines you need and how often you need them. This information is not intended to replace advice given to you by your health care provider. Make sure you discuss any questions you have with your health care provider. Document Released: 05/10/2015 Document Revised: 06/03/2017 Document Reviewed: 02/12/2015 Elsevier Interactive Patient Education  2019 [employer].

## 2018-06-30 NOTE — Telephone Encounter (Signed)
Called and spoke to Patient's wife Ivor Kishi and informed her that patient should take prescription Crestor only. Ms. Soulliere verbalized understanding.

## 2018-07-15 ENCOUNTER — Other Ambulatory Visit: Payer: Self-pay | Admitting: Endocrinology

## 2018-07-18 ENCOUNTER — Telehealth: Payer: Self-pay | Admitting: Internal Medicine

## 2018-07-18 MED ORDER — METHIMAZOLE 5 MG PO TABS: 5.0000 mg | ORAL_TABLET | ORAL | 1 refills | Status: DC

## 2018-07-18 NOTE — Telephone Encounter (Signed)
Copied from [place] [phone]. Topic: General - Other >> Jul 18, 2018 11:58 AM Lennox Solders wrote: Reason for CRM: pt wife left message on refill voicemail. Pt needs refill on methimazole. [facility]

## 2018-07-25 ENCOUNTER — Other Ambulatory Visit: Payer: Self-pay | Admitting: Internal Medicine

## 2018-10-19 ENCOUNTER — Other Ambulatory Visit: Payer: Self-pay | Admitting: Internal Medicine

## 2018-12-01 ENCOUNTER — Other Ambulatory Visit: Payer: Self-pay | Admitting: Internal Medicine

## 2018-12-06 ENCOUNTER — Other Ambulatory Visit: Payer: Self-pay | Admitting: Internal Medicine

## 2018-12-08 ENCOUNTER — Encounter: Payer: Self-pay | Admitting: Internal Medicine

## 2018-12-08 ENCOUNTER — Other Ambulatory Visit: Payer: Self-pay

## 2018-12-08 ENCOUNTER — Ambulatory Visit (INDEPENDENT_AMBULATORY_CARE_PROVIDER_SITE_OTHER): Payer: Medicare Other | Admitting: Internal Medicine

## 2018-12-08 ENCOUNTER — Other Ambulatory Visit (INDEPENDENT_AMBULATORY_CARE_PROVIDER_SITE_OTHER): Payer: Medicare Other

## 2018-12-08 VITALS — BP 118/82 | HR 75 | Temp 98.2°F | Ht 64.0 in | Wt 208.0 lb

## 2018-12-08 DIAGNOSIS — R739 Hyperglycemia, unspecified: Secondary | ICD-10-CM | POA: Diagnosis not present

## 2018-12-08 DIAGNOSIS — F329 Major depressive disorder, single episode, unspecified: Secondary | ICD-10-CM

## 2018-12-08 DIAGNOSIS — F419 Anxiety disorder, unspecified: Secondary | ICD-10-CM

## 2018-12-08 DIAGNOSIS — M5431 Sciatica, right side: Secondary | ICD-10-CM | POA: Diagnosis not present

## 2018-12-08 DIAGNOSIS — Z0001 Encounter for general adult medical examination with abnormal findings: Secondary | ICD-10-CM | POA: Diagnosis not present

## 2018-12-08 DIAGNOSIS — E538 Deficiency of other specified B group vitamins: Secondary | ICD-10-CM | POA: Diagnosis not present

## 2018-12-08 DIAGNOSIS — Z0000 Encounter for general adult medical examination without abnormal findings: Secondary | ICD-10-CM

## 2018-12-08 DIAGNOSIS — E611 Iron deficiency: Secondary | ICD-10-CM

## 2018-12-08 DIAGNOSIS — E559 Vitamin D deficiency, unspecified: Secondary | ICD-10-CM

## 2018-12-08 DIAGNOSIS — J309 Allergic rhinitis, unspecified: Secondary | ICD-10-CM

## 2018-12-08 LAB — URINALYSIS, ROUTINE W REFLEX MICROSCOPIC
Bilirubin Urine: NEGATIVE
Hgb urine dipstick: NEGATIVE
Ketones, ur: NEGATIVE
Leukocytes,Ua: NEGATIVE
Nitrite: NEGATIVE
Specific Gravity, Urine: >=1.03 — AB (ref 1.000–1.030)
Total Protein, Urine: NEGATIVE
Urine Glucose: NEGATIVE
Urobilinogen, UA: 0.2 (ref 0.0–1.0)
pH: 6 (ref 5.0–8.0)

## 2018-12-08 LAB — LIPID PANEL
Cholesterol: 125 mg/dL (ref 0–200)
HDL: 46.1 mg/dL (ref >39.00)
LDL Cholesterol: 59 mg/dL (ref 0–99)
NonHDL: 79.12
Total CHOL/HDL Ratio: 3
Triglycerides: 102 mg/dL (ref 0.0–149.0)
VLDL: 20.4 mg/dL (ref 0.0–40.0)

## 2018-12-08 LAB — PSA: PSA: 2.8 ng/mL (ref 0.10–4.00)

## 2018-12-08 LAB — CBC WITH DIFFERENTIAL/PLATELET
Basophils Absolute: 0 10*3/uL (ref 0.0–0.1)
Basophils Relative: 0.5 % (ref 0.0–3.0)
Eosinophils Absolute: 0.1 10*3/uL (ref 0.0–0.7)
Eosinophils Relative: 1.7 % (ref 0.0–5.0)
HCT: 40.6 % (ref 39.0–52.0)
Hemoglobin: 14.2 g/dL (ref 13.0–17.0)
Lymphocytes Relative: 34 % (ref 12.0–46.0)
Lymphs Abs: 2.7 10*3/uL (ref 0.7–4.0)
MCHC: 34.9 g/dL (ref 30.0–36.0)
MCV: 93.9 fl (ref 78.0–100.0)
Monocytes Absolute: 0.7 10*3/uL (ref 0.1–1.0)
Monocytes Relative: 9.3 % (ref 3.0–12.0)
Neutro Abs: 4.4 10*3/uL (ref 1.4–7.7)
Neutrophils Relative %: 54.5 % (ref 43.0–77.0)
Platelets: 191 10*3/uL (ref 150.0–400.0)
RBC: 4.32 Mil/uL (ref 4.22–5.81)
RDW: 13 % (ref 11.5–15.5)
WBC: 8 10*3/uL (ref 4.0–10.5)

## 2018-12-08 LAB — HEMOGLOBIN A1C: Hgb A1c MFr Bld: 6.2 % (ref 4.6–6.5)

## 2018-12-08 LAB — HEPATIC FUNCTION PANEL
ALT: 12 U/L (ref 0–53)
AST: 23 U/L (ref 0–37)
Albumin: 4.3 g/dL (ref 3.5–5.2)
Alkaline Phosphatase: 80 U/L (ref 39–117)
Bilirubin, Direct: 0.1 mg/dL (ref 0.0–0.3)
Total Bilirubin: 0.5 mg/dL (ref 0.2–1.2)
Total Protein: 7.2 g/dL (ref 6.0–8.3)

## 2018-12-08 LAB — BASIC METABOLIC PANEL
BUN: 16 mg/dL (ref 6–23)
CO2: 25 mEq/L (ref 19–32)
Calcium: 9.6 mg/dL (ref 8.4–10.5)
Chloride: 105 mEq/L (ref 96–112)
Creatinine, Ser: 1.32 mg/dL (ref 0.40–1.50)
GFR: 64.29 mL/min (ref >60.00)
Glucose, Bld: 116 mg/dL — ABNORMAL HIGH (ref 70–99)
Potassium: 4 mEq/L (ref 3.5–5.1)
Sodium: 140 mEq/L (ref 135–145)

## 2018-12-08 LAB — VITAMIN B12: Vitamin B-12: 199 pg/mL — ABNORMAL LOW (ref 211–911)

## 2018-12-08 LAB — IBC PANEL
Iron: 61 ug/dL (ref 42–165)
Saturation Ratios: 22.8 % (ref 20.0–50.0)
Transferrin: 191 mg/dL — ABNORMAL LOW (ref 212.0–360.0)

## 2018-12-08 LAB — VITAMIN D 25 HYDROXY (VIT D DEFICIENCY, FRACTURES): VITD: 36.44 ng/mL (ref 30.00–100.00)

## 2018-12-08 LAB — TSH: TSH: 0.88 u[IU]/mL (ref 0.35–4.50)

## 2018-12-08 MED ORDER — TRIAMCINOLONE ACETONIDE 55 MCG/ACT NA AERO: 2.0000 | INHALATION_SPRAY | Freq: Every day | NASAL | 3 refills | Status: DC

## 2018-12-08 MED ORDER — CITALOPRAM HYDROBROMIDE 10 MG PO TABS: 10.0000 mg | ORAL_TABLET | Freq: Every day | ORAL | 3 refills | Status: DC

## 2018-12-08 NOTE — Patient Instructions (Signed)
Please continue all other medications as before, and refills have been done if requested.  Please have the pharmacy call with any other refills you may need.  Please continue your efforts at being more active, low cholesterol diet, and weight control.  You are otherwise up to date with prevention measures today.  Please keep your appointments with your specialists as you may have planned   

## 2018-12-08 NOTE — Progress Notes (Signed)
Subjective:    Patient ID: Jack [name], male    DOB: [birth year], 73 y.o.   MRN: [id number]  HPI  Here for wellness and f/u;  Overall doing ok;  Pt denies Chest pain, worsening SOB, DOE, wheezing, orthopnea, PND, worsening LE edema, palpitations, dizziness or syncope.  Pt denies neurological change such as new headache, facial or extremity weakness.  Pt denies polydipsia, polyuria, or low sugar symptoms. Pt states overall good compliance with treatment and medications, good tolerability, and has been trying to follow appropriate diet.  No fever, night sweats, wt loss, loss of appetite, or other constitutional symptoms.  Pt states good ability with ADL's, has low fall risk, home safety reviewed and adequate, no other significant changes in hearing or vision, and only occasionally active with exercise. BP Readings from Last 3 Encounters:  12/08/18 118/82  06/29/18 (!) 143/82  12/06/17 140/88   Wt Readings from Last 3 Encounters:  12/08/18 208 lb (94.3 kg)  06/29/18 202 lb (91.6 kg)  12/06/17 199 lb (90.3 kg)  Saw urology with BPH in June 2020, doing well.  Also, Does have several wks ongoing nasal allergy symptoms with clearish congestion, itch and sneezing, without fever, pain, ST, cough, swelling or wheezing.  Also c/o worsening depression symptoms without SI or HI for several months.asks for tx, [facility] counseling referral  Also, Pt continues to have recurring right LBP without change in severity, bowel or bladder change, fever, wt loss,  worsening LE numbness/weakness, gait change or falls. Past Medical History:  Diagnosis Date  . ALLERGIC RHINITIS 02/16/2007  . 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   . BACK PAIN 02/16/2007  . COPD (chronic obstructive pulmonary disease) ([place])   . Diverticulosis 09/27/2013  . FOOT PAIN, CHRONIC 02/27/2008  . Hepatitis C antibody test positive   . 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 HYPERLIPIDEMIA 02/16/2007  . HYPERTENSION 02/16/2007  . Personal history of colonic  polyps 03/08/2007  . PROSTATE SPECIFIC ANTIGEN, ELEVATED 02/16/2007  . Shortness of breath dyspnea    Past Surgical History:  Procedure Laterality Date  . FOOT SURGERY     reconstruction x's 4 after work accident    reports that he quit smoking about 34 years ago. His smoking use included cigarettes. He has a 7.50 pack-year smoking history. He has never used smokeless tobacco. He reports that he does not drink alcohol or use drugs. family history includes Alcohol abuse in his brother; Cancer in his mother and sister; Coronary artery disease in his father; Hypertension in his father. No Known Allergies Current Outpatient Medications on File Prior to Visit  Medication Sig Dispense Refill  . albuterol (PROVENTIL HFA;VENTOLIN HFA) 108 (90 Base) MCG/ACT inhaler Inhale 1-2 puffs into the lungs every 6 (six) hours as needed for wheezing or shortness of breath. 54 g 11  . atenolol (TENORMIN) 25 MG tablet Take 1 tablet (25 mg total) by mouth daily. 90 tablet 3  . cetirizine (ZYRTEC) 10 MG tablet Take 1 tablet (10 mg total) by mouth daily. 90 tablet 3  . clonazePAM (KLONOPIN) 0.5 MG tablet TAKE 1 TABLET BY MOUTH TWICE A DAY AS NEEDED FOR ANXIETY 60 tablet 2  . famotidine (PEPCID) 20 MG tablet TAKE 1 TABLET BY MOUTH EVERYDAY AT BEDTIME 90 tablet 1  . finasteride (PROSCAR) 5 MG tablet Take 1 tablet (5 mg total) by mouth daily. 90 tablet 3  . levocetirizine (XYZAL) 5 MG tablet Take 1 tablet (5 mg total) by mouth daily as needed for  allergies. 90 tablet 1  . losartan-hydrochlorothiazide (HYZAAR) 50-12.5 MG tablet TAKE 1 TABLET BY MOUTH EVERY DAY 90 tablet 3  . methimazole (TAPAZOLE) 5 MG tablet Take 1 tablet (5 mg total) by mouth 3 (three) times a week. NEED APPOINTMENT 36 tablet 1  . pantoprazole (PROTONIX) 40 MG tablet TAKE 1 TABLET BY MOUTH EVERY DAY 90 tablet 3  . rosuvastatin (CRESTOR) 40 MG tablet Take 1 tablet (40 mg total) by mouth daily. 90 tablet 3  . simvastatin (ZOCOR) 40 MG tablet TAKE 1 TAB  BY MOUTH DAILY AT 6 PM. FOLLOW-UP APPT IS DUE MUST SEE PROVIDER FOR FUTURE REFILLS 90 tablet 0  . tiZANidine (ZANAFLEX) 4 MG tablet Take 1 tablet (4 mg total) by mouth every 6 (six) hours as needed for muscle spasms. 40 tablet 1   No current facility-administered medications on file prior to visit.    Review of Systems Constitutional: Negative for other unusual diaphoresis, sweats, appetite or weight changes HENT: Negative for other worsening hearing loss, ear pain, facial swelling, mouth sores or neck stiffness.   Eyes: Negative for other worsening pain, redness or other visual disturbance.  Respiratory: Negative for other stridor or swelling Cardiovascular: Negative for other palpitations or other chest pain  Gastrointestinal: Negative for worsening diarrhea or loose stools, blood in stool, distention or other pain Genitourinary: Negative for hematuria, flank pain or other change in urine volume.  Musculoskeletal: Negative for myalgias or other joint swelling.  Skin: Negative for other color change, or other wound or worsening drainage.  Neurological: Negative for other syncope or numbness. Hematological: Negative for other adenopathy or swelling Psychiatric/Behavioral: Negative for hallucinations, other worsening agitation, SI, self-injury, or new decreased concentration All other system neg per pt    Objective:   Physical Exam BP 118/82   Pulse 75   Temp 98.2 F (36.8 C) (Oral)   Ht 5\' 4"  (1.626 m)   Wt 208 lb (94.3 kg)   SpO2 96%   BMI 35.70 kg/m  VS noted,  Constitutional: Pt is oriented to person, place, and time. Appears well-developed and well-nourished, in no significant distress and comfortable Head: Normocephalic and atraumatic  Eyes: Conjunctivae and EOM are normal. Pupils are equal, round, and reactive to light Right Ear: External ear normal without discharge Left Ear: External ear normal without discharge Nose: Nose without discharge or deformity Bilat tm's with  mild erythema.  Max sinus areas non tender.  Pharynx with mild erythema, no exudate Mouth/Throat: Oropharynx is without other ulcerations and moist  Neck: Normal range of motion. Neck supple. No JVD present. No tracheal deviation present or significant neck LA or mass Cardiovascular: Normal rate, regular rhythm, normal heart sounds and intact distal pulses.   Pulmonary/Chest: WOB normal and breath sounds without rales or wheezing  Abdominal: Soft. Bowel sounds are normal. NT. No HSM  Musculoskeletal: Normal range of motion. Exhibits no edema Lymphadenopathy: Has no other cervical adenopathy.  Neurological: Pt is alert and oriented to person, place, and time. Pt has normal reflexes. No cranial nerve deficit. Motor grossly intact, Gait intact Skin: Skin is warm and dry. No rash noted or new ulcerations Psychiatric:  Has depressed mood and affect. Behavior is normal without agitation No other exam findings  Lab Results  Component Value Date   WBC 9.5 12/06/2017   HGB 13.9 12/06/2017   HCT 40.8 12/06/2017   PLT 204.0 12/06/2017   GLUCOSE 110 (H) 12/06/2017   CHOL 186 12/06/2017   TRIG 292.0 (H) 12/06/2017  HDL 46.90 12/06/2017   LDLDIRECT 89.0 12/06/2017   LDLCALC 115 (H) 12/03/2016   ALT 8 12/06/2017   AST 19 12/06/2017   NA 141 12/06/2017   K 3.6 12/06/2017   CL 105 12/06/2017   CREATININE 1.30 12/06/2017   BUN 14 12/06/2017   CO2 29 12/06/2017   TSH 0.36 12/06/2017   PSA 2.54 12/06/2017   HGBA1C 5.8 12/03/2016      Assessment & Plan:

## 2018-12-09 ENCOUNTER — Telehealth: Payer: Self-pay

## 2018-12-09 NOTE — Telephone Encounter (Signed)
Called pt, LVM.   CRM created.  

## 2018-12-09 NOTE — Telephone Encounter (Signed)
-----   Message from Biagio Borg, MD sent at 12/08/2018  1:04 PM EDT ----- Letter sent, cont same tx except  The test results show that your current treatment is OK, as the tests are stable, except the Vitamin B12 is low.  Please start monthly B12 shots here in the office until your next visit. I will ask the office to call you.  Jack [name] to please inform pt, and help arrange monthly b12 shots

## 2018-12-11 ENCOUNTER — Encounter: Payer: Self-pay | Admitting: Internal Medicine

## 2018-12-11 DIAGNOSIS — M5431 Sciatica, right side: Secondary | ICD-10-CM | POA: Insufficient documentation

## 2018-12-11 NOTE — Assessment & Plan Note (Signed)
stable overall by history and exam, recent data reviewed with pt, and pt to continue medical treatment as before,  to f/u any worsening symptoms or concerns  

## 2018-12-11 NOTE — Assessment & Plan Note (Signed)
Mild intermittent stable,  to f/u any worsening symptoms or concerns

## 2018-12-11 NOTE — Assessment & Plan Note (Signed)
To add nasacort asd,  to f/u any worsening symptoms or concerns 

## 2018-12-11 NOTE — Assessment & Plan Note (Addendum)
Ok to start celexa 10 qd,  to f/u any worsening symptoms or concerns, declines counseling referral  In addition to the time spent performing CPE, I spent an additional 25 minutes face to face,in which greater than 50% of this time was spent in counseling and coordination of care for patient's acute illness as documented, including the differential dx, treatment, further evaluation and other management of depression, allergies, hyperglycemia, and sciatica

## 2018-12-11 NOTE — Assessment & Plan Note (Signed)

## 2018-12-16 ENCOUNTER — Other Ambulatory Visit: Payer: Self-pay | Admitting: Internal Medicine

## 2018-12-23 ENCOUNTER — Other Ambulatory Visit: Payer: Self-pay | Admitting: Internal Medicine

## 2019-01-04 ENCOUNTER — Other Ambulatory Visit: Payer: Self-pay | Admitting: Internal Medicine

## 2019-01-04 MED ORDER — METHIMAZOLE 5 MG PO TABS: 5.0000 mg | ORAL_TABLET | ORAL | 1 refills | Status: DC

## 2019-01-04 NOTE — Telephone Encounter (Signed)
Copied from [place] [id number]. Topic: Quick Communication - Rx Refill/Question >> Jan 04, 2019  8:15 AM Leward Quan A wrote: Medication: methimazole (TAPAZOLE) 5 MG tablet   Has the patient contacted their pharmacy? Yes.   (Agent: If no, request that the patient contact the pharmacy for the refill.) (Agent: If yes, when and what did the pharmacy advise?)  Preferred Pharmacy (with phone number or street name): [id number] - [place], [place] - [address] [phone] (Phone) [phone] (Fax)    Agent: Please be advised that RX refills may take up to 3 business days. We ask that you follow-up with your pharmacy.

## 2019-01-04 NOTE — Telephone Encounter (Signed)
Requested medication (s) are due for refill today: yes  Requested medication (s) are on the active medication list: yes  Last refill:  07/18/2018  Future visit scheduled: no  Notes to clinic:  This refill cannot be delegated   Requested Prescriptions  Pending Prescriptions Disp Refills   methimazole (TAPAZOLE) 5 MG tablet 36 tablet 1    Sig: Take 1 tablet (5 mg total) by mouth 3 (three) times a week. NEED APPOINTMENT     Not Delegated - Endocrinology:  Hyperthyroid Agents Failed - 01/04/2019  8:24 AM      Failed - This refill cannot be delegated      Passed - TSH in normal range and within 180 days    TSH  Date Value Ref Range Status  12/08/2018 0.88 0.35 - 4.50 uIU/mL Final         Passed - Valid encounter within last 6 months    Recent Outpatient Visits          3 weeks ago Encounter for well adult exam with abnormal findings   [facility] -Georges Mouse, MD   1 year ago Routine health maintenance   [place], James W, MD   1 year ago [facility] -Georges Mouse, MD   2 years ago Routine health maintenance   [place], James W, MD   3 years ago Encounter for well adult exam with abnormal findings   [facility] -Junie Bame, Hunt Oris, MD      Future Appointments            In 5 months [place], [facility]

## 2019-01-14 ENCOUNTER — Other Ambulatory Visit: Payer: Self-pay | Admitting: Internal Medicine

## 2019-01-27 ENCOUNTER — Telehealth: Payer: Self-pay | Admitting: Internal Medicine

## 2019-01-27 MED ORDER — TIZANIDINE HCL 4 MG PO TABS: 4.0000 mg | ORAL_TABLET | Freq: Four times a day (QID) | ORAL | 1 refills | Status: DC | PRN

## 2019-01-27 NOTE — Telephone Encounter (Signed)
Routing to CMA 

## 2019-01-27 NOTE — Telephone Encounter (Signed)
Medication Refill - Medication: tiZANidine (ZANAFLEX) 4 MG tablet  Has the patient contacted their pharmacy? No. (Agent: If no, request that the patient contact the pharmacy for the refill.) (Agent: If yes, when and what did the pharmacy advise?)  Preferred Pharmacy (with phone number or street name): [id number] - [place], [place] - [address] [phone] (Phone) [phone] (Fax)     Agent: Please be advised that RX refills may take up to 3 business days. We ask that you follow-up with your pharmacy.

## 2019-01-27 NOTE — Telephone Encounter (Signed)
Requested medication (s) are due for refill today: yes  Requested medication (s) are on the active medication list: yes  Last refill:  12/06/2017  Future visit scheduled: no  Notes to clinic: refill cannot be delegated    Requested Prescriptions  Pending Prescriptions Disp Refills   tiZANidine (ZANAFLEX) 4 MG tablet 40 tablet 1    Sig: Take 1 tablet (4 mg total) by mouth every 6 (six) hours as needed for muscle spasms.     Not Delegated - Cardiovascular:  Alpha-2 Agonists - tizanidine Failed - 01/27/2019  3:02 PM      Failed - This refill cannot be delegated      Passed - Valid encounter within last 6 months    Recent Outpatient Visits          1 month ago Encounter for well adult exam with abnormal findings   [facility] -Georges Mouse, MD   1 year ago Routine health maintenance   [place], James W, MD   1 year ago [facility] -Georges Mouse, MD   2 years ago Routine health maintenance   [place], James W, MD   3 years ago Encounter for well adult exam with abnormal findings   [facility] -Junie Bame, Hunt Oris, MD      Future Appointments            In 5 months [place], [facility]

## 2019-02-25 ENCOUNTER — Other Ambulatory Visit: Payer: Self-pay | Admitting: Internal Medicine

## 2019-03-21 ENCOUNTER — Other Ambulatory Visit: Payer: Self-pay | Admitting: Internal Medicine

## 2019-05-08 ENCOUNTER — Other Ambulatory Visit: Payer: Self-pay | Admitting: Internal Medicine

## 2019-06-23 ENCOUNTER — Other Ambulatory Visit: Payer: Self-pay | Admitting: Internal Medicine

## 2019-06-29 ENCOUNTER — Telehealth: Payer: Self-pay | Admitting: Internal Medicine

## 2019-06-29 NOTE — Telephone Encounter (Signed)
Called patient several times (06/20/19, 06/22/19, 06/27/19, and 06/29/19) to cancel or reschedule AWV. Cancelled AWV appointment for 06/30/2019. Patient will need to give office a call to reschedule AWV.Marland Kitchen

## 2019-06-30 ENCOUNTER — Ambulatory Visit: Payer: Medicare Other

## 2019-07-14 ENCOUNTER — Ambulatory Visit (INDEPENDENT_AMBULATORY_CARE_PROVIDER_SITE_OTHER): Payer: Medicare Other | Admitting: Internal Medicine

## 2019-07-14 ENCOUNTER — Other Ambulatory Visit: Payer: Self-pay

## 2019-07-14 ENCOUNTER — Encounter: Payer: Self-pay | Admitting: Internal Medicine

## 2019-07-14 VITALS — BP 132/82 | HR 70 | Temp 98.3°F | Ht 64.0 in | Wt 202.0 lb

## 2019-07-14 DIAGNOSIS — Z0000 Encounter for general adult medical examination without abnormal findings: Secondary | ICD-10-CM | POA: Diagnosis not present

## 2019-07-14 DIAGNOSIS — R739 Hyperglycemia, unspecified: Secondary | ICD-10-CM

## 2019-07-14 DIAGNOSIS — Z125 Encounter for screening for malignant neoplasm of prostate: Secondary | ICD-10-CM

## 2019-07-14 LAB — BASIC METABOLIC PANEL
BUN: 18 mg/dL (ref 6–23)
CO2: 30 mEq/L (ref 19–32)
Calcium: 9.8 mg/dL (ref 8.4–10.5)
Chloride: 102 mEq/L (ref 96–112)
Creatinine, Ser: 1.3 mg/dL (ref 0.40–1.50)
GFR: 65.33 mL/min (ref >60.00)
Glucose, Bld: 102 mg/dL — ABNORMAL HIGH (ref 70–99)
Potassium: 4 mEq/L (ref 3.5–5.1)
Sodium: 137 mEq/L (ref 135–145)

## 2019-07-14 LAB — URINALYSIS, ROUTINE W REFLEX MICROSCOPIC
Bilirubin Urine: NEGATIVE
Hgb urine dipstick: NEGATIVE
Ketones, ur: NEGATIVE
Leukocytes,Ua: NEGATIVE
Nitrite: NEGATIVE
RBC / HPF: NONE SEEN (ref 0–?)
Specific Gravity, Urine: >=1.03 — AB (ref 1.000–1.030)
Total Protein, Urine: NEGATIVE
Urine Glucose: NEGATIVE
Urobilinogen, UA: 0.2 (ref 0.0–1.0)
WBC, UA: NONE SEEN (ref 0–?)
pH: 6 (ref 5.0–8.0)

## 2019-07-14 LAB — CBC WITH DIFFERENTIAL/PLATELET
Basophils Absolute: 0 10*3/uL (ref 0.0–0.1)
Basophils Relative: 0.3 % (ref 0.0–3.0)
Eosinophils Absolute: 0 10*3/uL (ref 0.0–0.7)
Eosinophils Relative: 0.5 % (ref 0.0–5.0)
HCT: 42.8 % (ref 39.0–52.0)
Hemoglobin: 14.3 g/dL (ref 13.0–17.0)
Lymphocytes Relative: 25.7 % (ref 12.0–46.0)
Lymphs Abs: 2.1 10*3/uL (ref 0.7–4.0)
MCHC: 33.5 g/dL (ref 30.0–36.0)
MCV: 94.9 fl (ref 78.0–100.0)
Monocytes Absolute: 0.6 10*3/uL (ref 0.1–1.0)
Monocytes Relative: 7.6 % (ref 3.0–12.0)
Neutro Abs: 5.4 10*3/uL (ref 1.4–7.7)
Neutrophils Relative %: 65.9 % (ref 43.0–77.0)
Platelets: 197 10*3/uL (ref 150.0–400.0)
RBC: 4.51 Mil/uL (ref 4.22–5.81)
RDW: 12.6 % (ref 11.5–15.5)
WBC: 8.2 10*3/uL (ref 4.0–10.5)

## 2019-07-14 LAB — LIPID PANEL
Cholesterol: 136 mg/dL (ref 0–200)
HDL: 51.4 mg/dL (ref >39.00)
LDL Cholesterol: 65 mg/dL (ref 0–99)
NonHDL: 85.03
Total CHOL/HDL Ratio: 3
Triglycerides: 99 mg/dL (ref 0.0–149.0)
VLDL: 19.8 mg/dL (ref 0.0–40.0)

## 2019-07-14 LAB — HEPATIC FUNCTION PANEL
ALT: 15 U/L (ref 0–53)
AST: 23 U/L (ref 0–37)
Albumin: 4.1 g/dL (ref 3.5–5.2)
Alkaline Phosphatase: 83 U/L (ref 39–117)
Bilirubin, Direct: 0.1 mg/dL (ref 0.0–0.3)
Total Bilirubin: 0.6 mg/dL (ref 0.2–1.2)
Total Protein: 7.5 g/dL (ref 6.0–8.3)

## 2019-07-14 LAB — HEMOGLOBIN A1C: Hgb A1c MFr Bld: 5.9 % (ref 4.6–6.5)

## 2019-07-14 LAB — PSA: PSA: 2.58 ng/mL (ref 0.10–4.00)

## 2019-07-14 LAB — TSH: TSH: 0.37 u[IU]/mL (ref 0.35–4.50)

## 2019-07-14 MED ORDER — ALBUTEROL SULFATE HFA 108 (90 BASE) MCG/ACT IN AERS: 1.0000 | INHALATION_SPRAY | Freq: Four times a day (QID) | RESPIRATORY_TRACT | 11 refills | Status: DC | PRN

## 2019-07-14 NOTE — Progress Notes (Signed)
Subjective:    Patient ID: Jack [name], male    DOB: 1946-04-24, 74 y.o.   MRN: [id number]  HPI  Here for wellness and f/u;  Overall doing ok;  Pt denies Chest pain, worsening SOB, DOE, wheezing, orthopnea, PND, worsening LE edema, palpitations, dizziness or syncope.  Pt denies neurological change such as new headache, facial or extremity weakness.  Pt denies polydipsia, polyuria, or low sugar symptoms. Pt states overall good compliance with treatment and medications, good tolerability, and has been trying to follow appropriate diet.  Pt denies worsening depressive symptoms, suicidal ideation or panic. No fever, night sweats, wt loss, loss of appetite, or other constitutional symptoms.  Pt states good ability with ADL's, has low fall risk, home safety reviewed and adequate, no other significant changes in hearing or vision, and only occasionally active with exercise.  No new complaints Past Medical History:  Diagnosis Date  . ALLERGIC RHINITIS 02/16/2007  . 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   . BACK PAIN 02/16/2007  . COPD (chronic obstructive pulmonary disease) ([place])   . Diverticulosis 09/27/2013  . FOOT PAIN, CHRONIC 02/27/2008  . Hepatitis C antibody test positive   . 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 HYPERLIPIDEMIA 02/16/2007  . HYPERTENSION 02/16/2007  . Personal history of colonic polyps 03/08/2007  . PROSTATE SPECIFIC ANTIGEN, ELEVATED 02/16/2007  . Shortness of breath dyspnea    Past Surgical History:  Procedure Laterality Date  . FOOT SURGERY     reconstruction x's 4 after work accident    reports that he quit smoking about 35 years ago. His smoking use included cigarettes. He has a 7.50 pack-year smoking history. He has never used smokeless tobacco. He reports that he does not drink alcohol or use drugs. family history includes Alcohol abuse in his brother; Cancer in his mother and sister; Coronary artery disease in his father; Hypertension in his father. No Known Allergies Current  Outpatient Medications on File Prior to Visit  Medication Sig Dispense Refill  . atenolol (TENORMIN) 25 MG tablet Take 1 tablet (25 mg total) by mouth daily. 90 tablet 3  . cetirizine (ZYRTEC) 10 MG tablet TAKE 1 TABLET BY MOUTH EVERY DAY 90 tablet 3  . citalopram (CELEXA) 10 MG tablet Take 1 tablet (10 mg total) by mouth daily. 90 tablet 3  . clonazePAM (KLONOPIN) 0.5 MG tablet TAKE 1 TABLET BY MOUTH TWICE A DAY AS NEEDED FOR ANXIETY 60 tablet 2  . famotidine (PEPCID) 20 MG tablet TAKE 1 TABLET BY MOUTH EVERYDAY AT BEDTIME 90 tablet 1  . finasteride (PROSCAR) 5 MG tablet Take 1 tablet (5 mg total) by mouth daily. 90 tablet 3  . levocetirizine (XYZAL) 5 MG tablet Take 1 tablet (5 mg total) by mouth daily as needed for allergies. 90 tablet 1  . losartan-hydrochlorothiazide (HYZAAR) 50-12.5 MG tablet TAKE 1 TABLET BY MOUTH EVERY DAY 90 tablet 3  . methimazole (TAPAZOLE) 5 MG tablet TAKE 1 TABLET (5 MG TOTAL) BY MOUTH 3 (THREE) TIMES A WEEK. 36 tablet 1  . pantoprazole (PROTONIX) 40 MG tablet TAKE 1 TABLET BY MOUTH EVERY DAY 90 tablet 3  . rosuvastatin (CRESTOR) 40 MG tablet TAKE 1 TABLET BY MOUTH EVERY DAY 90 tablet 3  . simvastatin (ZOCOR) 40 MG tablet TAKE 1 TABLET BY MOUTH EVERY DAY 90 tablet 1  . tiZANidine (ZANAFLEX) 4 MG tablet Take 1 tablet (4 mg total) by mouth every 6 (six) hours as needed for muscle spasms. 40 tablet 1  .  triamcinolone (NASACORT) 55 MCG/ACT AERO nasal inhaler Place 2 sprays into the nose daily. 3 Inhaler 3   No current facility-administered medications on file prior to visit.   Review of Systems All otherwise neg per pt     Objective:   Physical Exam BP 132/82 (BP Location: Left Arm, Patient Position: Sitting, Cuff Size: Large)   Pulse 70   Temp 98.3 F (36.8 C) (Oral)   Ht 5\' 4"  (1.626 m)   Wt 202 lb (91.6 kg)   SpO2 96%   BMI 34.67 kg/m  VS noted,  Constitutional: Pt appears in NAD HENT: Head: NCAT.  Right Ear: External ear normal.  Left Ear: External  ear normal.  Eyes: . Pupils are equal, round, and reactive to light. Conjunctivae and EOM are normal Nose: without d/c or deformity Neck: Neck supple. Gross normal ROM Cardiovascular: Normal rate and regular rhythm.   Pulmonary/Chest: Effort normal and breath sounds without rales or wheezing.  Abd:  Soft, NT, ND, + BS, no organomegaly Neurological: Pt is alert. At baseline orientation, motor grossly intact Skin: Skin is warm. No rashes, other new lesions, no LE edema Psychiatric: Pt behavior is normal without agitation  All otherwise neg per pt Lab Results  Component Value Date   WBC 8.2 07/14/2019   HGB 14.3 07/14/2019   HCT 42.8 07/14/2019   PLT 197.0 07/14/2019   GLUCOSE 102 (H) 07/14/2019   CHOL 136 07/14/2019   TRIG 99.0 07/14/2019   HDL 51.40 07/14/2019   LDLDIRECT 89.0 12/06/2017   LDLCALC 65 07/14/2019   ALT 15 07/14/2019   AST 23 07/14/2019   NA 137 07/14/2019   K 4.0 07/14/2019   CL 102 07/14/2019   CREATININE 1.30 07/14/2019   BUN 18 07/14/2019   CO2 30 07/14/2019   TSH 0.37 07/14/2019   PSA 2.58 07/14/2019   HGBA1C 5.9 07/14/2019      Assessment & Plan:

## 2019-07-14 NOTE — Patient Instructions (Signed)

## 2019-07-16 ENCOUNTER — Encounter: Payer: Self-pay | Admitting: Internal Medicine

## 2019-07-16 NOTE — Assessment & Plan Note (Signed)
stable overall by history and exam, recent data reviewed with pt, and pt to continue medical treatment as before,  to f/u any worsening symptoms or concerns  

## 2019-07-16 NOTE — Assessment & Plan Note (Signed)

## 2019-08-18 ENCOUNTER — Other Ambulatory Visit: Payer: Self-pay

## 2019-08-18 ENCOUNTER — Encounter: Payer: Self-pay | Admitting: Internal Medicine

## 2019-08-18 ENCOUNTER — Ambulatory Visit (INDEPENDENT_AMBULATORY_CARE_PROVIDER_SITE_OTHER): Payer: Medicare Other | Admitting: Internal Medicine

## 2019-08-18 VITALS — BP 150/80 | HR 73 | Temp 98.4°F | Ht 64.0 in | Wt 194.0 lb

## 2019-08-18 DIAGNOSIS — R109 Unspecified abdominal pain: Secondary | ICD-10-CM

## 2019-08-18 DIAGNOSIS — I10 Essential (primary) hypertension: Secondary | ICD-10-CM

## 2019-08-18 DIAGNOSIS — R739 Hyperglycemia, unspecified: Secondary | ICD-10-CM | POA: Diagnosis not present

## 2019-08-18 LAB — URINALYSIS, ROUTINE W REFLEX MICROSCOPIC
Bilirubin Urine: NEGATIVE
Hgb urine dipstick: NEGATIVE
Ketones, ur: NEGATIVE
Leukocytes,Ua: NEGATIVE
Nitrite: NEGATIVE
Specific Gravity, Urine: >=1.03 — AB (ref 1.000–1.030)
Total Protein, Urine: NEGATIVE
Urine Glucose: NEGATIVE
Urobilinogen, UA: 1 (ref 0.0–1.0)
pH: 6 (ref 5.0–8.0)

## 2019-08-18 LAB — CBC WITH DIFFERENTIAL/PLATELET
Basophils Absolute: 0 10*3/uL (ref 0.0–0.1)
Basophils Relative: 0.4 % (ref 0.0–3.0)
Eosinophils Absolute: 0.1 10*3/uL (ref 0.0–0.7)
Eosinophils Relative: 0.8 % (ref 0.0–5.0)
HCT: 41.3 % (ref 39.0–52.0)
Hemoglobin: 14.2 g/dL (ref 13.0–17.0)
Lymphocytes Relative: 25.5 % (ref 12.0–46.0)
Lymphs Abs: 2.4 10*3/uL (ref 0.7–4.0)
MCHC: 34.3 g/dL (ref 30.0–36.0)
MCV: 94.4 fl (ref 78.0–100.0)
Monocytes Absolute: 0.8 10*3/uL (ref 0.1–1.0)
Monocytes Relative: 8.6 % (ref 3.0–12.0)
Neutro Abs: 6 10*3/uL (ref 1.4–7.7)
Neutrophils Relative %: 64.7 % (ref 43.0–77.0)
Platelets: 212 10*3/uL (ref 150.0–400.0)
RBC: 4.37 Mil/uL (ref 4.22–5.81)
RDW: 12.9 % (ref 11.5–15.5)
WBC: 9.2 10*3/uL (ref 4.0–10.5)

## 2019-08-18 LAB — BASIC METABOLIC PANEL
BUN: 21 mg/dL (ref 6–23)
CO2: 31 mEq/L (ref 19–32)
Calcium: 9.9 mg/dL (ref 8.4–10.5)
Chloride: 101 mEq/L (ref 96–112)
Creatinine, Ser: 1.36 mg/dL (ref 0.40–1.50)
GFR: 62 mL/min (ref >60.00)
Glucose, Bld: 111 mg/dL — ABNORMAL HIGH (ref 70–99)
Potassium: 3.2 mEq/L — ABNORMAL LOW (ref 3.5–5.1)
Sodium: 138 mEq/L (ref 135–145)

## 2019-08-18 LAB — HEPATIC FUNCTION PANEL
ALT: 14 U/L (ref 0–53)
AST: 29 U/L (ref 0–37)
Albumin: 4.3 g/dL (ref 3.5–5.2)
Alkaline Phosphatase: 83 U/L (ref 39–117)
Bilirubin, Direct: 0.1 mg/dL (ref 0.0–0.3)
Total Bilirubin: 0.6 mg/dL (ref 0.2–1.2)
Total Protein: 7.9 g/dL (ref 6.0–8.3)

## 2019-08-18 LAB — LIPASE: Lipase: 39 U/L (ref 11.0–59.0)

## 2019-08-18 MED ORDER — POLYETHYLENE GLYCOL 3350 17 GM/SCOOP PO POWD: 17.0000 g | Freq: Every day | ORAL | 1 refills | Status: AC

## 2019-08-18 MED ORDER — LACTULOSE 10 GM/15ML PO SOLN: 30.0000 g | Freq: Every day | ORAL | 1 refills | Status: DC | PRN

## 2019-08-18 NOTE — Progress Notes (Signed)
Subjective:    Patient ID: Jack [name], male    DOB: [birth year], 74 y.o.   MRN: [id number]  HPI  Here with 2 days onset mod constant worsening upper abd discomfort with gas pain, nausea, distensions but Denies worsening reflux, dysphagia, vomiting, diarrhea or blood.  Gas x otc has not helped.  Nothing else makes better or worse.  Pt denies chest pain, increased sob or doe, wheezing, orthopnea, PND, increased LE swelling, palpitations, dizziness or syncope.  Pt denies new neurological symptoms such as new headache, or facial or extremity weakness or numbness   Pt denies polydipsia, polyuria.  bP at home < 140/90 Past Medical History:  Diagnosis Date  . ALLERGIC RHINITIS 02/16/2007  . 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   . BACK PAIN 02/16/2007  . COPD (chronic obstructive pulmonary disease) ([place])   . Diverticulosis 09/27/2013  . FOOT PAIN, CHRONIC 02/27/2008  . Hepatitis C antibody test positive   . 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 HYPERLIPIDEMIA 02/16/2007  . HYPERTENSION 02/16/2007  . Personal history of colonic polyps 03/08/2007  . PROSTATE SPECIFIC ANTIGEN, ELEVATED 02/16/2007  . Shortness of breath dyspnea    Past Surgical History:  Procedure Laterality Date  . FOOT SURGERY     reconstruction x's 4 after work accident    reports that he quit smoking about 35 years ago. His smoking use included cigarettes. He has a 7.50 pack-year smoking history. He has never used smokeless tobacco. He reports that he does not drink alcohol or use drugs. family history includes Alcohol abuse in his brother; Cancer in his mother and sister; Coronary artery disease in his father; Hypertension in his father. No Known Allergies Current Outpatient Medications on File Prior to Visit  Medication Sig Dispense Refill  . albuterol (VENTOLIN HFA) 108 (90 Base) MCG/ACT inhaler Inhale 1-2 puffs into the lungs every 6 (six) hours as needed for wheezing or shortness of breath. 54 g 11  . atenolol (TENORMIN) 25 MG tablet  Take 1 tablet (25 mg total) by mouth daily. 90 tablet 3  . cetirizine (ZYRTEC) 10 MG tablet TAKE 1 TABLET BY MOUTH EVERY DAY 90 tablet 3  . citalopram (CELEXA) 10 MG tablet Take 1 tablet (10 mg total) by mouth daily. 90 tablet 3  . clonazePAM (KLONOPIN) 0.5 MG tablet TAKE 1 TABLET BY MOUTH TWICE A DAY AS NEEDED FOR ANXIETY 60 tablet 2  . famotidine (PEPCID) 20 MG tablet TAKE 1 TABLET BY MOUTH EVERYDAY AT BEDTIME 90 tablet 1  . finasteride (PROSCAR) 5 MG tablet Take 1 tablet (5 mg total) by mouth daily. 90 tablet 3  . FLUAD QUADRIVALENT 0.5 ML injection     . levocetirizine (XYZAL) 5 MG tablet Take 1 tablet (5 mg total) by mouth daily as needed for allergies. 90 tablet 1  . losartan-hydrochlorothiazide (HYZAAR) 50-12.5 MG tablet TAKE 1 TABLET BY MOUTH EVERY DAY 90 tablet 3  . methimazole (TAPAZOLE) 5 MG tablet TAKE 1 TABLET (5 MG TOTAL) BY MOUTH 3 (THREE) TIMES A WEEK. 36 tablet 1  . pantoprazole (PROTONIX) 40 MG tablet TAKE 1 TABLET BY MOUTH EVERY DAY 90 tablet 3  . rosuvastatin (CRESTOR) 40 MG tablet TAKE 1 TABLET BY MOUTH EVERY DAY 90 tablet 3  . simvastatin (ZOCOR) 40 MG tablet TAKE 1 TABLET BY MOUTH EVERY DAY 90 tablet 1  . tiZANidine (ZANAFLEX) 4 MG tablet Take 1 tablet (4 mg total) by mouth every 6 (six) hours as needed for muscle spasms. 40 tablet  1  . triamcinolone (NASACORT) 55 MCG/ACT AERO nasal inhaler Place 2 sprays into the nose daily. 3 Inhaler 3   No current facility-administered medications on file prior to visit.   Review of Systems All otherwise neg per pt     Objective:   Physical Exam BP (!) 150/80 (BP Location: Left Arm, Patient Position: Sitting, Cuff Size: Large)   Pulse 73   Temp 98.4 F (36.9 C) (Oral)   Ht 5\' 4"  (1.626 m)   Wt 194 lb (88 kg)   SpO2 95%   BMI 33.30 kg/m  VS noted,  Constitutional: Pt appears in NAD HENT: Head: NCAT.  Right Ear: External ear normal.  Left Ear: External ear normal.  Eyes: . Pupils are equal, round, and reactive to light.  Conjunctivae and EOM are normal Nose: without d/c or deformity Neck: Neck supple. Gross normal ROM Cardiovascular: Normal rate and regular rhythm.   Pulmonary/Chest: Effort normal and breath sounds without rales or wheezing.  Abd:  Soft, diffuse mild tender, mild distended, +BS, no hsM Neurological: Pt is alert. At baseline orientation, motor grossly intact Skin: Skin is warm. No rashes, other new lesions, no LE edema Psychiatric: Pt behavior is normal without agitation  All otherwise neg per pt Lab Results  Component Value Date   WBC 9.2 08/18/2019   HGB 14.2 08/18/2019   HCT 41.3 08/18/2019   PLT 212.0 08/18/2019   GLUCOSE 111 (H) 08/18/2019   CHOL 136 07/14/2019   TRIG 99.0 07/14/2019   HDL 51.40 07/14/2019   LDLDIRECT 89.0 12/06/2017   LDLCALC 65 07/14/2019   ALT 14 08/18/2019   AST 29 08/18/2019   NA 138 08/18/2019   K 3.2 (L) 08/18/2019   CL 101 08/18/2019   CREATININE 1.36 08/18/2019   BUN 21 08/18/2019   CO2 31 08/18/2019   TSH 0.37 07/14/2019   PSA 2.58 07/14/2019   HGBA1C 5.9 07/14/2019       Assessment & Plan:

## 2019-08-18 NOTE — Patient Instructions (Signed)
Please take all new medication as prescribed - the generic for miralax every day until better, and the lactulose in addition as well if needed for constipation  Please continue all other medications as before, and refills have been done if requested.  Please have the pharmacy call with any other refills you may need.  Please continue your efforts at being more active, low cholesterol diet, and weight control.  Please keep your appointments with your specialists as you may have planned  Please go to the LAB at the blood drawing area for the tests to be done  You will be contacted by phone if any changes need to be made immediately.  Otherwise, you will receive a letter about your results with an explanation, but please check with MyChart first.  Please remember to sign up for MyChart if you have not done so, as this will be important to you in the future with finding out test results, communicating by private email, and scheduling acute appointments online when needed.

## 2019-08-19 ENCOUNTER — Encounter: Payer: Self-pay | Admitting: Internal Medicine

## 2019-08-19 NOTE — Assessment & Plan Note (Signed)
stable overall by history and exam, recent data reviewed with pt, and pt to continue medical treatment as before,  to f/u any worsening symptoms or concerns  

## 2019-08-19 NOTE — Assessment & Plan Note (Signed)
Mild elevated, likely reactive,  to f/u any worsening symptoms or concerns

## 2019-08-19 NOTE — Assessment & Plan Note (Addendum)
Exam most c/w constipation, declines film today, for labs as orderd, miralax asd, and lactulose prn,  to f/u any worsening symptoms or concerns  I spent 31 minutes in preparing to see the patient by review of recent labs, imaging and procedures, obtaining and reviewing separately obtained history, communicating with the patient and family or caregiver, ordering medications, tests or procedures, and documenting clinical information in the EHR including the differential Dx, treatment, and any further evaluation and other management of abd pain, hyperglycemia, htn

## 2019-09-20 ENCOUNTER — Other Ambulatory Visit: Payer: Self-pay | Admitting: Internal Medicine

## 2019-10-14 ENCOUNTER — Other Ambulatory Visit: Payer: Self-pay | Admitting: Internal Medicine

## 2019-10-14 NOTE — Telephone Encounter (Signed)
Please refill as per office routine med refill policy (all routine meds refilled for 3 mo or monthly per pt preference up to one year from last visit, then month to month grace period for 3 mo, then further med refills will have to be denied)  

## 2019-10-24 ENCOUNTER — Telehealth: Payer: Self-pay

## 2019-10-24 ENCOUNTER — Other Ambulatory Visit: Payer: Self-pay

## 2019-10-24 MED ORDER — TRIAMCINOLONE ACETONIDE 55 MCG/ACT NA AERO: 2.0000 | INHALATION_SPRAY | Freq: Every day | NASAL | 3 refills | Status: DC

## 2019-10-24 NOTE — Telephone Encounter (Signed)
Sent in

## 2019-10-24 NOTE — Telephone Encounter (Signed)
1.Medication Requested:triamcinolone (NASACORT) 55 MCG/ACT AERO nasal inhaler  2. Pharmacy (Name, [place], [place]):[id number] - [place], [address]  3. On Med List: yes   4. Last Visit with PCP: 4.23.21  5. Next visit date with PCP: 9.20.21    Agent: Please be advised that RX refills may take up to 3 business days. We ask that you follow-up with your pharmacy.

## 2019-11-19 ENCOUNTER — Other Ambulatory Visit: Payer: Self-pay | Admitting: Internal Medicine

## 2019-11-19 NOTE — Telephone Encounter (Signed)
Please refill as per office routine med refill policy (all routine meds refilled for 3 mo or monthly per pt preference up to one year from last visit, then month to month grace period for 3 mo, then further med refills will have to be denied)  

## 2019-11-28 ENCOUNTER — Other Ambulatory Visit: Payer: Self-pay | Admitting: Internal Medicine

## 2019-12-14 ENCOUNTER — Other Ambulatory Visit: Payer: Self-pay | Admitting: Internal Medicine

## 2019-12-14 NOTE — Telephone Encounter (Signed)
Please refill as per office routine med refill policy (all routine meds refilled for 3 mo or monthly per pt preference up to one year from last visit, then month to month grace period for 3 mo, then further med refills will have to be denied)  

## 2019-12-16 ENCOUNTER — Other Ambulatory Visit: Payer: Self-pay | Admitting: Internal Medicine

## 2019-12-16 NOTE — Telephone Encounter (Signed)
Please refill as per office routine med refill policy (all routine meds refilled for 3 mo or monthly per pt preference up to one year from last visit, then month to month grace period for 3 mo, then further med refills will have to be denied)  

## 2019-12-19 ENCOUNTER — Telehealth: Payer: Medicare Other | Admitting: Internal Medicine

## 2019-12-19 DIAGNOSIS — R0981 Nasal congestion: Secondary | ICD-10-CM

## 2019-12-26 NOTE — Progress Notes (Signed)
No show Called - no answer

## 2020-01-08 ENCOUNTER — Other Ambulatory Visit: Payer: Self-pay | Admitting: Internal Medicine

## 2020-01-15 ENCOUNTER — Ambulatory Visit: Payer: Medicare Other | Admitting: Internal Medicine

## 2020-01-26 ENCOUNTER — Encounter: Payer: Self-pay | Admitting: Internal Medicine

## 2020-01-26 ENCOUNTER — Ambulatory Visit (INDEPENDENT_AMBULATORY_CARE_PROVIDER_SITE_OTHER): Payer: Medicare Other | Admitting: Internal Medicine

## 2020-01-26 ENCOUNTER — Other Ambulatory Visit: Payer: Self-pay

## 2020-01-26 VITALS — BP 148/80 | HR 83 | Temp 98.4°F | Ht 64.0 in | Wt 185.0 lb

## 2020-01-26 DIAGNOSIS — Z23 Encounter for immunization: Secondary | ICD-10-CM

## 2020-01-26 DIAGNOSIS — I10 Essential (primary) hypertension: Secondary | ICD-10-CM

## 2020-01-26 DIAGNOSIS — J309 Allergic rhinitis, unspecified: Secondary | ICD-10-CM

## 2020-01-26 DIAGNOSIS — K219 Gastro-esophageal reflux disease without esophagitis: Secondary | ICD-10-CM

## 2020-01-26 DIAGNOSIS — R739 Hyperglycemia, unspecified: Secondary | ICD-10-CM

## 2020-01-26 LAB — POCT GLYCOSYLATED HEMOGLOBIN (HGB A1C)
HbA1c POC (<> result, manual entry): 5.6 % (ref 4.0–5.6)
HbA1c, POC (controlled diabetic range): 5.6 % (ref 0.0–7.0)
HbA1c, POC (prediabetic range): 5.6 % — AB (ref 5.7–6.4)
Hemoglobin A1C: 5.6 % (ref 4.0–5.6)

## 2020-01-26 MED ORDER — METHYLPREDNISOLONE ACETATE 80 MG/ML IJ SUSP
80.0000 mg | Freq: Once | INTRAMUSCULAR | Status: AC
  Administered 2020-01-26: 80 mg via INTRAMUSCULAR

## 2020-01-26 MED ORDER — PANTOPRAZOLE SODIUM 40 MG PO TBEC: 40.0000 mg | DELAYED_RELEASE_TABLET | Freq: Two times a day (BID) | ORAL | 3 refills | Status: DC

## 2020-01-26 NOTE — Patient Instructions (Addendum)
Ok to increase the protonix to twice per day  You had the steroid shot today;  Please call for ENT referral if this does not help clear up the chronic left side feeling of sinus blockage  Your A1c for sugar was OK today at 5,1  Please continue all other medications as before, and refills have been done if requested.  Please have the pharmacy call with any other refills you may need.  Please continue your efforts at being more active, low cholesterol diet, and weight control..  Please keep your appointments with your specialists as you may have planned  Please make an Appointment to return in 6 months, or sooner if needed

## 2020-01-26 NOTE — Progress Notes (Signed)
Subjective:    Patient ID: Jack [name], male    DOB: 1945/11/07, 74 y.o.   MRN: [id number]  HPI  Here to f/u; overall doing ok,  Pt denies chest pain, increasing sob or doe, wheezing, orthopnea, PND, increased LE swelling, palpitations, dizziness or syncope.  Pt denies new neurological symptoms such as new headache, or facial or extremity weakness or numbness.  Pt denies polydipsia, polyuria, or low sugar episode.  Pt states overall good compliance with meds, mostly trying to follow appropriate diet, with wt overall stable,  but little exercise however.  Has had mild worsening reflux, but no abd pain, dysphagia, n/v, bowel change or blood.  Also Does have several wks ongoing nasal allergy symptoms with clearish congestion, itch and sneezing, without fever, pain, ST, cough, swelling or wheezing. Past Medical History:  Diagnosis Date  . ALLERGIC RHINITIS 02/16/2007  . 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   . BACK PAIN 02/16/2007  . COPD (chronic obstructive pulmonary disease) ([place])   . Diverticulosis 09/27/2013  . FOOT PAIN, CHRONIC 02/27/2008  . Hepatitis C antibody test positive   . 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 HYPERLIPIDEMIA 02/16/2007  . HYPERTENSION 02/16/2007  . Personal history of colonic polyps 03/08/2007  . PROSTATE SPECIFIC ANTIGEN, ELEVATED 02/16/2007  . Shortness of breath dyspnea    Past Surgical History:  Procedure Laterality Date  . FOOT SURGERY     reconstruction x's 4 after work accident    reports that he quit smoking about 35 years ago. His smoking use included cigarettes. He has a 7.50 pack-year smoking history. He has never used smokeless tobacco. He reports that he does not drink alcohol and does not use drugs. family history includes Alcohol abuse in his brother; Cancer in his mother and sister; Coronary artery disease in his father; Hypertension in his father. No Known Allergies Current Outpatient Medications on File Prior to Visit  Medication Sig Dispense Refill  .  albuterol (VENTOLIN HFA) 108 (90 Base) MCG/ACT inhaler Inhale 1-2 puffs into the lungs every 6 (six) hours as needed for wheezing or shortness of breath. 54 g 11  . atenolol (TENORMIN) 25 MG tablet Take 1 tablet (25 mg total) by mouth daily. 90 tablet 3  . cetirizine (ZYRTEC) 10 MG tablet TAKE 1 TABLET BY MOUTH EVERY DAY 90 tablet 3  . citalopram (CELEXA) 10 MG tablet TAKE 1 TABLET BY MOUTH EVERY DAY 90 tablet 3  . clonazePAM (KLONOPIN) 0.5 MG tablet TAKE 1 TABLET BY MOUTH TWICE A DAY AS NEEDED FOR ANXIETY 60 tablet 2  . famotidine (PEPCID) 20 MG tablet TAKE 1 TABLET BY MOUTH EVERYDAY AT BEDTIME 90 tablet 1  . finasteride (PROSCAR) 5 MG tablet Take 1 tablet (5 mg total) by mouth daily. 90 tablet 3  . FLUAD QUADRIVALENT 0.5 ML injection     . lactulose (CHRONULAC) 10 GM/15ML solution Take 45 mLs (30 g total) by mouth daily as needed for mild constipation. 236 mL 1  . levocetirizine (XYZAL) 5 MG tablet Take 1 tablet (5 mg total) by mouth daily as needed for allergies. 90 tablet 1  . losartan-hydrochlorothiazide (HYZAAR) 50-12.5 MG tablet TAKE 1 TABLET BY MOUTH EVERY DAY 90 tablet 3  . methimazole (TAPAZOLE) 5 MG tablet TAKE 1 TABLET (5 MG TOTAL) BY MOUTH 3 (THREE) TIMES A WEEK. 36 tablet 1  . polyethylene glycol powder (GLYCOLAX/MIRALAX) 17 GM/SCOOP powder Take 17 g by mouth daily. 3350 g 1  . rosuvastatin (CRESTOR) 40 MG tablet TAKE  1 TABLET BY MOUTH EVERY DAY 90 tablet 3  . simvastatin (ZOCOR) 40 MG tablet TAKE 1 TABLET BY MOUTH EVERY DAY 90 tablet 1  . tiZANidine (ZANAFLEX) 4 MG tablet Take 1 tablet (4 mg total) by mouth every 6 (six) hours as needed for muscle spasms. 40 tablet 1  . triamcinolone (NASACORT) 55 MCG/ACT AERO nasal inhaler Place 2 sprays into the nose daily. 3 Inhaler 3   No current facility-administered medications on file prior to visit.   Review of Systems All otherwise neg per pt    Objective:   Physical Exam BP (!) 148/80 (BP Location: Right Arm, Patient Position:  Sitting, Cuff Size: Normal)   Pulse 83   Temp 98.4 F (36.9 C) (Oral)   Ht 5\' 4"  (1.626 m)   Wt 185 lb (83.9 kg)   SpO2 97%   BMI 31.76 kg/m  VS noted,  Constitutional: Pt appears in NAD HENT: Head: NCAT.  Right Ear: External ear normal.  Left Ear: External ear normal.  Eyes: . Pupils are equal, round, and reactive to light. Conjunctivae and EOM are normal Nose: without d/c or deformity Neck: Neck supple. Gross normal ROM Cardiovascular: Normal rate and regular rhythm.   Pulmonary/Chest: Effort normal and breath sounds without rales or wheezing.  Abd:  Soft, NT, ND, + BS, no organomegaly Neurological: Pt is alert. At baseline orientation, motor grossly intact Skin: Skin is warm. No rashes, other new lesions, no LE edema Psychiatric: Pt behavior is normal without agitation  All otherwise neg per pt  Contains abnormal dataPOCT glycosylated hemoglobin (Hb A1C) Order: [id number] Status:  Final result Visible to patient:  No (inaccessible in MyChart) Dx:  Hyperglycemia  0 Result Notes  Ref Range & Units 17:07 6 mo ago 1 yr ago 3 yr ago  Hemoglobin A1C 4.0 - 5.6 % 5.6  5.9 R, CM  6.2 R, CM  5.8 R, CM   HbA1c POC (<> result, manual entry) 4.0 - 5.6 % 5.6      HbA1c, POC (prediabetic range) 5.7 - 6.4 % 5.6Abnormal               Assessment & Plan:

## 2020-01-27 ENCOUNTER — Encounter: Payer: Self-pay | Admitting: Internal Medicine

## 2020-01-27 DIAGNOSIS — K219 Gastro-esophageal reflux disease without esophagitis: Secondary | ICD-10-CM | POA: Insufficient documentation

## 2020-01-27 NOTE — Assessment & Plan Note (Signed)
stable overall by history and exam, recent data reviewed with pt, and pt to continue medical treatment as before,  to f/u any worsening symptoms or concerns  

## 2020-01-27 NOTE — Assessment & Plan Note (Signed)
Uncontrolled, for inreased PPI BID,  to f/u any worsening symptoms or concerns

## 2020-01-27 NOTE — Assessment & Plan Note (Addendum)
likely seasonal flare despite good outpt med compliance, for depomedrol IM 80, cont home med tx  I spent 31 minutes in preparing to see the patient by review of recent labs, imaging and procedures, obtaining and reviewing separately obtained history, communicating with the patient and family or caregiver, ordering medications, tests or procedures, and documenting clinical information in the EHR including the differential Dx, treatment, and any further evaluation and other management of allergies, gerd, hyperglycemia, htn,

## 2020-02-26 ENCOUNTER — Telehealth: Payer: Self-pay | Admitting: Internal Medicine

## 2020-02-26 NOTE — Telephone Encounter (Signed)
simvastatin (ZOCOR) 40 MG tablet [id number] [place], [place] - [address] Phone:  [phone]  Fax:  [phone]     Patient needs a refill

## 2020-02-27 ENCOUNTER — Other Ambulatory Visit: Payer: Self-pay

## 2020-02-27 MED ORDER — SIMVASTATIN 40 MG PO TABS
40.0000 mg | ORAL_TABLET | Freq: Every day | ORAL | 1 refills | Status: DC
Start: 2020-02-27 — End: 2020-07-23

## 2020-05-02 ENCOUNTER — Telehealth: Payer: Self-pay | Admitting: Internal Medicine

## 2020-05-02 ENCOUNTER — Other Ambulatory Visit: Payer: Self-pay

## 2020-05-02 MED ORDER — TIZANIDINE HCL 4 MG PO TABS: 4.0000 mg | ORAL_TABLET | Freq: Four times a day (QID) | ORAL | 1 refills | Status: DC | PRN

## 2020-05-02 NOTE — Telephone Encounter (Signed)
tiZANidine (ZANAFLEX) 4 MG tablet [id number] [place], [place] - [address] Phone:  [phone]  Fax:  [phone]     Last seen-10.01.21 Next apt- n/a Requesting a refill

## 2020-05-20 ENCOUNTER — Other Ambulatory Visit: Payer: Self-pay | Admitting: Internal Medicine

## 2020-05-20 NOTE — Telephone Encounter (Signed)
Please refill as per office routine med refill policy (all routine meds refilled for 3 mo or monthly per pt preference up to one year from last visit, then month to month grace period for 3 mo, then further med refills will have to be denied)  

## 2020-05-24 ENCOUNTER — Telehealth: Payer: Self-pay

## 2020-05-24 ENCOUNTER — Other Ambulatory Visit: Payer: Self-pay | Admitting: Internal Medicine

## 2020-05-24 NOTE — Telephone Encounter (Signed)
Called pt at [phone], phone constantly rang Notify pt to call endocrinologist to fill methimazole

## 2020-05-24 NOTE — Telephone Encounter (Signed)
Ok to ask pt to reqeust refill from his endocrinologist for this medication

## 2020-07-11 ENCOUNTER — Other Ambulatory Visit: Payer: Self-pay | Admitting: Internal Medicine

## 2020-07-11 NOTE — Telephone Encounter (Signed)
Please refill as per office routine med refill policy (all routine meds refilled for 3 mo or monthly per pt preference up to one year from last visit, then month to month grace period for 3 mo, then further med refills will have to be denied)  

## 2020-07-15 ENCOUNTER — Encounter: Payer: Medicare Other | Admitting: Internal Medicine

## 2020-07-17 ENCOUNTER — Encounter: Payer: Self-pay | Admitting: Internal Medicine

## 2020-07-17 ENCOUNTER — Other Ambulatory Visit: Payer: Self-pay

## 2020-07-17 ENCOUNTER — Ambulatory Visit (INDEPENDENT_AMBULATORY_CARE_PROVIDER_SITE_OTHER): Payer: Medicare Other | Admitting: Internal Medicine

## 2020-07-17 VITALS — BP 130/76 | HR 81 | Temp 98.6°F | Ht 64.0 in | Wt 183.0 lb

## 2020-07-17 DIAGNOSIS — Z86010 Personal history of colonic polyps: Secondary | ICD-10-CM

## 2020-07-17 DIAGNOSIS — E559 Vitamin D deficiency, unspecified: Secondary | ICD-10-CM | POA: Insufficient documentation

## 2020-07-17 DIAGNOSIS — R739 Hyperglycemia, unspecified: Secondary | ICD-10-CM

## 2020-07-17 DIAGNOSIS — E538 Deficiency of other specified B group vitamins: Secondary | ICD-10-CM | POA: Diagnosis not present

## 2020-07-17 DIAGNOSIS — E7800 Pure hypercholesterolemia, unspecified: Secondary | ICD-10-CM | POA: Diagnosis not present

## 2020-07-17 DIAGNOSIS — Z0001 Encounter for general adult medical examination with abnormal findings: Secondary | ICD-10-CM | POA: Diagnosis not present

## 2020-07-17 DIAGNOSIS — J309 Allergic rhinitis, unspecified: Secondary | ICD-10-CM | POA: Diagnosis not present

## 2020-07-17 DIAGNOSIS — I10 Essential (primary) hypertension: Secondary | ICD-10-CM | POA: Diagnosis not present

## 2020-07-17 LAB — HEMOGLOBIN A1C: Hgb A1c MFr Bld: 5.9 % (ref 4.6–6.5)

## 2020-07-17 LAB — URINALYSIS, ROUTINE W REFLEX MICROSCOPIC
Bilirubin Urine: NEGATIVE
Hgb urine dipstick: NEGATIVE
Ketones, ur: NEGATIVE
Leukocytes,Ua: NEGATIVE
Nitrite: NEGATIVE
RBC / HPF: NONE SEEN (ref 0–?)
Specific Gravity, Urine: >=1.03 — AB (ref 1.000–1.030)
Total Protein, Urine: NEGATIVE
Urine Glucose: NEGATIVE
Urobilinogen, UA: 0.2 (ref 0.0–1.0)
WBC, UA: NONE SEEN (ref 0–?)
pH: 5.5 (ref 5.0–8.0)

## 2020-07-17 LAB — CBC WITH DIFFERENTIAL/PLATELET
Basophils Absolute: 0 10*3/uL (ref 0.0–0.1)
Basophils Relative: 0.4 % (ref 0.0–3.0)
Eosinophils Absolute: 0 10*3/uL (ref 0.0–0.7)
Eosinophils Relative: 0.5 % (ref 0.0–5.0)
HCT: 42.2 % (ref 39.0–52.0)
Hemoglobin: 14.5 g/dL (ref 13.0–17.0)
Lymphocytes Relative: 30.5 % (ref 12.0–46.0)
Lymphs Abs: 2.8 10*3/uL (ref 0.7–4.0)
MCHC: 34.5 g/dL (ref 30.0–36.0)
MCV: 94.8 fl (ref 78.0–100.0)
Monocytes Absolute: 0.8 10*3/uL (ref 0.1–1.0)
Monocytes Relative: 8.3 % (ref 3.0–12.0)
Neutro Abs: 5.5 10*3/uL (ref 1.4–7.7)
Neutrophils Relative %: 60.3 % (ref 43.0–77.0)
Platelets: 193 10*3/uL (ref 150.0–400.0)
RBC: 4.46 Mil/uL (ref 4.22–5.81)
RDW: 12.5 % (ref 11.5–15.5)
WBC: 9.2 10*3/uL (ref 4.0–10.5)

## 2020-07-17 LAB — BASIC METABOLIC PANEL
BUN: 23 mg/dL (ref 6–23)
CO2: 28 mEq/L (ref 19–32)
Calcium: 9.6 mg/dL (ref 8.4–10.5)
Chloride: 102 mEq/L (ref 96–112)
Creatinine, Ser: 1.4 mg/dL (ref 0.40–1.50)
GFR: 49.4 mL/min — ABNORMAL LOW (ref >60.00)
Glucose, Bld: 101 mg/dL — ABNORMAL HIGH (ref 70–99)
Potassium: 4 mEq/L (ref 3.5–5.1)
Sodium: 138 mEq/L (ref 135–145)

## 2020-07-17 LAB — HEPATIC FUNCTION PANEL
ALT: 13 U/L (ref 0–53)
AST: 22 U/L (ref 0–37)
Albumin: 4.5 g/dL (ref 3.5–5.2)
Alkaline Phosphatase: 80 U/L (ref 39–117)
Bilirubin, Direct: 0.1 mg/dL (ref 0.0–0.3)
Total Bilirubin: 0.7 mg/dL (ref 0.2–1.2)
Total Protein: 7.3 g/dL (ref 6.0–8.3)

## 2020-07-17 LAB — LIPID PANEL
Cholesterol: 170 mg/dL (ref 0–200)
HDL: 61.3 mg/dL (ref >39.00)
LDL Cholesterol: 84 mg/dL (ref 0–99)
NonHDL: 108.61
Total CHOL/HDL Ratio: 3
Triglycerides: 125 mg/dL (ref 0.0–149.0)
VLDL: 25 mg/dL (ref 0.0–40.0)

## 2020-07-17 LAB — TSH: TSH: 0.57 u[IU]/mL (ref 0.35–4.50)

## 2020-07-17 LAB — VITAMIN D 25 HYDROXY (VIT D DEFICIENCY, FRACTURES): VITD: 33.58 ng/mL (ref 30.00–100.00)

## 2020-07-17 LAB — VITAMIN B12: Vitamin B-12: 455 pg/mL (ref 211–911)

## 2020-07-17 LAB — PSA: PSA: 3.84 ng/mL (ref 0.10–4.00)

## 2020-07-17 MED ORDER — METHYLPREDNISOLONE ACETATE 80 MG/ML IJ SUSP
80.0000 mg | Freq: Once | INTRAMUSCULAR | Status: AC
  Administered 2020-07-17: 80 mg via INTRAMUSCULAR

## 2020-07-17 MED ORDER — PREDNISONE 10 MG PO TABS: ORAL_TABLET | ORAL | 0 refills | Status: DC

## 2020-07-17 NOTE — Patient Instructions (Signed)
You had the steroid shot today  Please take all new medication as prescribed - the prednisone for a short while for the allergies  Please continue all other medications as before, and refills have been done if requested.  Please have the pharmacy call with any other refills you may need.  Please continue your efforts at being more active, low cholesterol diet, and weight control.  You are otherwise up to date with prevention measures today.  Please keep your appointments with your specialists as you may have planned  You will be contacted regarding the referral for: Gastroenterology for the colonoscopy  Please go to the LAB at the blood drawing area for the tests to be done  You will be contacted by phone if any changes need to be made immediately.  Otherwise, you will receive a letter about your results with an explanation, but please check with MyChart first.  Please remember to sign up for MyChart if you have not done so, as this will be important to you in the future with finding out test results, communicating by private email, and scheduling acute appointments online when needed.  Please make an Appointment to return in 6 months, or sooner if needed

## 2020-07-17 NOTE — Assessment & Plan Note (Signed)
Lab Results  Component Value Date   LDLCALC 84 07/17/2020   Stable, pt to continue current statin crestor 40

## 2020-07-17 NOTE — Progress Notes (Signed)
Patient ID: Jack [name], male   DOB: 11/24/1945, 75 y.o.   MRN: [id number]         Chief Complaint:: wellness exam and allergies, hx of colon polyps, htn, hyperglycemia, hld       HPI:  Jack [name] is a 75 y.o. male here for wellness exam; due for colonoscopy, o/w up to date with preventive referrals and immunizations                        Also Does have several wks ongoing nasal allergy symptoms with clearish congestion, itch and sneezing, without fever, pain, ST, cough, swelling or wheezing.  Pt denies chest pain, increased sob or doe, wheezing, orthopnea, PND, increased LE swelling, palpitations, dizziness or syncope.  Denies worsening focal neuro s/s.  Marland Kitchen Pt denies polydipsia, polyuria   Pt denies fever, wt loss, night sweats, loss of appetite, or other constitutional symptoms       Wt Readings from Last 3 Encounters:  07/17/20 183 lb (83 kg)  01/26/20 185 lb (83.9 kg)  08/18/19 194 lb (88 kg)   BP Readings from Last 3 Encounters:  07/17/20 130/76  01/26/20 (!) 148/80  08/18/19 (!) 150/80   Immunization History  Administered Date(s) Administered  . Fluad Quad(high Dose 65+) 03/29/2019, 01/26/2020  . Influenza Whole 02/16/2007, 02/27/2008  . Influenza, High Dose Seasonal PF 03/09/2016, 02/26/2018, 06/29/2018  . Influenza,inj,Quad PF,6+ Mos 05/17/2014  . PFIZER(Purple Top)SARS-COV-2 Vaccination 06/12/2019, 07/03/2019, 02/10/2020  . Pneumococcal Conjugate-13 09/27/2013  . Pneumococcal Polysaccharide-23 02/27/2008, 10/04/2014  . Td 02/16/2007  . Tdap 12/03/2016   Health Maintenance Due  Topic Date Due  . COLONOSCOPY (Pts 45-70yrs Insurance coverage will need to be confirmed)  06/13/2020      Past Medical History:  Diagnosis Date  . ALLERGIC RHINITIS 02/16/2007  . 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   . BACK PAIN 02/16/2007  . COPD (chronic obstructive pulmonary disease) ([place])   . Diverticulosis 09/27/2013  . FOOT PAIN, CHRONIC 02/27/2008  . Hepatitis C antibody test positive   .  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 HYPERLIPIDEMIA 02/16/2007  . HYPERTENSION 02/16/2007  . Personal history of colonic polyps 03/08/2007  . PROSTATE SPECIFIC ANTIGEN, ELEVATED 02/16/2007  . Shortness of breath dyspnea    Past Surgical History:  Procedure Laterality Date  . FOOT SURGERY     reconstruction x's 4 after work accident    reports that he quit smoking about 36 years ago. His smoking use included cigarettes. He has a 7.50 pack-year smoking history. He has never used smokeless tobacco. He reports that he does not drink alcohol and does not use drugs. family history includes Alcohol abuse in his brother; Cancer in his mother and sister; Coronary artery disease in his father; Hypertension in his father. No Known Allergies Current Outpatient Medications on File Prior to Visit  Medication Sig Dispense Refill  . albuterol (VENTOLIN HFA) 108 (90 Base) MCG/ACT inhaler INHALE 1-2 PUFFS INTO THE LUNGS EVERY 6 (SIX) HOURS AS NEEDED FOR WHEEZING OR SHORTNESS OF BREATH. 25.5 each 11  . atenolol (TENORMIN) 25 MG tablet Take 1 tablet (25 mg total) by mouth daily. 90 tablet 3  . cetirizine (ZYRTEC) 10 MG tablet TAKE 1 TABLET BY MOUTH EVERY DAY 90 tablet 3  . citalopram (CELEXA) 10 MG tablet TAKE 1 TABLET BY MOUTH EVERY DAY 90 tablet 3  . clonazePAM (KLONOPIN) 0.5 MG tablet TAKE 1 TABLET BY MOUTH TWICE A DAY AS NEEDED  FOR ANXIETY 60 tablet 2  . famotidine (PEPCID) 20 MG tablet TAKE 1 TABLET BY MOUTH EVERYDAY AT BEDTIME 90 tablet 1  . finasteride (PROSCAR) 5 MG tablet Take 1 tablet (5 mg total) by mouth daily. 90 tablet 3  . FLUAD QUADRIVALENT 0.5 ML injection     . lactulose (CHRONULAC) 10 GM/15ML solution Take 45 mLs (30 g total) by mouth daily as needed for mild constipation. 236 mL 1  . levocetirizine (XYZAL) 5 MG tablet Take 1 tablet (5 mg total) by mouth daily as needed for allergies. 90 tablet 1  . losartan-hydrochlorothiazide (HYZAAR) 50-12.5 MG tablet TAKE 1 TABLET BY MOUTH EVERY DAY 90  tablet 3  . methimazole (TAPAZOLE) 5 MG tablet TAKE 1 TABLET (5 MG TOTAL) BY MOUTH 3 (THREE) TIMES A WEEK. 36 tablet 1  . pantoprazole (PROTONIX) 40 MG tablet Take 1 tablet (40 mg total) by mouth 2 (two) times daily. 180 tablet 3  . polyethylene glycol powder (GLYCOLAX/MIRALAX) 17 GM/SCOOP powder Take 17 g by mouth daily. 3350 g 1  . rosuvastatin (CRESTOR) 40 MG tablet TAKE 1 TABLET BY MOUTH EVERY DAY 90 tablet 3  . simvastatin (ZOCOR) 40 MG tablet Take 1 tablet (40 mg total) by mouth daily. 90 tablet 1  . tiZANidine (ZANAFLEX) 4 MG tablet Take 1 tablet (4 mg total) by mouth every 6 (six) hours as needed for muscle spasms. 40 tablet 1  . triamcinolone (NASACORT) 55 MCG/ACT AERO nasal inhaler Place 2 sprays into the nose daily. 3 Inhaler 3   No current facility-administered medications on file prior to visit.        ROS:  All others reviewed and negative.  Objective        PE:  BP 130/76   Pulse 81   Temp 98.6 F (37 C) (Oral)   Ht 5\' 4"  (1.626 m)   Wt 183 lb (83 kg)   SpO2 95%   BMI 31.41 kg/m                 Constitutional: Pt appears in NAD               HENT: Head: NCAT.                Right Ear: External ear normal.                 Left Ear: External ear normal.                Eyes: . Pupils are equal, round, and reactive to light. Conjunctivae and EOM are normal               Nose: without d/c or deformity               Neck: Neck supple. Gross normal ROM               Cardiovascular: Normal rate and regular rhythm.                 Pulmonary/Chest: Effort normal and breath sounds without rales or wheezing.                Abd:  Soft, NT, ND, + BS, no organomegaly               Neurological: Pt is alert. At baseline orientation, motor grossly intact               Skin: Skin is warm. No rashes, no other new  lesions, LE edema - none               Psychiatric: Pt behavior is normal without agitation   Micro: none  Cardiac tracings I have personally interpreted today:   none  Pertinent Radiological findings (summarize): none   Lab Results  Component Value Date   WBC 9.2 07/17/2020   HGB 14.5 07/17/2020   HCT 42.2 07/17/2020   PLT 193.0 07/17/2020   GLUCOSE 101 (H) 07/17/2020   CHOL 170 07/17/2020   TRIG 125.0 07/17/2020   HDL 61.30 07/17/2020   LDLDIRECT 89.0 12/06/2017   LDLCALC 84 07/17/2020   ALT 13 07/17/2020   AST 22 07/17/2020   NA 138 07/17/2020   K 4.0 07/17/2020   CL 102 07/17/2020   CREATININE 1.40 07/17/2020   BUN 23 07/17/2020   CO2 28 07/17/2020   TSH 0.57 07/17/2020   PSA 3.84 07/17/2020   HGBA1C 5.9 07/17/2020   Assessment/Plan:  Jack [name] is a 75 y.o. Black or African American [2] male with  has a past medical history of ALLERGIC RHINITIS (02/16/2007), Arthritis (2013), BACK PAIN (02/16/2007), COPD (chronic obstructive pulmonary disease) ([place]), Diverticulosis (09/27/2013), FOOT PAIN, CHRONIC (02/27/2008), Hepatitis C antibody test positive, HERPES ZOSTER, HX OF (02/27/2008), HYPERLIPIDEMIA (02/16/2007), HYPERTENSION (02/16/2007), Personal history of colonic polyps (03/08/2007), PROSTATE SPECIFIC ANTIGEN, ELEVATED (02/16/2007), and Shortness of breath dyspnea.  Encounter for well adult exam with abnormal findings Age and sex appropriate education and counseling updated with regular exercise and diet Referrals for preventative services - for colonoscopy Immunizations addressed - none needed Smoking counseling  - none needed Evidence for depression or other mood disorder - none significant Most recent labs reviewed. I have personally reviewed and have noted: 1) the patient's medical and social history 2) The patient's current medications and supplements 3) The patient's height, weight, and BMI have been recorded in the chart   Allergic rhinitis With seasonal flare worsening, for depomedrol im 80, predpac asd  Essential hypertension BP Readings from Last 3 Encounters:  07/17/20 130/76  01/26/20 (!) 148/80  08/18/19 (!)  150/80   Stable, pt to continue medical treatment tenormin, hyzaar   Hx of colonic polyps For colonoscopy as is due fu at 5 yrs  Hyperglycemia Lab Results  Component Value Date   HGBA1C 5.9 07/17/2020   Stable, pt to continue current medical treatment  - diet and wt control   Hyperlipidemia Lab Results  Component Value Date   LDLCALC 84 07/17/2020   Stable, pt to continue current statin crestor 40   Vitamin D deficiency Continue vit d3 supplement 2000 qd Last vitamin D Lab Results  Component Value Date   VD25OH 33.58 07/17/2020     Followup: Return in about 6 months (around 01/17/2021).  Cathlean Cower, MD 07/17/2020 9:01 PM [facility]

## 2020-07-17 NOTE — Assessment & Plan Note (Signed)

## 2020-07-17 NOTE — Assessment & Plan Note (Signed)
Lab Results  Component Value Date   HGBA1C 5.9 07/17/2020   Stable, pt to continue current medical treatment  - diet and wt control

## 2020-07-17 NOTE — Assessment & Plan Note (Signed)
For colonoscopy as is due fu at 5 yrs

## 2020-07-17 NOTE — Assessment & Plan Note (Signed)
With seasonal flare worsening, for depomedrol im 80, predpac asd

## 2020-07-17 NOTE — Assessment & Plan Note (Signed)
Continue vit d3 supplement 2000 qd Last vitamin D Lab Results  Component Value Date   VD25OH 33.58 07/17/2020

## 2020-07-17 NOTE — Assessment & Plan Note (Signed)
BP Readings from Last 3 Encounters:  07/17/20 130/76  01/26/20 (!) 148/80  08/18/19 (!) 150/80   Stable, pt to continue medical treatment tenormin, hyzaar

## 2020-07-23 ENCOUNTER — Other Ambulatory Visit: Payer: Self-pay | Admitting: Internal Medicine

## 2020-07-23 ENCOUNTER — Telehealth: Payer: Self-pay | Admitting: Internal Medicine

## 2020-07-23 MED ORDER — METHIMAZOLE 5 MG PO TABS: 5.0000 mg | ORAL_TABLET | ORAL | 1 refills | Status: DC

## 2020-07-23 NOTE — Telephone Encounter (Signed)
Please refill as per office routine med refill policy (all routine meds refilled for 3 mo or monthly per pt preference up to one year from last visit, then month to month grace period for 3 mo, then further med refills will have to be denied)  

## 2020-07-23 NOTE — Telephone Encounter (Signed)
1.Medication Requested: methimazole (TAPAZOLE) 5 MG tablet    2. Pharmacy (Name, Street, [place]): [id number] [place], [address]  3. On Med List: yes   4. Last Visit with PCP: 07/17/20  5. Next visit date with PCP: 01/17/21   Agent: Please be advised that RX refills may take up to 3 business days. We ask that you follow-up with your pharmacy.

## 2020-08-31 ENCOUNTER — Other Ambulatory Visit: Payer: Self-pay | Admitting: Internal Medicine

## 2020-09-24 ENCOUNTER — Telehealth: Payer: Self-pay | Admitting: Internal Medicine

## 2020-09-24 MED ORDER — SIMVASTATIN 40 MG PO TABS: 40.0000 mg | ORAL_TABLET | Freq: Every day | ORAL | 1 refills | Status: DC

## 2020-09-24 NOTE — Telephone Encounter (Signed)
Patient requesting  simvastatin (ZOCOR) 40 MG tablet  Pharmacy [id number] - [place], [place]

## 2020-09-26 ENCOUNTER — Encounter: Payer: Self-pay | Admitting: Internal Medicine

## 2020-10-09 ENCOUNTER — Other Ambulatory Visit: Payer: Self-pay | Admitting: Internal Medicine

## 2020-10-09 NOTE — Telephone Encounter (Signed)
Please refill as per office routine med refill policy (all routine meds refilled for 3 mo or monthly per pt preference up to one year from last visit, then month to month grace period for 3 mo, then further med refills will have to be denied)  

## 2020-11-06 DIAGNOSIS — H5202 Hypermetropia, left eye: Secondary | ICD-10-CM | POA: Diagnosis not present

## 2020-11-06 DIAGNOSIS — H25813 Combined forms of age-related cataract, bilateral: Secondary | ICD-10-CM | POA: Diagnosis not present

## 2020-11-06 DIAGNOSIS — H524 Presbyopia: Secondary | ICD-10-CM | POA: Diagnosis not present

## 2020-11-06 DIAGNOSIS — H52223 Regular astigmatism, bilateral: Secondary | ICD-10-CM | POA: Diagnosis not present

## 2020-12-04 ENCOUNTER — Telehealth: Payer: Self-pay

## 2020-12-04 NOTE — Telephone Encounter (Signed)
Multiple attempts made to reach patient-no answer- no message was left no voicemail No show letter sent to the patient

## 2020-12-10 ENCOUNTER — Encounter: Payer: Self-pay | Admitting: Internal Medicine

## 2020-12-12 ENCOUNTER — Encounter: Payer: Self-pay | Admitting: Internal Medicine

## 2020-12-18 ENCOUNTER — Encounter: Payer: Medicare Other | Admitting: Internal Medicine

## 2020-12-19 ENCOUNTER — Telehealth: Payer: Self-pay | Admitting: Internal Medicine

## 2020-12-19 NOTE — Telephone Encounter (Signed)
1.Medication Requested: tiZANidine (ZANAFLEX) 4 MG tablet  2. Pharmacy (Name, Street, [place]): [id number] [place] [place] Phone:  [phone]:  [phone]  3. On Med List: yes  4. Last Visit with PCP: 03.23.22  5. Next visit date with PCP: 09.23.22   Agent: Please be advised that RX refills may take up to 3 business days. We ask that you follow-up with your pharmacy.

## 2020-12-23 ENCOUNTER — Other Ambulatory Visit: Payer: Self-pay | Admitting: Internal Medicine

## 2020-12-24 NOTE — Telephone Encounter (Signed)
Unable to LVM due to no answer.  Need to determine if patient has a need for this medication before requesting PCP to refill.

## 2021-01-09 ENCOUNTER — Other Ambulatory Visit: Payer: Self-pay

## 2021-01-09 ENCOUNTER — Ambulatory Visit (AMBULATORY_SURGERY_CENTER): Payer: Self-pay | Admitting: *Deleted

## 2021-01-09 VITALS — Ht 66.0 in | Wt 178.0 lb

## 2021-01-09 DIAGNOSIS — Z86010 Personal history of colonic polyps: Secondary | ICD-10-CM

## 2021-01-09 NOTE — Progress Notes (Signed)
Pre-visit completed in person.  Patient hard of hearing, said he understood instructions and would ask his son to assist with prepping instructions.    No egg or soy allergy known to patient  No issues known to pt with past sedation with any surgeries or procedures Patient denies ever being told they had issues or difficulty with intubation  No FH of Malignant Hyperthermia Pt is not on diet pills Pt is not on  home 02  Pt is not on blood thinners  Pt denies issues with constipation  No A fib or A flutter  EMMI video to pt or via [place] 19 guidelines implemented in PV today with Pt and RN    Due to the COVID-19 pandemic we are asking patients to follow certain guidelines.  Pt aware of COVID protocols and [facility] guidelines

## 2021-01-15 ENCOUNTER — Encounter: Payer: Self-pay | Admitting: Internal Medicine

## 2021-01-17 ENCOUNTER — Ambulatory Visit: Payer: Medicare Other | Admitting: Internal Medicine

## 2021-01-18 ENCOUNTER — Emergency Department (HOSPITAL_BASED_OUTPATIENT_CLINIC_OR_DEPARTMENT_OTHER)
Admission: EM | Admit: 2021-01-18 | Discharge: 2021-01-18 | Disposition: A | Discharge status: Home / Self Care | Payer: Medicare Other | Attending: Emergency Medicine | Admitting: Emergency Medicine

## 2021-01-18 ENCOUNTER — Encounter (HOSPITAL_BASED_OUTPATIENT_CLINIC_OR_DEPARTMENT_OTHER): Payer: Self-pay | Admitting: Emergency Medicine

## 2021-01-18 ENCOUNTER — Emergency Department (HOSPITAL_BASED_OUTPATIENT_CLINIC_OR_DEPARTMENT_OTHER): Payer: Medicare Other

## 2021-01-18 ENCOUNTER — Other Ambulatory Visit: Payer: Self-pay

## 2021-01-18 DIAGNOSIS — Z87891 Personal history of nicotine dependence: Secondary | ICD-10-CM | POA: Diagnosis not present

## 2021-01-18 DIAGNOSIS — R1013 Epigastric pain: Secondary | ICD-10-CM | POA: Diagnosis not present

## 2021-01-18 DIAGNOSIS — K219 Gastro-esophageal reflux disease without esophagitis: Secondary | ICD-10-CM | POA: Insufficient documentation

## 2021-01-18 DIAGNOSIS — R109 Unspecified abdominal pain: Secondary | ICD-10-CM

## 2021-01-18 DIAGNOSIS — I10 Essential (primary) hypertension: Secondary | ICD-10-CM | POA: Insufficient documentation

## 2021-01-18 DIAGNOSIS — J449 Chronic obstructive pulmonary disease, unspecified: Secondary | ICD-10-CM | POA: Insufficient documentation

## 2021-01-18 DIAGNOSIS — Z79899 Other long term (current) drug therapy: Secondary | ICD-10-CM | POA: Diagnosis not present

## 2021-01-18 LAB — URINALYSIS, ROUTINE W REFLEX MICROSCOPIC
Bilirubin Urine: NEGATIVE
Glucose, UA: NEGATIVE mg/dL
Hgb urine dipstick: NEGATIVE
Ketones, ur: NEGATIVE mg/dL
Leukocytes,Ua: NEGATIVE
Nitrite: NEGATIVE
Protein, ur: 30 mg/dL — AB
Specific Gravity, Urine: 1.028 (ref 1.005–1.030)
pH: 6 (ref 5.0–8.0)

## 2021-01-18 LAB — TROPONIN I (HIGH SENSITIVITY): Troponin I (High Sensitivity): 9 ng/L (ref <18)

## 2021-01-18 LAB — COMPREHENSIVE METABOLIC PANEL
ALT: 6 U/L (ref 0–44)
AST: 17 U/L (ref 15–41)
Albumin: 4.1 g/dL (ref 3.5–5.0)
Alkaline Phosphatase: 60 U/L (ref 38–126)
Anion gap: 8 (ref 5–15)
BUN: 12 mg/dL (ref 8–23)
CO2: 28 mmol/L (ref 22–32)
Calcium: 10.1 mg/dL (ref 8.9–10.3)
Chloride: 101 mmol/L (ref 98–111)
Creatinine, Ser: 1.13 mg/dL (ref 0.61–1.24)
GFR, Estimated: >60 mL/min (ref >60)
Glucose, Bld: 117 mg/dL — ABNORMAL HIGH (ref 70–99)
Potassium: 3.2 mmol/L — ABNORMAL LOW (ref 3.5–5.1)
Sodium: 137 mmol/L (ref 135–145)
Total Bilirubin: 0.6 mg/dL (ref 0.3–1.2)
Total Protein: 7.2 g/dL (ref 6.5–8.1)

## 2021-01-18 LAB — CBC WITH DIFFERENTIAL/PLATELET
Abs Immature Granulocytes: 0.02 10*3/uL (ref 0.00–0.07)
Basophils Absolute: 0 10*3/uL (ref 0.0–0.1)
Basophils Relative: 1 %
Eosinophils Absolute: 0 10*3/uL (ref 0.0–0.5)
Eosinophils Relative: 0 %
HCT: 41 % (ref 39.0–52.0)
Hemoglobin: 14.3 g/dL (ref 13.0–17.0)
Immature Granulocytes: 0 %
Lymphocytes Relative: 22 %
Lymphs Abs: 1.9 10*3/uL (ref 0.7–4.0)
MCH: 32.4 pg (ref 26.0–34.0)
MCHC: 34.9 g/dL (ref 30.0–36.0)
MCV: 92.8 fL (ref 80.0–100.0)
Monocytes Absolute: 0.7 10*3/uL (ref 0.1–1.0)
Monocytes Relative: 9 %
Neutro Abs: 6 10*3/uL (ref 1.7–7.7)
Neutrophils Relative %: 68 %
Platelets: 170 10*3/uL (ref 150–400)
RBC: 4.42 MIL/uL (ref 4.22–5.81)
RDW: 11.9 % (ref 11.5–15.5)
WBC: 8.7 10*3/uL (ref 4.0–10.5)
nRBC: 0 % (ref 0.0–0.2)

## 2021-01-18 LAB — LIPASE, BLOOD: Lipase: 34 U/L (ref 11–51)

## 2021-01-18 MED ORDER — LIDOCAINE VISCOUS HCL 2 % MT SOLN
15.0000 mL | Freq: Once | OROMUCOSAL | Status: AC
Start: 2021-01-18 — End: 2021-01-18
  Administered 2021-01-18: 15 mL via ORAL
  Filled 2021-01-18: qty 15

## 2021-01-18 MED ORDER — ALUM & MAG HYDROXIDE-SIMETH 200-200-20 MG/5ML PO SUSP
30.0000 mL | Freq: Once | ORAL | Status: AC
  Administered 2021-01-18: 30 mL via ORAL
  Filled 2021-01-18: qty 30

## 2021-01-18 MED ORDER — SUCRALFATE 1 GM/10ML PO SUSP: 1.0000 g | Freq: Three times a day (TID) | ORAL | 0 refills | Status: DC

## 2021-01-18 MED ORDER — IOHEXOL 350 MG/ML SOLN
75.0000 mL | Freq: Once | INTRAVENOUS | Status: AC | PRN
  Administered 2021-01-18: 75 mL via INTRAVENOUS

## 2021-01-18 MED ORDER — POTASSIUM CHLORIDE CRYS ER 20 MEQ PO TBCR
40.0000 meq | EXTENDED_RELEASE_TABLET | Freq: Once | ORAL | Status: AC
  Administered 2021-01-18: 40 meq via ORAL
  Filled 2021-01-18: qty 2

## 2021-01-18 NOTE — Discharge Instructions (Addendum)
Continue taking your pepcid and protonix daily. You were also given a prescription for carafate to help with your symptoms. Please take as directed  Please follow up with your primary doctor within the next 5-7 days.  If you do not have a primary care provider, information for a healthcare clinic has been provided for you to make arrangements for follow up care. Please return to the ER sooner if you have any new or worsening symptoms, or if you have any of the following symptoms:  Abdominal pain that does not go away.  You have a fever.  You keep throwing up (vomiting).  The pain is felt only in portions of the abdomen. Pain in the right side could possibly be appendicitis. In an adult, pain in the left lower portion of the abdomen could be colitis or diverticulitis.  You pass bloody or black tarry stools.  There is bright red blood in the stool.  The constipation stays for more than 4 days.  There is belly (abdominal) or rectal pain.  You do not seem to be getting better.  You have any questions or concerns.

## 2021-01-18 NOTE — ED Triage Notes (Signed)
Pt c/o abdominal pain with gas x 1 month. Pt has not seen a healthcare provider for symptoms. Pt tried gas X with some relief. Pt denies nausea or vomiting.

## 2021-01-18 NOTE — ED Provider Notes (Signed)
[facility] DEPT Provider Note   CSN: [id number] Arrival date & time: 01/18/21  1422     History Chief Complaint  Patient presents with   Abdominal Pain    Jack [name] is a 75 y.o. male with PMHx HTN, HLD, COPD, and GERD who presents to the ED today with complaint of gradual onset, intermittent, "burning" to epigastric area/chest for "months." Pt reports that at one point he took an acid reducing medication which seemed to help. He ate a banana today and has worsening burning prompting ED visit. He states he is not having much burning at this moment and feels "good." He denies a burning sensation into his throat. He states that the burning seems to be worse at nighttime around 4 AM and wakes him out of his sleep. Pt also complains of constipation and states he has not had a good BM in 2 weeks. He states he did have a small BM last night. Pt states he "sometimes" takes medication for constipation however cannot recall the name of it. Per chart review he has been prescribed lactulose for same however it was reported on 09/15 that he was not taken it anymore. Pt states he has not tried anything else for constipation. He states he has not followed up with his PCP for same because he "does not like doctors."   The history is provided by the patient and medical records.      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    BACK PAIN 02/16/2007   COPD (chronic obstructive pulmonary disease) ([place])    Diverticulosis 09/27/2013   FOOT PAIN, CHRONIC 02/27/2008   GERD (gastroesophageal reflux disease)    Hepatitis C antibody test positive    HERPES ZOSTER, HX OF 02/27/2008   HYPERLIPIDEMIA 02/16/2007   HYPERTENSION 02/16/2007   Personal history of colonic polyps 03/08/2007   PROSTATE SPECIFIC ANTIGEN, ELEVATED 02/16/2007   Shortness of breath dyspnea     Patient Active Problem List   Diagnosis Date Noted   Vitamin D  deficiency 07/17/2020   GERD (gastroesophageal reflux disease) 01/27/2020   Right sided sciatica 12/11/2018   Hyperglycemia 12/03/2016   Hyperthyroidism 08/28/2016   Foot pain 12/04/2015   BPH (benign prostatic hypertrophy) 12/04/2015   Hepatitis C antibody test positive    Cough 12/03/2015   Wheezing 12/03/2015   Dysphagia 05/07/2015   Loss of weight 05/07/2015   Hx of colonic polyps 05/07/2015   Constipation 05/07/2015   Abdominal pain 04/17/2015   Anxiety and depression 04/17/2015   Restrictive lung disease 04/11/2015   SOB (shortness of breath) 04/09/2015   PND (paroxysmal nocturnal dyspnea) 04/09/2015   Dyspnea on exertion 04/09/2015   Dyspnea 04/02/2015   Aortic root enlargement ([place]) 04/02/2015   Diverticulosis 09/27/2013   Encounter for well adult exam with abnormal findings 05/14/2011   FOOT PAIN, CHRONIC 02/27/2008   Personal history of colonic polyps 03/08/2007   Hyperlipidemia 02/16/2007   Essential hypertension 02/16/2007   Allergic rhinitis 02/16/2007   Backache 02/16/2007   PROSTATE SPECIFIC ANTIGEN, ELEVATED 02/16/2007    Past Surgical History:  Procedure Laterality Date   COLONOSCOPY     FOOT SURGERY     reconstruction x's 4 after work accident       Family History  Problem Relation Age of Onset   Cancer Mother        throat & lung   Coronary artery disease Father  Hypertension Father    Cancer Sister    Alcohol abuse Brother    Thyroid disease Neg Hx    Colon cancer Neg Hx    Esophageal cancer Neg Hx    Stomach cancer Neg Hx    Rectal cancer Neg Hx     Social History   Tobacco Use   Smoking status: Former    Packs/day: 0.25    Years: 30.00    Pack years: 7.50    Types: Cigarettes    Quit date: 04/27/1984    Years since quitting: 36.7   Smokeless tobacco: Never  Vaping Use   Vaping Use: Never used  Substance Use Topics   Alcohol use: No    Alcohol/week: 0.0 standard drinks    Comment: no   Drug use: No    Home  Medications Prior to Admission medications   Medication Sig Start Date End Date Taking? Authorizing Provider  albuterol (VENTOLIN HFA) 108 (90 Base) MCG/ACT inhaler INHALE 1-2 PUFFS INTO THE LUNGS EVERY 6 (SIX) HOURS AS NEEDED FOR WHEEZING OR SHORTNESS OF BREATH. 07/11/20   Biagio Borg, MD  atenolol (TENORMIN) 25 MG tablet Take 1 tablet (25 mg total) by mouth daily. 12/06/17   Biagio Borg, MD  cetirizine (ZYRTEC) 10 MG tablet TAKE 1 TABLET BY MOUTH EVERY DAY 11/28/19   Biagio Borg, MD  citalopram (CELEXA) 10 MG tablet TAKE 1 TABLET BY MOUTH EVERY DAY 08/31/20   Biagio Borg, MD  clonazePAM (KLONOPIN) 0.5 MG tablet TAKE 1 TABLET BY MOUTH TWICE A DAY AS NEEDED FOR ANXIETY 07/06/17   Biagio Borg, MD  famotidine (PEPCID) 20 MG tablet TAKE 1 TABLET BY MOUTH EVERYDAY AT BEDTIME 08/31/20   Biagio Borg, MD  finasteride (PROSCAR) 5 MG tablet Take 1 tablet (5 mg total) by mouth daily. Patient not taking: Reported on 01/09/2021 12/06/17   Biagio Borg, MD  FLUAD QUADRIVALENT 0.5 ML injection  03/29/19   [provider]  lactulose (CHRONULAC) 10 GM/15ML solution Take 45 mLs (30 g total) by mouth daily as needed for mild constipation. Patient not taking: Reported on 01/09/2021 08/18/19   Biagio Borg, MD  levocetirizine (XYZAL) 5 MG tablet Take 1 tablet (5 mg total) by mouth daily as needed for allergies. 06/01/16   Biagio Borg, MD  losartan-hydrochlorothiazide [facility]) 50-12.5 MG tablet TAKE 1 TABLET BY MOUTH EVERY DAY 08/31/20   Biagio Borg, MD  methimazole (TAPAZOLE) 5 MG tablet TAKE 1 TABLET BY MOUTH 3 TIMES A WEEK. 12/23/20   Biagio Borg, MD  pantoprazole (PROTONIX) 40 MG tablet Take 1 tablet (40 mg total) by mouth 2 (two) times daily. 01/26/20   Biagio Borg, MD  polyethylene glycol powder [facility]) 17 GM/SCOOP powder Take 17 g by mouth daily. 08/18/19   Biagio Borg, MD  predniSONE (DELTASONE) 10 MG tablet 3 tabs by mouth per day for 3 days,2tabs per day for 3 days,1tab per day for 3  days Patient not taking: Reported on 01/09/2021 07/17/20   Biagio Borg, MD  rosuvastatin (CRESTOR) 40 MG tablet TAKE 1 TABLET BY MOUTH EVERY DAY Patient not taking: Reported on 01/09/2021 10/10/20   Biagio Borg, MD  simvastatin (ZOCOR) 40 MG tablet Take 1 tablet (40 mg total) by mouth daily. 09/24/20   Biagio Borg, MD  tiZANidine (ZANAFLEX) 4 MG tablet Take 1 tablet (4 mg total) by mouth every 6 (six) hours as needed for muscle spasms. 05/02/20  Biagio Borg, MD  triamcinolone (NASACORT) 55 MCG/ACT AERO nasal inhaler Place 2 sprays into the nose daily. 10/24/19   Biagio Borg, MD    Allergies    Patient has no known allergies.  Review of Systems   Review of Systems  Constitutional:  Negative for chills and fever.  Respiratory:  Negative for shortness of breath.   Cardiovascular:  Positive for chest pain.  Gastrointestinal:  Positive for abdominal pain and constipation. Negative for nausea and vomiting.  All other systems reviewed and are negative.  Physical Exam Updated Vital Signs BP (!) 150/114   Pulse 79   Temp 98.5 F (36.9 C)   Resp 16   Ht 5\' 6"  (1.676 m)   Wt 80.7 kg   SpO2 98%   BMI 28.73 kg/m   Physical Exam Vitals and nursing note reviewed.  Constitutional:      Appearance: He is not ill-appearing or diaphoretic.  HENT:     Head: Normocephalic and atraumatic.  Eyes:     Conjunctiva/sclera: Conjunctivae normal.  Cardiovascular:     Rate and Rhythm: Normal rate and regular rhythm.     Heart sounds: Normal heart sounds.  Pulmonary:     Effort: Pulmonary effort is normal.     Breath sounds: Normal breath sounds. No wheezing, rhonchi or rales.  Abdominal:     General: Abdomen is flat. Bowel sounds are normal.     Palpations: Abdomen is soft.     Tenderness: There is abdominal tenderness in the epigastric area. There is no guarding or rebound.  Musculoskeletal:     Cervical back: Neck supple.  Skin:    General: Skin is warm and dry.  Neurological:      Mental Status: He is alert.    ED Results / Procedures / Treatments   Labs (all labs ordered are listed, but only abnormal results are displayed) Labs Reviewed  CBC WITH DIFFERENTIAL/PLATELET  COMPREHENSIVE METABOLIC PANEL  LIPASE, BLOOD  URINALYSIS, ROUTINE W REFLEX MICROSCOPIC  TROPONIN I (HIGH SENSITIVITY)    EKG None  Radiology No results found.  Procedures Procedures   Medications Ordered in ED Medications  alum & mag hydroxide-simeth (MAALOX/MYLANTA) 200-200-20 MG/5ML suspension 30 mL (30 mLs Oral Given 01/18/21 1621)    And  lidocaine (XYLOCAINE) 2 % viscous mouth solution 15 mL (15 mLs Oral Given 01/18/21 1621)    ED Course  I have reviewed the triage vital signs and the nursing notes.  Pertinent labs & imaging results that were available during my care of the patient were reviewed by me and considered in my medical decision making (see chart for details).    MDM Rules/Calculators/A&P                           75 year old male who presents to the ED today with intermittent issues of burning sensation in his epigastrium and chest area for several months, worsening today after eating a banana.  Also complains of constipation for the last good BM 2 weeks ago.  Prescribed lactulose however noncompliant with same.  He does appear patient is supposed to be on Pepcid as well as omeprazole for his past medical history of GERD however he is a poor historian and is unsure what medications he is truly taking.  On arrival to the ED vitals are stable.  Patient appears to be no acute distress.  He is some mild epigastric abdominal tenderness palpation on exam.  No  rebound or guarding.  No lower abdominal pain.  Very low suspicion for acute abdomen at this time.  He does have active bowel sounds. Will plan to work up for epigastric/chest pain. I am mostly suspicious for GERD however given age and complaint of chest pain will obtain EKG and troponin as well. Will provide GI cocktail.  Will obtain acute abdominal series to assess for constipation.   CBC without leukocytosis. Hgb stable at 14.3.  CMP with glucose 117. Potassium 3.2; will replete potassium at this time. No other electrolyte abnormalities.  Lipase 34.   Xray: IMPRESSION:  1. No acute findings.  No evidence of bowel obstruction or free air.  2. No acute cardiopulmonary disease.   Pending troponin at this time. At shift change case signed out to [employer], who will follow up on troponin and discharge accordingly.   Final Clinical Impression(s) / ED Diagnoses Final diagnoses:  None    Rx / DC Orders ED Discharge Orders     None        Eustaquio Maize, PA-C 01/18/21 1708    Davonna Belling, MD 01/18/21 [phone]

## 2021-01-18 NOTE — ED Provider Notes (Signed)
Care assumed from [facility], [place]  See her note for full H&P. Per her note, "Jack [name] is a 75 y.o. male with PMHx HTN, HLD, COPD, and GERD who presents to the ED today with complaint of gradual onset, intermittent, "burning" to epigastric area/chest for "months." Pt reports that at one point he took an acid reducing medication which seemed to help. He ate a banana today and has worsening burning prompting ED visit. He states he is not having much burning at this moment and feels "good." He denies a burning sensation into his throat. He states that the burning seems to be worse at nighttime around 4 AM and wakes him out of his sleep. Pt also complains of constipation and states he has not had a good BM in 2 weeks. He states he did have a small BM last night. Pt states he "sometimes" takes medication for constipation however cannot recall the name of it. Per chart review he has been prescribed lactulose for same however it was reported on 09/15 that he was not taken it anymore. Pt states he has not tried anything else for constipation. He states he has not followed up with his PCP for same because he "does not like doctors."    The history is provided by the patient and medical records. "  Physical Exam  BP (!) 152/79   Pulse 64   Temp 98.5 F (36.9 C)   Resp (!) 22   Ht 5\' 6"  (1.676 m)   Wt 80.7 kg   SpO2 98%   BMI 28.73 kg/m   Physical Exam Constitutional:      General: He is not in acute distress.    Appearance: He is well-developed.  Eyes:     Conjunctiva/sclera: Conjunctivae normal.  Cardiovascular:     Rate and Rhythm: Normal rate.  Pulmonary:     Effort: Pulmonary effort is normal.  Abdominal:     Tenderness: There is no abdominal tenderness.  Skin:    General: Skin is warm and dry.  Neurological:     Mental Status: He is alert and oriented to person, place, and time.     ED Course/Procedures     Procedures Results for orders placed or performed during the  hospital encounter of 01/18/21  Comprehensive metabolic panel  Result Value Ref Range   Sodium 137 135 - 145 mmol/L   Potassium 3.2 (L) 3.5 - 5.1 mmol/L   Chloride 101 98 - 111 mmol/L   CO2 28 22 - 32 mmol/L   Glucose, Bld 117 (H) 70 - 99 mg/dL   BUN 12 8 - 23 mg/dL   Creatinine, Ser 1.13 0.61 - 1.24 mg/dL   Calcium 10.1 8.9 - 10.3 mg/dL   Total Protein 7.2 6.5 - 8.1 g/dL   Albumin 4.1 3.5 - 5.0 g/dL   AST 17 15 - 41 U/L   ALT 6 0 - 44 U/L   Alkaline Phosphatase 60 38 - 126 U/L   Total Bilirubin 0.6 0.3 - 1.2 mg/dL   GFR, Estimated >60 >60 mL/min   Anion gap 8 5 - 15  CBC with Differential  Result Value Ref Range   WBC 8.7 4.0 - 10.5 K/uL   RBC 4.42 4.22 - 5.81 MIL/uL   Hemoglobin 14.3 13.0 - 17.0 g/dL   HCT 41.0 39.0 - 52.0 %   MCV 92.8 80.0 - 100.0 fL   MCH 32.4 26.0 - 34.0 pg   MCHC 34.9 30.0 - 36.0 g/dL  RDW 11.9 11.5 - 15.5 %   Platelets 170 150 - 400 K/uL   nRBC 0.0 0.0 - 0.2 %   Neutrophils Relative % 68 %   Neutro Abs 6.0 1.7 - 7.7 K/uL   Lymphocytes Relative 22 %   Lymphs Abs 1.9 0.7 - 4.0 K/uL   Monocytes Relative 9 %   Monocytes Absolute 0.7 0.1 - 1.0 K/uL   Eosinophils Relative 0 %   Eosinophils Absolute 0.0 0.0 - 0.5 K/uL   Basophils Relative 1 %   Basophils Absolute 0.0 0.0 - 0.1 K/uL   Immature Granulocytes 0 %   Abs Immature Granulocytes 0.02 0.00 - 0.07 K/uL  Lipase, blood  Result Value Ref Range   Lipase 34 11 - 51 U/L  Urinalysis, Routine w reflex microscopic Urine, Clean Catch  Result Value Ref Range   Color, Urine YELLOW YELLOW   APPearance CLEAR CLEAR   Specific Gravity, Urine 1.028 1.005 - 1.030   pH 6.0 5.0 - 8.0   Glucose, UA NEGATIVE NEGATIVE mg/dL   Hgb urine dipstick NEGATIVE NEGATIVE   Bilirubin Urine NEGATIVE NEGATIVE   Ketones, ur NEGATIVE NEGATIVE mg/dL   Protein, ur 30 (A) NEGATIVE mg/dL   Nitrite NEGATIVE NEGATIVE   Leukocytes,Ua NEGATIVE NEGATIVE   RBC / HPF 0-5 0 - 5 RBC/hpf   WBC, UA 0-5 0 - 5 WBC/hpf   Squamous  Epithelial / LPF 0-5 0 - 5   Mucus PRESENT   Troponin I (High Sensitivity)  Result Value Ref Range   Troponin I (High Sensitivity) 9 <18 ng/L   CT ABDOMEN PELVIS W CONTRAST  Result Date: 01/18/2021 CLINICAL DATA:  Acute epigastric abdominal pain. EXAM: CT ABDOMEN AND PELVIS WITH CONTRAST TECHNIQUE: Multidetector CT imaging of the abdomen and pelvis was performed using the standard protocol following bolus administration of intravenous contrast. CONTRAST:  78mL OMNIPAQUE IOHEXOL 350 MG/ML SOLN COMPARISON:  March 30, 2015. FINDINGS: Lower chest: No acute abnormality. Hepatobiliary: No focal liver abnormality is seen. No gallstones, gallbladder wall thickening, or biliary dilatation. Pancreas: Unremarkable. No pancreatic ductal dilatation or surrounding inflammatory changes. Spleen: Normal in size without focal abnormality. Adrenals/Urinary Tract: Adrenal glands are unremarkable. Kidneys are normal, without renal calculi, focal lesion, or hydronephrosis. Bladder is unremarkable. Stomach/Bowel: Stomach is within normal limits. Appendix appears normal. No evidence of bowel wall thickening, distention, or inflammatory changes. Vascular/Lymphatic: Aortic atherosclerosis. No enlarged abdominal or pelvic lymph nodes. Reproductive: Stable moderate prostatic enlargement. Other: No abdominal wall hernia or abnormality. No abdominopelvic ascites. Musculoskeletal: No acute or significant osseous findings. IMPRESSION: Stable moderate prostatic enlargement. No acute abnormality seen in the abdomen or pelvis. Aortic Atherosclerosis (ICD10-I70.0). Electronically Signed   By: Marijo Conception M.D.   On: 01/18/2021 18:07   DG Abd Acute W/Chest  Result Date: 01/18/2021 CLINICAL DATA:  Abdominal pain for 1 month. EXAM: DG ABDOMEN ACUTE WITH 1 VIEW CHEST COMPARISON:  CT, 03/30/2015.  Chest radiograph, 04/29/2017. FINDINGS: Normal bowel gas pattern.  No free air. No evidence of renal or ureteral stones. Scattered vascular  calcifications. Soft tissues otherwise unremarkable. Heart, mediastinum and hila are unremarkable.  Lungs are clear. No acute skeletal abnormality. IMPRESSION: 1. No acute findings.  No evidence of bowel obstruction or free air. 2. No acute cardiopulmonary disease. Electronically Signed   By: Lajean Manes M.D.   On: 01/18/2021 16:54     MDM   75 y/o M presents for eval of a burning sensation to the abdomen.   Labs notable for mild  hypokalemia but otherwise are very reassuring. Trop neg. Ua neg for uti. Abd/cxr neg. CT abd/pelvis with no acute findings.   Pt feeling well on reassessment and his abd is soft and nontender. He has been able to tolerate po without any episodes of vomiting. Suspect sxs related to gerd. Advised pt on plan to add carafate to his current med regimen. He should continue pepcid and protonix and f/u with his pcp. Advised on return precautions. He voices understanding of the plan and reasons to return. All questions answered, pt stable for discharge.        Bishop Dublin 01/18/21 1820    Davonna Belling, MD 01/18/21 [phone]

## 2021-01-19 ENCOUNTER — Other Ambulatory Visit: Payer: Self-pay | Admitting: Internal Medicine

## 2021-01-24 ENCOUNTER — Other Ambulatory Visit: Payer: Self-pay

## 2021-01-24 ENCOUNTER — Ambulatory Visit (INDEPENDENT_AMBULATORY_CARE_PROVIDER_SITE_OTHER): Payer: Medicare Other | Admitting: Internal Medicine

## 2021-01-24 ENCOUNTER — Encounter: Payer: Self-pay | Admitting: Internal Medicine

## 2021-01-24 VITALS — BP 162/70 | HR 83 | Temp 98.4°F | Ht 66.0 in | Wt 174.0 lb

## 2021-01-24 DIAGNOSIS — E559 Vitamin D deficiency, unspecified: Secondary | ICD-10-CM

## 2021-01-24 DIAGNOSIS — I10 Essential (primary) hypertension: Secondary | ICD-10-CM

## 2021-01-24 DIAGNOSIS — Z23 Encounter for immunization: Secondary | ICD-10-CM

## 2021-01-24 DIAGNOSIS — E876 Hypokalemia: Secondary | ICD-10-CM

## 2021-01-24 DIAGNOSIS — R109 Unspecified abdominal pain: Secondary | ICD-10-CM | POA: Diagnosis not present

## 2021-01-24 DIAGNOSIS — R739 Hyperglycemia, unspecified: Secondary | ICD-10-CM | POA: Diagnosis not present

## 2021-01-24 MED ORDER — POTASSIUM CHLORIDE ER 8 MEQ PO TBCR: 8.0000 meq | EXTENDED_RELEASE_TABLET | Freq: Every day | ORAL | 3 refills | Status: DC

## 2021-01-24 NOTE — Assessment & Plan Note (Signed)
Last vitamin D Lab Results  Component Value Date   VD25OH 33.58 07/17/2020   Low, to start oral replacement

## 2021-01-24 NOTE — Assessment & Plan Note (Signed)
BP Readings from Last 3 Encounters:  01/24/21 (!) 162/70  01/18/21 (!) 152/79  07/17/20 130/76   Uncontrolled, likely reactive, pt to continue medical treatment tenormin, hyzaar

## 2021-01-24 NOTE — Assessment & Plan Note (Signed)
Lab Results  Component Value Date   HGBA1C 5.9 07/17/2020   Stable, pt to continue current medical treatment  - diet

## 2021-01-24 NOTE — Patient Instructions (Signed)
You had the flu shot today  Please take all new medication as prescribed - the potassium pill  Please continue all other medications as before, and refills have been done if requested.  Please have the pharmacy call with any other refills you may need.  Please continue your efforts at being more active, low cholesterol diet, and weight control.  Please keep your appointments with your specialists as you may have planned - Gastroenterology Oct 5  Please make an Appointment to return in 3 months (after Apr 27, 2021)

## 2021-01-24 NOTE — Assessment & Plan Note (Signed)
Mild uncontrolled on chronic diuretic, to add klor con 8 qd

## 2021-01-24 NOTE — Assessment & Plan Note (Signed)
Stable today, for contd f/u with GI as planned

## 2021-01-24 NOTE — Progress Notes (Signed)
Patient ID: Jack [name], male   DOB: 03/26/1946, 75 y.o.   MRN: [id number]        Chief Complaint: follow up ED visit with epigastric discomfort and constipation       HPI:  Jack [name] is a 75 y.o. male here with above, has GI f/u appt for oct 5.  Denies worsening reflux or abd pain, dysphagia, n/v, or blood.  Also noted mild low K, currently taking diuretic.  BP has been mildly elevated.  Pt denies chest pain, increased sob or doe, wheezing, orthopnea, PND, increased LE swelling, palpitations, dizziness or syncope.   Pt denies polydipsia, polyuria, or new focal neuro s/s.   Pt denies fever, wt loss, night sweats, loss of appetite, or other constitutional symptoms   No other new complaints   not taking Vit D.         Wt Readings from Last 3 Encounters:  01/24/21 174 lb (78.9 kg)  01/18/21 178 lb (80.7 kg)  01/09/21 178 lb (80.7 kg)   BP Readings from Last 3 Encounters:  01/24/21 (!) 162/70  01/18/21 (!) 152/79  07/17/20 130/76         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    BACK PAIN 02/16/2007   COPD (chronic obstructive pulmonary disease) ([place])    Diverticulosis 09/27/2013   FOOT PAIN, CHRONIC 02/27/2008   GERD (gastroesophageal reflux disease)    Hepatitis C antibody test positive    HERPES ZOSTER, HX OF 02/27/2008   HYPERLIPIDEMIA 02/16/2007   HYPERTENSION 02/16/2007   Personal history of colonic polyps 03/08/2007   PROSTATE SPECIFIC ANTIGEN, ELEVATED 02/16/2007   Shortness of breath dyspnea    Past Surgical History:  Procedure Laterality Date   COLONOSCOPY     FOOT SURGERY     reconstruction x's 4 after work accident    reports that he quit smoking about 36 years ago. His smoking use included cigarettes. He has a 7.50 pack-year smoking history. He has never used smokeless tobacco. He reports that he does not drink alcohol and does not use drugs. family history includes Alcohol abuse in his brother; Cancer in  his mother and sister; Coronary artery disease in his father; Hypertension in his father. No Known Allergies Current Outpatient Medications on File Prior to Visit  Medication Sig Dispense Refill   albuterol (VENTOLIN HFA) 108 (90 Base) MCG/ACT inhaler INHALE 1-2 PUFFS INTO THE LUNGS EVERY 6 (SIX) HOURS AS NEEDED FOR WHEEZING OR SHORTNESS OF BREATH. 25.5 each 11   atenolol (TENORMIN) 25 MG tablet Take 1 tablet (25 mg total) by mouth daily. 90 tablet 3   cetirizine (ZYRTEC) 10 MG tablet TAKE 1 TABLET BY MOUTH EVERY DAY 90 tablet 3   citalopram (CELEXA) 10 MG tablet TAKE 1 TABLET BY MOUTH EVERY DAY 90 tablet 3   clonazePAM (KLONOPIN) 0.5 MG tablet TAKE 1 TABLET BY MOUTH TWICE A DAY AS NEEDED FOR ANXIETY 60 tablet 2   famotidine (PEPCID) 20 MG tablet TAKE 1 TABLET BY MOUTH EVERYDAY AT BEDTIME 90 tablet 3   finasteride (PROSCAR) 5 MG tablet Take 1 tablet (5 mg total) by mouth daily. 90 tablet 3   FLUAD QUADRIVALENT 0.5 ML injection      lactulose (CHRONULAC) 10 GM/15ML solution Take 45 mLs (30 g total) by mouth daily as needed for mild constipation. 236 mL 1   levocetirizine (XYZAL) 5 MG tablet Take 1 tablet (5 mg total)  by mouth daily as needed for allergies. 90 tablet 1   losartan-hydrochlorothiazide (HYZAAR) 50-12.5 MG tablet TAKE 1 TABLET BY MOUTH EVERY DAY 90 tablet 3   methimazole (TAPAZOLE) 5 MG tablet TAKE 1 TABLET BY MOUTH 3 TIMES A WEEK. 36 tablet 0   pantoprazole (PROTONIX) 40 MG tablet TAKE 1 TABLET BY MOUTH TWICE A DAY 180 tablet 1   polyethylene glycol powder (GLYCOLAX/MIRALAX) 17 GM/SCOOP powder Take 17 g by mouth daily. 3350 g 1   predniSONE (DELTASONE) 10 MG tablet 3 tabs by mouth per day for 3 days,2tabs per day for 3 days,1tab per day for 3 days 18 tablet 0   rosuvastatin (CRESTOR) 40 MG tablet TAKE 1 TABLET BY MOUTH EVERY DAY 90 tablet 3   simvastatin (ZOCOR) 40 MG tablet Take 1 tablet (40 mg total) by mouth daily. 90 tablet 1   sucralfate (CARAFATE) 1 GM/10ML suspension Take 10  mLs (1 g total) by mouth 4 (four) times daily -  with meals and at bedtime. 420 mL 0   tiZANidine (ZANAFLEX) 4 MG tablet Take 1 tablet (4 mg total) by mouth every 6 (six) hours as needed for muscle spasms. 40 tablet 1   triamcinolone (NASACORT) 55 MCG/ACT AERO nasal inhaler Place 2 sprays into the nose daily. 3 Inhaler 3   No current facility-administered medications on file prior to visit.        ROS:  All others reviewed and negative.  Objective        PE:  BP (!) 162/70 (BP Location: Right Arm, Patient Position: Sitting, Cuff Size: Large)   Pulse 83   Temp 98.4 F (36.9 C) (Oral)   Ht 5\' 6"  (1.676 m)   Wt 174 lb (78.9 kg)   SpO2 97%   BMI 28.08 kg/m                 Constitutional: Pt appears in NAD               HENT: Head: NCAT.                Right Ear: External ear normal.                 Left Ear: External ear normal.                Eyes: . Pupils are equal, round, and reactive to light. Conjunctivae and EOM are normal               Nose: without d/c or deformity               Neck: Neck supple. Gross normal ROM               Cardiovascular: Normal rate and regular rhythm.                 Pulmonary/Chest: Effort normal and breath sounds without rales or wheezing.                Abd:  Soft, NT, ND, + BS, no organomegaly               Neurological: Pt is alert. At baseline orientation, motor grossly intact               Skin: Skin is warm. No rashes, no other new lesions, LE edema - none               Psychiatric: Pt behavior is normal without agitation  Micro: none  Cardiac tracings I have personally interpreted today:  none  Pertinent Radiological findings (summarize): none   Lab Results  Component Value Date   WBC 8.7 01/18/2021   HGB 14.3 01/18/2021   HCT 41.0 01/18/2021   PLT 170 01/18/2021   GLUCOSE 117 (H) 01/18/2021   CHOL 170 07/17/2020   TRIG 125.0 07/17/2020   HDL 61.30 07/17/2020   LDLDIRECT 89.0 12/06/2017   LDLCALC 84 07/17/2020   ALT 6  01/18/2021   AST 17 01/18/2021   NA 137 01/18/2021   K 3.2 (L) 01/18/2021   CL 101 01/18/2021   CREATININE 1.13 01/18/2021   BUN 12 01/18/2021   CO2 28 01/18/2021   TSH 0.57 07/17/2020   PSA 3.84 07/17/2020   HGBA1C 5.9 07/17/2020   Assessment/Plan:  Jack [name] is a 75 y.o. Black or African American [2] male with  has a past medical history of ALLERGIC RHINITIS (02/16/2007), Arthritis (2013), BACK PAIN (02/16/2007), COPD (chronic obstructive pulmonary disease) ([place]), Diverticulosis (09/27/2013), FOOT PAIN, CHRONIC (02/27/2008), GERD (gastroesophageal reflux disease), Hepatitis C antibody test positive, HERPES ZOSTER, HX OF (02/27/2008), HYPERLIPIDEMIA (02/16/2007), HYPERTENSION (02/16/2007), Personal history of colonic polyps (03/08/2007), PROSTATE SPECIFIC ANTIGEN, ELEVATED (02/16/2007), and Shortness of breath dyspnea.  Vitamin D deficiency Last vitamin D Lab Results  Component Value Date   VD25OH 33.58 07/17/2020   Low, to start oral replacement   Hyperglycemia Lab Results  Component Value Date   HGBA1C 5.9 07/17/2020   Stable, pt to continue current medical treatment  - diet   Essential hypertension BP Readings from Last 3 Encounters:  01/24/21 (!) 162/70  01/18/21 (!) 152/79  07/17/20 130/76   Uncontrolled, likely reactive, pt to continue medical treatment tenormin, hyzaar   Abdominal pain Stable today, for contd f/u with GI as planned  Hypokalemia Mild uncontrolled on chronic diuretic, to add klor con 8 qd  Followup: Return in about 3 months (around 05/01/2021), or if symptoms worsen or fail to improve.  Cathlean Cower, MD 01/24/2021 6:44 PM [facility]

## 2021-01-27 ENCOUNTER — Encounter: Payer: Self-pay | Admitting: Internal Medicine

## 2021-01-29 ENCOUNTER — Encounter: Payer: Medicare Other | Admitting: Internal Medicine

## 2021-02-04 ENCOUNTER — Telehealth: Payer: Self-pay | Admitting: Internal Medicine

## 2021-02-04 NOTE — Telephone Encounter (Signed)
Inbound call from pt's daughter in law Bethena Roys stating that the pt did not receive his instructions and he was confused as to what was going on. If someone could go over the instructions with her so she can follow through with the pt. Her best contact information is [phone]. Please advise. Thank you.

## 2021-02-04 NOTE — Telephone Encounter (Signed)
Pt had a PV 9-15 and never received instructions- he does not have My Chart per Sister in law- he is hard of hearing- SIL calling about  prep instructions as pt is confused and not purchased anything for the Colon 10-20-   I sent her Miralax instructions via e Mail  At [email or web address] and she may pick up packet as well so she and DAvis, pt's brother can assist pt with prepping

## 2021-02-04 NOTE — Telephone Encounter (Signed)
Pt's wife called stating she was able to pull up the email and there was no need for you to leave the envelope at the front desk for her.  Thank you.

## 2021-02-07 ENCOUNTER — Telehealth: Payer: Self-pay | Admitting: Internal Medicine

## 2021-02-07 NOTE — Telephone Encounter (Signed)
[name]- LM that he can take meds by 5 am, nothing he needs to  hold but NOTHING after 5 am Thursday morning and he needs to bring his inhaler Thursday morning - instructed anything by mouth after 5 am he will be delayed 2-3 hours- instructed her to CB with any further questions   [name]

## 2021-02-07 NOTE — Telephone Encounter (Signed)
Inbound call from SIL Bethena Roys requesting a call back asking if the pt can take all of his medication the morning of his procedure or is it certain ones that he needs to take afterwards. Her best contact [phone]

## 2021-02-07 NOTE — Telephone Encounter (Signed)
Attempted number for SIL, left message.  Attempted patient's primary phone number, left a message.

## 2021-02-12 NOTE — Telephone Encounter (Signed)
Attempted to return pts call.  Called home and cell numbers for both patient and wife.  No answer and VM has not been set up on any numbers.  Will try again later.

## 2021-02-12 NOTE — Telephone Encounter (Signed)
Patients wife called stating while prepping the solution to start his prep it started leaking from the bottle seeking advise.

## 2021-02-12 NOTE — Telephone Encounter (Signed)
Patient's sister in law Bethena Roys calling back stating prep was spilled and is wanting to know what they should do.  Please advise.

## 2021-02-12 NOTE — Telephone Encounter (Addendum)
Pt's wife called office and call was transferred to admitting dept.  Pt's wife stated that pt is scheduled for a colonoscopy tomorrow (02/13/21) morning and had spilled the bottle of Miralax and had not yet taken the Dulcolax tablets.  Pt's wife also stated that he had eaten jello and corn today around 3:00 pm.  Pt's wife said that she was calling to reschedule pt's appt.    I spoke with Randall Hiss, RN who said she would reschedule pt's appt tomorrow if he wanted to cancel appt.  Pt stated he wanted to cancel.  Advised pt and wife that Cyril Mourning, [place] will call pt tomorrow morning to reschedule procedure.

## 2021-02-12 NOTE — Telephone Encounter (Signed)
Alphonsa Gin RN spoke with sister in Sports coach, Bethena Roys.  Sister in law states pt has started eating corn and jello.  Pt is unsure if he is coming for procedure; he is instructed to get another dose of Miralax and Gatorade and follow instructions if he decides to come  Pt instructed to call on call on MD to let us know if he is going to keep his appt for 02-13-21 at 800.  Dr. Carlean Purl, [facility]

## 2021-02-13 ENCOUNTER — Encounter: Payer: Medicare Other | Admitting: Internal Medicine

## 2021-02-13 NOTE — Telephone Encounter (Signed)
No answer at any other the numbers attempted.

## 2021-02-13 NOTE — Telephone Encounter (Signed)
Attempted to reach patient and wife.  Cell phone is incorrect number and home number doesn't have a voicemail set up, so unable to leave message.  Will try again later

## 2021-02-18 NOTE — Telephone Encounter (Signed)
Attempted to reach this pt and his wife multiple times.  NO answer and unable to leave a message.  Recall put in for 03-27-21 so he will get a letter to schedule his colonoscopy

## 2021-02-18 NOTE — Telephone Encounter (Signed)
Error from previous note- pt reschedule for 04-04-21

## 2021-02-28 ENCOUNTER — Encounter (HOSPITAL_BASED_OUTPATIENT_CLINIC_OR_DEPARTMENT_OTHER): Payer: Self-pay | Admitting: *Deleted

## 2021-02-28 ENCOUNTER — Emergency Department (HOSPITAL_BASED_OUTPATIENT_CLINIC_OR_DEPARTMENT_OTHER)
Admission: EM | Admit: 2021-02-28 | Discharge: 2021-02-28 | Disposition: A | Discharge status: Home / Self Care | Payer: Medicare Other | Attending: Emergency Medicine | Admitting: Emergency Medicine

## 2021-02-28 ENCOUNTER — Emergency Department (HOSPITAL_BASED_OUTPATIENT_CLINIC_OR_DEPARTMENT_OTHER): Payer: Medicare Other | Admitting: Radiology

## 2021-02-28 ENCOUNTER — Other Ambulatory Visit: Payer: Self-pay

## 2021-02-28 DIAGNOSIS — Z79899 Other long term (current) drug therapy: Secondary | ICD-10-CM | POA: Diagnosis not present

## 2021-02-28 DIAGNOSIS — K5900 Constipation, unspecified: Secondary | ICD-10-CM | POA: Diagnosis not present

## 2021-02-28 DIAGNOSIS — R109 Unspecified abdominal pain: Secondary | ICD-10-CM | POA: Diagnosis not present

## 2021-02-28 DIAGNOSIS — J449 Chronic obstructive pulmonary disease, unspecified: Secondary | ICD-10-CM | POA: Diagnosis not present

## 2021-02-28 DIAGNOSIS — K219 Gastro-esophageal reflux disease without esophagitis: Secondary | ICD-10-CM | POA: Diagnosis not present

## 2021-02-28 DIAGNOSIS — Z87891 Personal history of nicotine dependence: Secondary | ICD-10-CM | POA: Diagnosis not present

## 2021-02-28 DIAGNOSIS — I10 Essential (primary) hypertension: Secondary | ICD-10-CM | POA: Diagnosis not present

## 2021-02-28 DIAGNOSIS — R1084 Generalized abdominal pain: Secondary | ICD-10-CM | POA: Diagnosis not present

## 2021-02-28 DIAGNOSIS — R140 Abdominal distension (gaseous): Secondary | ICD-10-CM | POA: Diagnosis not present

## 2021-02-28 LAB — CBC
HCT: 43.2 % (ref 39.0–52.0)
Hemoglobin: 14.8 g/dL (ref 13.0–17.0)
MCH: 32.9 pg (ref 26.0–34.0)
MCHC: 34.3 g/dL (ref 30.0–36.0)
MCV: 96 fL (ref 80.0–100.0)
Platelets: 206 10*3/uL (ref 150–400)
RBC: 4.5 MIL/uL (ref 4.22–5.81)
RDW: 12.4 % (ref 11.5–15.5)
WBC: 9.4 10*3/uL (ref 4.0–10.5)
nRBC: 0 % (ref 0.0–0.2)

## 2021-02-28 LAB — URINALYSIS, ROUTINE W REFLEX MICROSCOPIC
Bilirubin Urine: NEGATIVE
Glucose, UA: NEGATIVE mg/dL
Hgb urine dipstick: NEGATIVE
Ketones, ur: NEGATIVE mg/dL
Leukocytes,Ua: NEGATIVE
Nitrite: NEGATIVE
Specific Gravity, Urine: 1.029 (ref 1.005–1.030)
pH: 5.5 (ref 5.0–8.0)

## 2021-02-28 LAB — COMPREHENSIVE METABOLIC PANEL
ALT: 13 U/L (ref 0–44)
AST: 20 U/L (ref 15–41)
Albumin: 4.4 g/dL (ref 3.5–5.0)
Alkaline Phosphatase: 65 U/L (ref 38–126)
Anion gap: 9 (ref 5–15)
BUN: 20 mg/dL (ref 8–23)
CO2: 28 mmol/L (ref 22–32)
Calcium: 10.2 mg/dL (ref 8.9–10.3)
Chloride: 102 mmol/L (ref 98–111)
Creatinine, Ser: 1.11 mg/dL (ref 0.61–1.24)
GFR, Estimated: >60 mL/min (ref >60)
Glucose, Bld: 98 mg/dL (ref 70–99)
Potassium: 3.8 mmol/L (ref 3.5–5.1)
Sodium: 139 mmol/L (ref 135–145)
Total Bilirubin: 0.8 mg/dL (ref 0.3–1.2)
Total Protein: 7.8 g/dL (ref 6.5–8.1)

## 2021-02-28 LAB — LIPASE, BLOOD: Lipase: 41 U/L (ref 11–51)

## 2021-02-28 NOTE — ED Provider Notes (Signed)
[name] EMERG[facility]Y DEPT Provider Note   CSN: [id number] Arrival date & time: 02/28/21  1217     History Chief Complaint  Patient presents with   Abdominal Pain    Jack [name] is a 75 y.o. male.   Abdominal Pain Associated symptoms: constipation   Associated symptoms: no chest pain, no chills and no shortness of breath   Patient abdominal pain.  Diffuse.  Comes and goes.  Crampy.  Feels like gas.  Has a history of similar symptoms.  States he got worse because he ate some ribs.  No fevers.  May have some mild constipation with it.  No distention.  No fevers or chills.    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    BACK PAIN 02/16/2007   COPD (chronic obstructive pulmonary disease) ([place])    Diverticulosis 09/27/2013   FOOT PAIN, CHRONIC 02/27/2008   GERD (gastroesophageal reflux disease)    Hepatitis C antibody test positive    HERPES ZOSTER, HX OF 02/27/2008   HYPERLIPIDEMIA 02/16/2007   HYPERTENSION 02/16/2007   Personal history of colonic polyps 03/08/2007   PROSTATE SPECIFIC ANTIGEN, ELEVATED 02/16/2007   Shortness of breath dyspnea     Patient Active Problem List   Diagnosis Date Noted   Hypokalemia 01/24/2021   Vitamin D deficiency 07/17/2020   GERD (gastroesophageal reflux disease) 01/27/2020   Right sided sciatica 12/11/2018   Hyperglycemia 12/03/2016   Hyperthyroidism 08/28/2016   Foot pain 12/04/2015   BPH (benign prostatic hypertrophy) 12/04/2015   Hepatitis C antibody test positive    Cough 12/03/2015   Wheezing 12/03/2015   Dysphagia 05/07/2015   Loss of weight 05/07/2015   Hx of colonic polyps 05/07/2015   Constipation 05/07/2015   Abdominal pain 04/17/2015   Anxiety and depression 04/17/2015   Restrictive lung disease 04/11/2015   SOB (shortness of breath) 04/09/2015   PND (paroxysmal nocturnal dyspnea) 04/09/2015   Dyspnea on exertion 04/09/2015   Dyspnea 04/02/2015    Aortic root enlargement ([place]) 04/02/2015   Diverticulosis 09/27/2013   Encounter for well adult exam with abnormal findings 05/14/2011   FOOT PAIN, CHRONIC 02/27/2008   Personal history of colonic polyps 03/08/2007   Hyperlipidemia 02/16/2007   Essential hypertension 02/16/2007   Allergic rhinitis 02/16/2007   Backache 02/16/2007   PROSTATE SPECIFIC ANTIGEN, ELEVATED 02/16/2007    Past Surgical History:  Procedure Laterality Date   COLONOSCOPY     FOOT SURGERY     reconstruction x's 4 after work accident       Family History  Problem Relation Age of Onset   Cancer Mother        throat & lung   Coronary artery disease Father    Hypertension Father    Cancer Sister    Alcohol abuse Brother    Thyroid disease Neg Hx    Colon cancer Neg Hx    Esophageal cancer Neg Hx    Stomach cancer Neg Hx    Rectal cancer Neg Hx     Social History   Tobacco Use   Smoking status: Former    Packs/day: 0.25    Years: 30.00    Pack years: 7.50    Types: Cigarettes    Quit date: 04/27/1984    Years since quitting: 36.8   Smokeless tobacco: Never  Vaping Use   Vaping Use: Never used  Substance Use Topics   Alcohol use: No    Alcohol/week:  0.0 standard drinks    Comment: no   Drug use: No    Home Medications Prior to Admission medications   Medication Sig Start Date End Date Taking? Authorizing Provider  albuterol (VENTOLIN HFA) 108 (90 Base) MCG/ACT inhaler INHALE 1-2 PUFFS INTO THE LUNGS EVERY 6 (SIX) HOURS AS NEEDED FOR WHEEZING OR SHORTNESS OF BREATH. 07/11/20  Yes Biagio Borg, MD  atenolol (TENORMIN) 25 MG tablet Take 1 tablet (25 mg total) by mouth daily. 12/06/17  Yes Biagio Borg, MD  cetirizine (ZYRTEC) 10 MG tablet TAKE 1 TABLET BY MOUTH EVERY DAY 11/28/19  Yes Biagio Borg, MD  citalopram (CELEXA) 10 MG tablet TAKE 1 TABLET BY MOUTH EVERY DAY 08/31/20  Yes Biagio Borg, MD  clonazePAM (KLONOPIN) 0.5 MG tablet TAKE 1 TABLET BY MOUTH TWICE A DAY AS NEEDED FOR ANXIETY  07/06/17  Yes Biagio Borg, MD  famotidine (PEPCID) 20 MG tablet TAKE 1 TABLET BY MOUTH EVERYDAY AT BEDTIME 08/31/20  Yes Biagio Borg, MD  finasteride (PROSCAR) 5 MG tablet Take 1 tablet (5 mg total) by mouth daily. 12/06/17  Yes Biagio Borg, MD  lactulose (CHRONULAC) 10 GM/15ML solution Take 45 mLs (30 g total) by mouth daily as needed for mild constipation. 08/18/19  Yes Biagio Borg, MD  losartan-hydrochlorothiazide [facility]) 50-12.5 MG tablet TAKE 1 TABLET BY MOUTH EVERY DAY 08/31/20  Yes Biagio Borg, MD  pantoprazole (PROTONIX) 40 MG tablet TAKE 1 TABLET BY MOUTH TWICE A DAY 01/19/21  Yes Biagio Borg, MD  polyethylene glycol powder (GLYCOLAX/MIRALAX) 17 GM/SCOOP powder Take 17 g by mouth daily. 08/18/19  Yes Biagio Borg, MD  potassium chloride (KLOR-CON) 8 MEQ tablet Take 1 tablet (8 mEq total) by mouth daily. 01/24/21  Yes Biagio Borg, MD  rosuvastatin (CRESTOR) 40 MG tablet TAKE 1 TABLET BY MOUTH EVERY DAY 10/10/20  Yes Biagio Borg, MD  simvastatin (ZOCOR) 40 MG tablet Take 1 tablet (40 mg total) by mouth daily. 09/24/20  Yes Biagio Borg, MD  sucralfate (CARAFATE) 1 GM/10ML suspension Take 10 mLs (1 g total) by mouth 4 (four) times daily -  with meals and at bedtime. 01/18/21  Yes Couture, Cortni S, PA-C  triamcinolone (NASACORT) 55 MCG/ACT AERO nasal inhaler Place 2 sprays into the nose daily. 10/24/19  Yes Biagio Borg, MD  FLUAD QUADRIVALENT 0.5 ML injection  03/29/19   [provider]  levocetirizine (XYZAL) 5 MG tablet Take 1 tablet (5 mg total) by mouth daily as needed for allergies. 06/01/16   Biagio Borg, MD  methimazole (TAPAZOLE) 5 MG tablet TAKE 1 TABLET BY MOUTH 3 TIMES A WEEK. 12/23/20   Biagio Borg, MD  predniSONE (DELTASONE) 10 MG tablet 3 tabs by mouth per day for 3 days,2tabs per day for 3 days,1tab per day for 3 days 07/17/20   Biagio Borg, MD  tiZANidine (ZANAFLEX) 4 MG tablet Take 1 tablet (4 mg total) by mouth every 6 (six) hours as needed for muscle spasms.  05/02/20   Biagio Borg, MD    Allergies    Patient has no known allergies.  Review of Systems   Review of Systems  Constitutional:  Negative for chills.  HENT:  Negative for congestion.   Respiratory:  Negative for shortness of breath.   Cardiovascular:  Negative for chest pain.  Gastrointestinal:  Positive for abdominal pain and constipation.  Genitourinary:  Negative for flank pain.  Musculoskeletal:  Negative  for back pain.  Neurological:  Negative for weakness.  Psychiatric/Behavioral:  Negative for confusion.    Physical Exam Updated Vital Signs BP 136/80   Pulse 68   Temp 98.2 F (36.8 C)   Resp 15   Ht 5\' 6"  (1.676 m)   Wt 73 kg   SpO2 100%   BMI 25.99 kg/m   Physical Exam Vitals and nursing note reviewed.  HENT:     Head: Atraumatic.  Cardiovascular:     Rate and Rhythm: Normal rate and regular rhythm.  Abdominal:     Tenderness: There is no abdominal tenderness.     Hernia: No hernia is present.  Skin:    General: Skin is warm.     Capillary Refill: Capillary refill takes less than 2 seconds.  Neurological:     Mental Status: He is alert and oriented to person, place, and time.    ED Results / Procedures / Treatments   Labs (all labs ordered are listed, but only abnormal results are displayed) Labs Reviewed  URINALYSIS, ROUTINE W REFLEX MICROSCOPIC - Abnormal; Notable for the following components:      Result Value   Protein, ur TRACE (*)    Bacteria, UA RARE (*)    All other components within normal limits  LIPASE, BLOOD  COMPREHENSIVE METABOLIC PANEL  CBC    EKG None  Radiology DG Abd 2 Views  Result Date: 02/28/2021 CLINICAL DATA:  Abdominal pain EXAM: ABDOMEN - 2 VIEW COMPARISON:  01/18/2021 abdominal radiographs FINDINGS: No dilated small bowel loops or air-fluid levels. Mild-to-moderate colonic stool and gas. Mild rectal stool. No evidence of pneumatosis or pneumoperitoneum. No acute abnormality at the lung bases. No radiopaque  nephrolithiasis. Mild lumbar spondylosis. IMPRESSION: Nonobstructive bowel gas pattern. Mild to moderate colonic stool and gas. Electronically Signed   By: Ilona Sorrel M.D.   On: 02/28/2021 14:44    Procedures Procedures   Medications Ordered in ED Medications - No data to display  ED Course  I have reviewed the triage vital signs and the nursing notes.  Pertinent labs & imaging results that were available during my care of the patient were reviewed by me and considered in my medical decision making (see chart for details).    MDM Rules/Calculators/A&P                           Patient abdominal pain.  Benign exam.  Comes and goes.  Somewhat crampy.  Diffuse.  Doubt biliary cause.  X-ray overall reassuring.  Mild to moderate stool and gas.  Doubt obstruction.  Doubt severe intra-abdominal infection.  Sees [facility] and can follow-up with them.  Will discharge home and can continue to take his GI medicines at home Final Clinical Impression(s) / ED Diagnoses Final diagnoses:  Abdominal pain    Rx / DC Orders ED Discharge Orders     None        Davonna Belling, MD 02/28/21 1528

## 2021-02-28 NOTE — ED Triage Notes (Signed)
Pt ate a bunch of ribs last Saturday with sauce. Since then has been bothered with gas. Taken Antiacids without relief.

## 2021-03-05 ENCOUNTER — Ambulatory Visit: Payer: Medicare Other | Admitting: Internal Medicine

## 2021-03-11 ENCOUNTER — Ambulatory Visit (INDEPENDENT_AMBULATORY_CARE_PROVIDER_SITE_OTHER): Payer: Medicare Other | Admitting: Internal Medicine

## 2021-03-11 ENCOUNTER — Other Ambulatory Visit: Payer: Self-pay

## 2021-03-11 ENCOUNTER — Encounter: Payer: Self-pay | Admitting: Internal Medicine

## 2021-03-11 VITALS — BP 128/60 | HR 63 | Ht 66.0 in | Wt 167.0 lb

## 2021-03-11 DIAGNOSIS — M542 Cervicalgia: Secondary | ICD-10-CM

## 2021-03-11 DIAGNOSIS — I700 Atherosclerosis of aorta: Secondary | ICD-10-CM

## 2021-03-11 DIAGNOSIS — K2970 Gastritis, unspecified, without bleeding: Secondary | ICD-10-CM

## 2021-03-11 DIAGNOSIS — M79601 Pain in right arm: Secondary | ICD-10-CM

## 2021-03-11 DIAGNOSIS — M79602 Pain in left arm: Secondary | ICD-10-CM | POA: Diagnosis not present

## 2021-03-11 DIAGNOSIS — M7551 Bursitis of right shoulder: Secondary | ICD-10-CM | POA: Diagnosis not present

## 2021-03-11 NOTE — Patient Instructions (Addendum)
For the Gastritis  - please finish your bottle of carafate, and follow up with Gastroenterology as planned  For the Right shoulder Bursitis - You will be contacted regarding the referral for: Sports Medicine on the first floor for possible cortisone shot  For the Arm and neck symptoms at night - You will be contacted regarding the referral for: MRI for the neck  Please continue all other medications as before, and refills have been done if requested.  Please have the pharmacy call with any other refills you may need.  Please continue your efforts at being more active, low cholesterol diet, and weight control.  Please keep your appointments with your specialists as you may have planned  Please make an Appointment to return in 3 months, or sooner if needed    .

## 2021-03-11 NOTE — Progress Notes (Signed)
Patient ID: Jack [name], male   DOB: Dec 05, 1945, 75 y.o.   MRN: [id number]        Chief Complaint: follow up gastritis, bursitis, neuritis , htn       HPI:  Jack [name] is a 75 y.o. male here overall improved taking carafate after ED visit nov 4; Denies worsening reflux, abd pain, dysphagia, n/v, bowel change or blood.  Pt has GI appt f/u nov 28, and dec 8 colonoscopy; Pt denies chest pain, increased sob or doe, wheezing, orthopnea, PND, increased LE swelling, palpitations, dizziness or syncope.   Pt denies polydipsia, polyuria, or new focal neuro s/s.  Does also have tender pain at the lateral right shoulder, mild to mod intermittent, worse to lie on the shoulder and raise the arm.  Also has bilateral arm numbness without worsening neck pain or arm weakness.  Pt denies fever, wt loss, night sweats, loss of appetite, or other constitutional symptoms        Wt Readings from Last 3 Encounters:  03/11/21 167 lb (75.8 kg)  02/28/21 161 lb (73 kg)  01/24/21 174 lb (78.9 kg)   BP Readings from Last 3 Encounters:  03/11/21 128/60  02/28/21 109/63  01/24/21 (!) 162/70         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    BACK PAIN 02/16/2007   COPD (chronic obstructive pulmonary disease) ([place])    Diverticulosis 09/27/2013   FOOT PAIN, CHRONIC 02/27/2008   GERD (gastroesophageal reflux disease)    Hepatitis C antibody test positive    HERPES ZOSTER, HX OF 02/27/2008   HYPERLIPIDEMIA 02/16/2007   HYPERTENSION 02/16/2007   Personal history of colonic polyps 03/08/2007   PROSTATE SPECIFIC ANTIGEN, ELEVATED 02/16/2007   Shortness of breath dyspnea    Past Surgical History:  Procedure Laterality Date   COLONOSCOPY     FOOT SURGERY     reconstruction x's 4 after work accident    reports that he quit smoking about 36 years ago. His smoking use included cigarettes. He has a 7.50 pack-year smoking history. He has never used smokeless  tobacco. He reports that he does not drink alcohol and does not use drugs. family history includes Alcohol abuse in his brother; Cancer in his mother and sister; Coronary artery disease in his father; Hypertension in his father. No Known Allergies Current Outpatient Medications on File Prior to Visit  Medication Sig Dispense Refill   albuterol (VENTOLIN HFA) 108 (90 Base) MCG/ACT inhaler INHALE 1-2 PUFFS INTO THE LUNGS EVERY 6 (SIX) HOURS AS NEEDED FOR WHEEZING OR SHORTNESS OF BREATH. 25.5 each 11   atenolol (TENORMIN) 25 MG tablet Take 1 tablet (25 mg total) by mouth daily. 90 tablet 3   cetirizine (ZYRTEC) 10 MG tablet TAKE 1 TABLET BY MOUTH EVERY DAY 90 tablet 3   citalopram (CELEXA) 10 MG tablet TAKE 1 TABLET BY MOUTH EVERY DAY 90 tablet 3   clonazePAM (KLONOPIN) 0.5 MG tablet TAKE 1 TABLET BY MOUTH TWICE A DAY AS NEEDED FOR ANXIETY 60 tablet 2   famotidine (PEPCID) 20 MG tablet TAKE 1 TABLET BY MOUTH EVERYDAY AT BEDTIME 90 tablet 3   finasteride (PROSCAR) 5 MG tablet Take 1 tablet (5 mg total) by mouth daily. 90 tablet 3   FLUAD QUADRIVALENT 0.5 ML injection      lactulose (CHRONULAC) 10 GM/15ML solution Take 45 mLs (30 g total) by mouth daily as needed for mild  constipation. 236 mL 1   levocetirizine (XYZAL) 5 MG tablet Take 1 tablet (5 mg total) by mouth daily as needed for allergies. 90 tablet 1   losartan-hydrochlorothiazide (HYZAAR) 50-12.5 MG tablet TAKE 1 TABLET BY MOUTH EVERY DAY 90 tablet 3   methimazole (TAPAZOLE) 5 MG tablet TAKE 1 TABLET BY MOUTH 3 TIMES A WEEK. 36 tablet 0   pantoprazole (PROTONIX) 40 MG tablet TAKE 1 TABLET BY MOUTH TWICE A DAY 180 tablet 1   polyethylene glycol powder (GLYCOLAX/MIRALAX) 17 GM/SCOOP powder Take 17 g by mouth daily. 3350 g 1   potassium chloride (KLOR-CON) 8 MEQ tablet Take 1 tablet (8 mEq total) by mouth daily. 90 tablet 3   predniSONE (DELTASONE) 10 MG tablet 3 tabs by mouth per day for 3 days,2tabs per day for 3 days,1tab per day for 3 days  18 tablet 0   rosuvastatin (CRESTOR) 40 MG tablet TAKE 1 TABLET BY MOUTH EVERY DAY 90 tablet 3   simvastatin (ZOCOR) 40 MG tablet Take 1 tablet (40 mg total) by mouth daily. 90 tablet 1   sucralfate (CARAFATE) 1 GM/10ML suspension Take 10 mLs (1 g total) by mouth 4 (four) times daily -  with meals and at bedtime. 420 mL 0   tiZANidine (ZANAFLEX) 4 MG tablet Take 1 tablet (4 mg total) by mouth every 6 (six) hours as needed for muscle spasms. 40 tablet 1   triamcinolone (NASACORT) 55 MCG/ACT AERO nasal inhaler Place 2 sprays into the nose daily. 3 Inhaler 3   No current facility-administered medications on file prior to visit.        ROS:  All others reviewed and negative.  Objective        PE:  BP 128/60 (BP Location: Right Arm, Patient Position: Sitting, Cuff Size: Large)   Pulse 63   Ht 5\' 6"  (1.676 m)   Wt 167 lb (75.8 kg)   SpO2 99%   BMI 26.95 kg/m                 Constitutional: Pt appears in NAD               HENT: Head: NCAT.                Right Ear: External ear normal.                 Left Ear: External ear normal.                Eyes: . Pupils are equal, round, and reactive to light. Conjunctivae and EOM are normal               Nose: without d/c or deformity               Neck: Neck supple. Gross normal ROM               Cardiovascular: Normal rate and regular rhythm.                 Pulmonary/Chest: Effort normal and breath sounds without rales or wheezing.                Abd:  Soft, NT, ND, + BS, no organomegaly               Neurological: Pt is alert. At baseline orientation, motor grossly intact               Right lateral shoulder tender subacromial  Skin: Skin is warm. No rashes, no other new lesions, LE edema - none               Psychiatric: Pt behavior is normal without agitation   Micro: none  Cardiac tracings I have personally interpreted today:  none  Pertinent Radiological findings (summarize): none   Lab Results  Component Value Date    WBC 9.4 02/28/2021   HGB 14.8 02/28/2021   HCT 43.2 02/28/2021   PLT 206 02/28/2021   GLUCOSE 98 02/28/2021   CHOL 170 07/17/2020   TRIG 125.0 07/17/2020   HDL 61.30 07/17/2020   LDLDIRECT 89.0 12/06/2017   LDLCALC 84 07/17/2020   ALT 13 02/28/2021   AST 20 02/28/2021   NA 139 02/28/2021   K 3.8 02/28/2021   CL 102 02/28/2021   CREATININE 1.11 02/28/2021   BUN 20 02/28/2021   CO2 28 02/28/2021   TSH 0.57 07/17/2020   PSA 3.84 07/17/2020   HGBA1C 5.9 07/17/2020   Assessment/Plan:  Jack [name] is a 75 y.o. Black or African American [2] male with  has a past medical history of ALLERGIC RHINITIS (02/16/2007), Arthritis (2013), BACK PAIN (02/16/2007), COPD (chronic obstructive pulmonary disease) ([place]), Diverticulosis (09/27/2013), FOOT PAIN, CHRONIC (02/27/2008), GERD (gastroesophageal reflux disease), Hepatitis C antibody test positive, HERPES ZOSTER, HX OF (02/27/2008), HYPERLIPIDEMIA (02/16/2007), HYPERTENSION (02/16/2007), Personal history of colonic polyps (03/08/2007), PROSTATE SPECIFIC ANTIGEN, ELEVATED (02/16/2007), and Shortness of breath dyspnea.  Aortic atherosclerosis (HCC) Noted, pt to continue efforts at low chol diet, exercise, and crestor 40  Bilateral arm pain Suspect neuritic in nature - for c spine MRI, declines NCS/EMG for now,  to f/u any worsening symptoms or concerns  Bursitis of right shoulder Has localized pain  - for sport med referral,  to f/u any worsening symptoms or concerns  Gastritis Improved symptomatically, to finish carafate asd, cont pepcid and protonix, f/u GI nov 28 and also has colonoscopy for dec 8  Followup: Return in about 3 months (around 06/11/2021).  Cathlean Cower, MD 03/16/2021 4:31 PM [facility]

## 2021-03-14 ENCOUNTER — Encounter: Payer: Self-pay | Admitting: Family Medicine

## 2021-03-16 ENCOUNTER — Encounter: Payer: Self-pay | Admitting: Internal Medicine

## 2021-03-16 NOTE — Assessment & Plan Note (Signed)
Noted, pt to continue efforts at low chol diet, exercise, and crestor 40

## 2021-03-16 NOTE — Assessment & Plan Note (Signed)
Suspect neuritic in nature - for c spine MRI, declines NCS/EMG for now,  to f/u any worsening symptoms or concerns

## 2021-03-16 NOTE — Assessment & Plan Note (Signed)
Has localized pain  - for sport med referral,  to f/u any worsening symptoms or concerns

## 2021-03-16 NOTE — Assessment & Plan Note (Addendum)
Improved symptomatically, to finish carafate asd, cont pepcid and protonix, f/u GI nov 28 and also has colonoscopy for dec 8

## 2021-03-18 ENCOUNTER — Other Ambulatory Visit: Payer: Self-pay | Admitting: Internal Medicine

## 2021-03-18 DIAGNOSIS — E0590 Thyrotoxicosis, unspecified without thyrotoxic crisis or storm: Secondary | ICD-10-CM

## 2021-03-18 NOTE — Telephone Encounter (Signed)
Ok to contact pt'  I refilled the methamozole at 1 mo and 1 refill   But further refills should be from endocrinology - so I referred him back to Dr Loanne Drilling, thanks

## 2021-03-24 ENCOUNTER — Encounter: Payer: Self-pay | Admitting: Nurse Practitioner

## 2021-03-24 ENCOUNTER — Ambulatory Visit: Payer: Medicare Other | Admitting: Nurse Practitioner

## 2021-03-24 VITALS — BP 138/80 | HR 90 | Ht 66.0 in | Wt 175.6 lb

## 2021-03-24 DIAGNOSIS — R1030 Lower abdominal pain, unspecified: Secondary | ICD-10-CM

## 2021-03-24 DIAGNOSIS — Z86010 Personal history of colonic polyps: Secondary | ICD-10-CM

## 2021-03-24 NOTE — Patient Instructions (Signed)
If you are age 75 or older, your body mass index should be between 23-30. Your Body mass index is 28.34 kg/m. If this is out of the aforementioned range listed, please consider follow up with your Primary Care Provider.  The [facility] providers would like to encourage you to use [facility] to communicate with providers for non-urgent requests or questions.  Due to long hold times on the telephone, sending your provider a message by [facility] may be faster and more efficient way to get a response. Please allow 48 business hours for a response.  Please remember that this is for non-urgent requests/questions.  PROCEDURES: You have been scheduled for a colonoscopy. Please follow the written instructions given to you at your visit today. Please pick up your prep supplies at the pharmacy within the next 1-3 days. If you use inhalers (even only as needed), please bring them with you on the day of your procedure.  RECOMMENDATIONS: Miralax- Dissolve one capful in 8 ounces of water and drink before bed as needed. Also take the Miralax 7 nights prior to the day you start the colonoscopy prep. Please contact our office if lower abdominal pain worsen.  It was great seeing you today! Thank you for entrusting me with your care and choosing [facility].  Noralyn Pick, CRNP

## 2021-03-24 NOTE — Progress Notes (Signed)
Subjective:    CC: R shoulder pain  I, Molly Weber, LAT, ATC, am serving as scribe for Dr. Lynne Leader.  HPI: Pt is a 75 y/o male presenting w/ c/o R shoulder pain x 2-3 months w/ no known MOI.  He locates his pain to his R lateral shoulder and upper arm.  Radiating pain: yes into the R upper arm Neck pain: R shoulder mechanical symptoms: no Aggravating factors: laying on R side; R shoulder overhead AROM at end-range Treatments tried: BenGay; IcyHot  Pertinent review of Systems: no fever or chills  Relevant historical information: HTN   Objective:    Vitals:   03/25/21 1041  BP: (!) 148/88  Pulse: 74  SpO2: 95%   General: Well Developed, well nourished, and in no acute distress.   MSK: C-spine: Normal-appearing Nontender. Normal cervical motion. Negative Spurling's test. Upper summary strength is intact.  Right shoulder normal-appearing Nontender. Normal shoulder motion. Strength intact over pain is present with resisted abduction and external rotation. Positive empty can test.  Positive Hawkins and Neer's test. Negative Yergason's and speeds test. Pulses capillary fill and sensation are intact distally.  Lab and Radiology Results  Procedure: Real-time Ultrasound Guided Injection of right shoulder subacromial bursa Device: Philips Affiniti 50G Images permanently stored and available for review in PACS Ultrasound evaluation prior to injection reveals intact rotator cuff tendons. Calcific change present subscapularis tendon. Moderate subacromial bursitis present. AC DJD present. Verbal informed consent obtained.  Discussed risks and benefits of procedure. Warned about infection bleeding damage to structures skin hypopigmentation and fat atrophy among others. Patient expresses understanding and agreement Time-out conducted.   Noted no overlying erythema, induration, or other signs of local infection.   Skin prepped in a sterile fashion.   Local  anesthesia: Topical Ethyl chloride.   With sterile technique and under real time ultrasound guidance: 40 mg of Kenalog and 2 mL of Marcaine injected into subacromial bursa. Fluid seen entering the bursa.   Completed without difficulty   Pain immediately resolved suggesting accurate placement of the medication.   Advised to call if fevers/chills, erythema, induration, drainage, or persistent bleeding.   Images permanently stored and available for review in the ultrasound unit.  Impression: Technically successful ultrasound guided injection.   X-ray images right shoulder obtained today personally and independently interpreted Riding humeral head.  AC DJD.  No acute fractures. Await formal radiology review     Impression and Recommendations:    Assessment and Plan: 75 y.o. male with right shoulder pain thought to be due to subacromial bursitis and impingement.  Plan for subacromial injection and physical therapy referral.  Recheck back in about 6 weeks.  Return sooner if needed.  Precautions reviewed with patient.Marland Kitchen  PDMP not reviewed this encounter. Orders Placed This Encounter  Procedures   [place] LIMITED JOINT SPACE STRUCTURES UP RIGHT(NO LINKED CHARGES)    Order Specific Question:   Reason for Exam (SYMPTOM  OR DIAGNOSIS REQUIRED)    Answer:   R shoulder pain    Order Specific Question:   Preferred imaging location?    Answer:   [facility]   DG Shoulder Right    Standing Status:   Future    Number of Occurrences:   1    Standing Expiration Date:   04/24/2021    Order Specific Question:   Reason for Exam (SYMPTOM  OR DIAGNOSIS REQUIRED)    Answer:   R shoulder pain    Order Specific Question:  Preferred imaging location?    Answer:   [facility]   Ambulatory referral to Physical Therapy    Referral Priority:   Routine    Referral Type:   Physical Medicine    Referral Reason:   Specialty Services Required    Requested Specialty:   Physical  Therapy    Number of Visits Requested:   1   No orders of the defined types were placed in this encounter.   Discussed warning signs or symptoms. Please see discharge instructions. Patient expresses understanding.   The above documentation has been reviewed and is accurate and complete Lynne Leader, M.D.

## 2021-03-24 NOTE — Progress Notes (Signed)
n    03/24/2021 Jack [name] [id number] [birth year]   CHIEF COMPLAINT:  Lower abdominal pain, schedule a colonoscopy   HISTORY OF PRESENT ILLNESS: Jack [name] is a 75 year old male with a past medial history of arthritis, hypertension, hyperlipidemia, COPD, GERD and colon polyps. He is followed by Dr. Carlean Purl.  He presented to [facility] ED on 02/28/2021 after he developed abdominal pain described as cramp-like after eating ribs.  Labs showed a normal WBC level of 9.4. Hg 14.8. Normal LFTs. An abdominal x-ray showed a moderate amount of stool and gas without evidence of obstruction. He was prescribed Carafate 1gm po tid.  He remains on Pantoprazole 40mg  po bid and Famotidine 20mg  QHS.   He presents today accompanied by his brother who assists with communication as the patient is heard of hearing and did not wear his hearing aids today. He denies having any dysphagia or heartburn. He is passing a normal brown stool daily. No rectal  bleeding or black stools. He does not feel constipated.   He underwent an EGD 06/14/2015 which was normal.  His prior dysphagia resolved prior to his EGD date.  He underwent a colonoscopy 06/14/2015 as well which identified two 4 to 5 mm tubular adenomatous polyps which were removed from the sigmoid and ascending colon.  A repeat colonoscopy in 5 years was recommended at that time.  He complains of bilateral arm numbness, referred to ortho/sport medicine.    CBC Latest Ref Rng & Units 02/28/2021 01/18/2021 07/17/2020  WBC 4.0 - 10.5 K/uL 9.4 8.7 9.2  Hemoglobin 13.0 - 17.0 g/dL 14.8 14.3 14.5  Hematocrit 39.0 - 52.0 % 43.2 41.0 42.2  Platelets 150 - 400 K/uL 206 170 193.0    CMP Latest Ref Rng & Units 02/28/2021 01/18/2021 07/17/2020  Glucose 70 - 99 mg/dL 98 117(H) 101(H)  BUN 8 - 23 mg/dL 20 12 23   Creatinine 0.61 - 1.24 mg/dL 1.11 1.13 1.40  Sodium 135 - 145 mmol/L 139 137 138  Potassium 3.5 - 5.1 mmol/L 3.8 3.2(L) 4.0  Chloride 98 - 111 mmol/L  102 101 102  CO2 22 - 32 mmol/L 28 28 28   Calcium 8.9 - 10.3 mg/dL 10.2 10.1 9.6  Total Protein 6.5 - 8.1 g/dL 7.8 7.2 7.3  Total Bilirubin 0.3 - 1.2 mg/dL 0.8 0.6 0.7  Alkaline Phos 38 - 126 U/L 65 60 80  AST 15 - 41 U/L 20 17 22   ALT 0 - 44 U/L 13 6 13     EGD 06/14/2015: Normal appearing esophagus and GE junction, the stomach was well visualized and normal in appearance, normal appearing duodenum  Colonoscopy 06/14/2015: 1. Two sessile polyps ranging from 4 to 79mm in size were found in the sigmoid colon and ascending colon; polypectomies were performed with a cold snare 2. Diverticulosis was noted in the sigmoid colon 3. The examination was otherwise normal - 5 year colonoscopy recall - TUBULAR ADENOMA (2). NO HIGH GRADE DYSPLASIA OR MALIGNANCY IDENTIFIED.  Colonoscopy 03/08/2007: 3 polyps removed from the descending and sigmoid colon Multiple polyps measuring 6 to 12 mm removed from the cecum and ascending colon Diverticulosis to the ascending, descending and sigmoid colon. Internal and external hemorrhoid  1. COLON, CECAL AND ASCENDING POLYPS: TUBULAR ADENOMAS. NO   HIGH GRADE DYSPLASIA OR MALIGNANCY IDENTIFIED.   2. COLON, DESCENDING POLYPS: TUBULAR ADENOMAS. NO HIGH GRADE   DYSPLASIA OR MALIGNANCY IDENTIFIED.   ECHO 04/09/2016: - Left ventricle: The cavity size was normal. Wall thickness  was    increased in a pattern of mild LVH. Systolic function was    vigorous. The estimated ejection fraction was in the range of 65%    to 70%. Wall motion was normal; there were no regional wall    motion abnormalities. Doppler parameters are consistent with    abnormal left ventricular relaxation (grade 1 diastolic    dysfunction).   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    BACK PAIN 02/16/2007   COPD (chronic obstructive pulmonary disease) ([place])    Diverticulosis 09/27/2013   FOOT PAIN, CHRONIC 02/27/2008   GERD  (gastroesophageal reflux disease)    Hepatitis C antibody test positive    HERPES ZOSTER, HX OF 02/27/2008   HYPERLIPIDEMIA 02/16/2007   HYPERTENSION 02/16/2007   Personal history of colonic polyps 03/08/2007   PROSTATE SPECIFIC ANTIGEN, ELEVATED 02/16/2007   Shortness of breath dyspnea    Past Surgical History:  Procedure Laterality Date   COLONOSCOPY     FOOT SURGERY     reconstruction x's 4 after work accident   Social History: He is married. He has 2 sons and one daughter. He is retired. Nonsmoker. No alcohol use. No drug use.   Family History: Father with HTN and heart disease. Brother with alcohol abuse. No known family history of esophageal, gastric or colon cancer.   No Known Allergies    Outpatient Encounter Medications as of 03/24/2021  Medication Sig   albuterol (VENTOLIN HFA) 108 (90 Base) MCG/ACT inhaler INHALE 1-2 PUFFS INTO THE LUNGS EVERY 6 (SIX) HOURS AS NEEDED FOR WHEEZING OR SHORTNESS OF BREATH.   atenolol (TENORMIN) 25 MG tablet Take 1 tablet (25 mg total) by mouth daily.   cetirizine (ZYRTEC) 10 MG tablet TAKE 1 TABLET BY MOUTH EVERY DAY   citalopram (CELEXA) 10 MG tablet TAKE 1 TABLET BY MOUTH EVERY DAY   clonazePAM (KLONOPIN) 0.5 MG tablet TAKE 1 TABLET BY MOUTH TWICE A DAY AS NEEDED FOR ANXIETY   famotidine (PEPCID) 20 MG tablet TAKE 1 TABLET BY MOUTH EVERYDAY AT BEDTIME   finasteride (PROSCAR) 5 MG tablet Take 1 tablet (5 mg total) by mouth daily.   FLUAD QUADRIVALENT 0.5 ML injection    lactulose (CHRONULAC) 10 GM/15ML solution Take 45 mLs (30 g total) by mouth daily as needed for mild constipation.   levocetirizine (XYZAL) 5 MG tablet Take 1 tablet (5 mg total) by mouth daily as needed for allergies.   losartan-hydrochlorothiazide (HYZAAR) 50-12.5 MG tablet TAKE 1 TABLET BY MOUTH EVERY DAY   methimazole (TAPAZOLE) 5 MG tablet TAKE 1 TABLET BY MOUTH THREE TIMES A WEEK   pantoprazole (PROTONIX) 40 MG tablet TAKE 1 TABLET BY MOUTH TWICE A DAY    polyethylene glycol powder (GLYCOLAX/MIRALAX) 17 GM/SCOOP powder Take 17 g by mouth daily.   potassium chloride (KLOR-CON) 8 MEQ tablet Take 1 tablet (8 mEq total) by mouth daily.   predniSONE (DELTASONE) 10 MG tablet 3 tabs by mouth per day for 3 days,2tabs per day for 3 days,1tab per day for 3 days   rosuvastatin (CRESTOR) 40 MG tablet TAKE 1 TABLET BY MOUTH EVERY DAY   simvastatin (ZOCOR) 40 MG tablet Take 1 tablet (40 mg total) by mouth daily.   sucralfate (CARAFATE) 1 GM/10ML suspension Take 10 mLs (1 g total) by mouth 4 (four) times daily -  with meals and at bedtime.   tiZANidine (ZANAFLEX) 4 MG tablet Take 1 tablet (4 mg  total) by mouth every 6 (six) hours as needed for muscle spasms.   triamcinolone (NASACORT) 55 MCG/ACT AERO nasal inhaler Place 2 sprays into the nose daily.   No facility-administered encounter medications on file as of 03/24/2021.   REVIEW OF SYSTEMS:  Gen: Denies fever, sweats or chills. No weight loss.  CV: Denies chest pain, palpitations or edema. Resp: Denies cough, shortness of breath of hemoptysis.  GI: See HPI.  GU : Denies urinary burning, blood in urine, increased urinary frequency or incontinence. MS: Denies joint pain, muscles aches or weakness. Derm: Denies rash, itchiness, skin lesions or unhealing ulcers. Psych: Denies depression, anxiety, memory loss, suicidal ideation and confusion. Heme: Denies bruising, bleeding. Neuro:  Denies headaches, dizziness or paresthesias. Endo:  Denies any problems with DM, thyroid or adrenal function.  PHYSICAL EXAM: BP 138/80   Pulse 90   Ht 5\' 6"  (1.676 m)   Wt 175 lb 9.6 oz (79.7 kg)   SpO2 98%   BMI 28.34 kg/m  General: 75 year old male, HOH in NAD.  Head: Normocephalic and atraumatic. Eyes:  Sclerae non-icteric, conjunctive pink. Ears: Normal auditory acuity. Mouth: Upper dentures. No ulcers or lesions.  Neck: Supple, no lymphadenopathy or thyromegaly.  Lungs: Clear bilaterally to auscultation without  wheezes, crackles or rhonchi. Heart: Regular rate and rhythm. No murmur, rub or gallop appreciated.  Abdomen: Soft, nontender, non distended. No masses. No hepatosplenomegaly. Normoactive bowel sounds x 4 quadrants.  Rectal: Deferred.  Musculoskeletal: Symmetrical with no gross deformities. Skin: Warm and dry. No rash or lesions on visible extremities. Extremities: No edema. Neurological: Alert oriented x 4, no focal deficits.  Psychological:  Alert and cooperative. Normal mood and affect.  ASSESSMENT AND PLAN:  29) 75 year old male with a history of tubular adenomatous colon polyps per colonoscopy 2008 and 2017. -Colonoscopy benefits and risks discussed including risk with sedation, risk of bleeding, perforation and infection  -Miralax Q HS x 7 nights prior to colonoscopy prep date  -Further recommendations to be determined after colonoscopy completed   2) Constipation with associated bloat and lower abdominal pain. No abdominal tenderness on exam. -Miralax Q HS as tolerated  CTAP if abdominal pain persists or worsens  -See plan in # 1  3) History of GERD, stable. No dysphagia. Denies having any upper abdominal pain.  He reported having diffuse lower abdominal pain when he presented to the ED on 02/28/2021. Normal EGD in 2017.  -Continue Pantoprazole 40mg  bid        CC:  Biagio Borg, MD

## 2021-03-25 ENCOUNTER — Ambulatory Visit (INDEPENDENT_AMBULATORY_CARE_PROVIDER_SITE_OTHER): Payer: Medicare Other

## 2021-03-25 ENCOUNTER — Encounter: Payer: Self-pay | Admitting: Family Medicine

## 2021-03-25 ENCOUNTER — Other Ambulatory Visit: Payer: Self-pay

## 2021-03-25 ENCOUNTER — Ambulatory Visit: Payer: Self-pay

## 2021-03-25 ENCOUNTER — Ambulatory Visit (INDEPENDENT_AMBULATORY_CARE_PROVIDER_SITE_OTHER): Payer: Medicare Other | Admitting: Family Medicine

## 2021-03-25 VITALS — BP 148/88 | HR 74 | Ht 66.0 in | Wt 175.0 lb

## 2021-03-25 DIAGNOSIS — M7551 Bursitis of right shoulder: Secondary | ICD-10-CM | POA: Diagnosis not present

## 2021-03-25 DIAGNOSIS — M25511 Pain in right shoulder: Secondary | ICD-10-CM

## 2021-03-25 NOTE — Patient Instructions (Addendum)
Nice to meet you today.  You had a R shoulder injection today.  Call or go to the ER if you develop a large red swollen joint with extreme pain or oozing puss.   I've referred you to Physical Therapy.  Please let us know if you haven't heard from them in the next week regarding scheduling.  Please get an Xray today before you leave.  Follow-up: 6 weeks

## 2021-03-27 NOTE — Progress Notes (Signed)
Right shoulder x-ray shows some arthritis

## 2021-04-04 ENCOUNTER — Encounter: Payer: Self-pay | Admitting: Internal Medicine

## 2021-04-04 ENCOUNTER — Ambulatory Visit (AMBULATORY_SURGERY_CENTER): Payer: Medicare Other | Admitting: Internal Medicine

## 2021-04-04 VITALS — BP 107/71 | HR 78 | Temp 99.1°F | Resp 23 | Ht 66.0 in | Wt 175.0 lb

## 2021-04-04 DIAGNOSIS — Z86010 Personal history of colonic polyps: Secondary | ICD-10-CM | POA: Diagnosis not present

## 2021-04-04 DIAGNOSIS — D123 Benign neoplasm of transverse colon: Secondary | ICD-10-CM

## 2021-04-04 MED ORDER — SODIUM CHLORIDE 0.9 % IV SOLN: 500.0000 mL | Freq: Once | INTRAVENOUS | Status: DC

## 2021-04-04 NOTE — Op Note (Signed)
[facility] Patient Name: Jack [name] Procedure Date: 04/04/2021 10:37 AM MRN: [id number] Endoscopist: Gatha Mayer , MD Age: 75 Referring MD:  Date of Birth: 12/04/1945 Gender: Male Account #: [id number] Procedure:                Colonoscopy Indications:              Surveillance: Personal history of adenomatous                            polyps on last colonoscopy 5 years ago, Last                            colonoscopy: 2017 Medicines:                Propofol per Anesthesia, Monitored Anesthesia Care Procedure:                Pre-Anesthesia Assessment:                           - Prior to the procedure, a History and Physical                            was performed, and patient medications and                            allergies were reviewed. The patient's tolerance of                            previous anesthesia was also reviewed. The risks                            and benefits of the procedure and the sedation                            options and risks were discussed with the patient.                            All questions were answered, and informed consent                            was obtained. Prior Anticoagulants: The patient has                            taken no previous anticoagulant or antiplatelet                            agents. ASA Grade Assessment: III - A patient with                            severe systemic disease. After reviewing the risks                            and benefits, the patient was deemed in  satisfactory condition to undergo the procedure.                           After obtaining informed consent, the colonoscope                            was passed under direct vision. Throughout the                            procedure, the patient's blood pressure, pulse, and                            oxygen saturations were monitored continuously. The                            Olympus CF-HQ190L (Serial#  [id number]) Colonoscope was                            introduced through the anus and advanced to the the                            cecum, identified by appendiceal orifice and                            ileocecal valve. The colonoscopy was performed                            without difficulty. The patient tolerated the                            procedure well. The quality of the bowel                            preparation was good. The ileocecal valve,                            appendiceal orifice, and rectum were photographed.                            The bowel preparation used was Miralax via split                            dose instruction. Scope In: 10:56:52 AM Scope Out: 11:12:17 AM Scope Withdrawal Time: 0 hours 10 minutes 55 seconds  Total Procedure Duration: 0 hours 15 minutes 25 seconds  Findings:                 The perianal and digital rectal examinations were                            normal.                           A diminutive polyp was found in the transverse  colon. The polyp was sessile. The polyp was removed                            with a cold snare. Resection and retrieval were                            complete. Verification of patient identification                            for the specimen was done. Estimated blood loss was                            minimal.                           Multiple diverticula were found in the sigmoid                            colon and descending colon.                           The exam was otherwise without abnormality on                            direct and retroflexion views. Complications:            No immediate complications. Estimated Blood Loss:     Estimated blood loss was minimal. Impression:               - One diminutive polyp in the transverse colon,                            removed with a cold snare. Resected and retrieved.                           - Diverticulosis in the  sigmoid colon and in the                            descending colon.                           - The examination was otherwise normal on direct                            and retroflexion views.                           - Personal history of colonic polyps. 2008 x 3 and                            2017 x 2                           - no cause of lower abdominal soreness seen - CT  was negative in Sept except BPH Recommendation:           - Patient has a contact number available for                            emergencies. The signs and symptoms of potential                            delayed complications were discussed with the                            patient. Return to normal activities tomorrow.                            Written discharge instructions were provided to the                            patient.                           - Resume previous diet.                           - Continue present medications.                           - Await pathology results.                           - No repeat colonoscopy due to age. Gatha Mayer, MD 04/04/2021 11:26:53 AM This report has been signed electronically.

## 2021-04-04 NOTE — Progress Notes (Signed)
Pt's states no medical or surgical changes since previsit or office visit. 

## 2021-04-04 NOTE — Progress Notes (Signed)
PT taken to PACU. Monitors in place. VSS. Report given to RN. 

## 2021-04-04 NOTE — Progress Notes (Signed)
Called to room to assist during endoscopic procedure.  Patient ID and intended procedure confirmed with present staff. Received instructions for my participation in the procedure from the performing physician.  

## 2021-04-04 NOTE — Progress Notes (Signed)
History and Physical Interval Note:  04/04/2021 10:46 AM  Jack [name]  has presented today for endoscopic procedure(s), with the diagnosis of  Encounter Diagnosis  Name Primary?   History of colonic polyps Yes  .  The various methods of evaluation and treatment have been discussed with the patient and/or family. After consideration of risks, benefits and other options for treatment, the patient has consented to  the endoscopic procedure(s).   The patient's history has been reviewed, patient examined, no change in status, stable for endoscopic procedure(s).  I have reviewed the patient's chart and labs.  Questions were answered to the patient's satisfaction.     Gatha Mayer, MD, [facility]

## 2021-04-04 NOTE — Patient Instructions (Addendum)
I found and removed one tiny polyp. It looks benign - wil be analyzed. I saw your diverticulosis again.  I did not find a cause for your abdominal soreness. It may be in the muscles.  I appreciate the opportunity to care for you. Gatha Mayer, MD, FACG    YOU HAD AN ENDOSCOPIC PROCEDURE TODAY AT [facility]:   Refer to the procedure report that was given to you for any specific questions about what was found during the examination.  If the procedure report does not answer your questions, please call your gastroenterologist to clarify.  If you requested that your care partner not be given the details of your procedure findings, then the procedure report has been included in a sealed envelope for you to review at your convenience later.  YOU SHOULD EXPECT: Some feelings of bloating in the abdomen. Passage of more gas than usual.  Walking can help get rid of the air that was put into your GI tract during the procedure and reduce the bloating. If you had a lower endoscopy (such as a colonoscopy or flexible sigmoidoscopy) you may notice spotting of blood in your stool or on the toilet paper. If you underwent a bowel prep for your procedure, you may not have a normal bowel movement for a few days.  Please Note:  You might notice some irritation and congestion in your nose or some drainage.  This is from the oxygen used during your procedure.  There is no need for concern and it should clear up in a day or so.  SYMPTOMS TO REPORT IMMEDIATELY:  Following lower endoscopy (colonoscopy or flexible sigmoidoscopy):  Excessive amounts of blood in the stool  Significant tenderness or worsening of abdominal pains  Swelling of the abdomen that is new, acute  Fever of 100F or higher  For urgent or emergent issues, a gastroenterologist can be reached at any hour by calling [phone]. Do not use MyChart messaging for urgent concerns.    DIET:  We do recommend a small meal at first,  but then you may proceed to your regular diet.  Drink plenty of fluids but you should avoid alcoholic beverages for 24 hours.  ACTIVITY:  You should plan to take it easy for the rest of today and you should NOT DRIVE or use heavy machinery until tomorrow (because of the sedation medicines used during the test).    FOLLOW UP: Our staff will call the number listed on your records 48-72 hours following your procedure to check on you and address any questions or concerns that you may have regarding the information given to you following your procedure. If we do not reach you, we will leave a message.  We will attempt to reach you two times.  During this call, we will ask if you have developed any symptoms of COVID 19. If you develop any symptoms (ie: fever, flu-like symptoms, shortness of breath, cough etc.) before then, please call [phone].  If you test positive for Covid 19 in the 2 weeks post procedure, please call and report this information to [place].    If any biopsies were taken you will be contacted by phone or by letter within the next 1-3 weeks.  Please call us at [phone] if you have not heard about the biopsies in 3 weeks.    SIGNATURES/CONFIDENTIALITY: You and/or your care partner have signed paperwork which will be entered into your electronic medical record.  These signatures attest to the  fact that that the information above on your After Visit Summary has been reviewed and is understood.  Full responsibility of the confidentiality of this discharge information lies with you and/or your care-partner.

## 2021-04-08 ENCOUNTER — Other Ambulatory Visit: Payer: Self-pay | Admitting: Internal Medicine

## 2021-04-08 ENCOUNTER — Telehealth: Payer: Self-pay | Admitting: *Deleted

## 2021-04-08 NOTE — Telephone Encounter (Signed)
Tapazole refilled x 1 mo only  Further refills need to come per endocrinology  Let me know if I need to refer

## 2021-04-08 NOTE — Telephone Encounter (Signed)
  Follow up Call-  Call back number 04/04/2021  Post procedure Call Back phone  # [phone]  Permission to leave phone message Yes  Some recent data might be hidden   No answer, no machine to leave message

## 2021-04-10 ENCOUNTER — Ambulatory Visit (INDEPENDENT_AMBULATORY_CARE_PROVIDER_SITE_OTHER): Payer: Medicare Other | Admitting: Internal Medicine

## 2021-04-10 ENCOUNTER — Encounter: Payer: Self-pay | Admitting: Internal Medicine

## 2021-04-10 ENCOUNTER — Other Ambulatory Visit: Payer: Self-pay

## 2021-04-10 VITALS — BP 120/70 | HR 89 | Temp 98.1°F | Ht 66.0 in | Wt 160.0 lb

## 2021-04-10 DIAGNOSIS — R739 Hyperglycemia, unspecified: Secondary | ICD-10-CM | POA: Diagnosis not present

## 2021-04-10 DIAGNOSIS — M545 Low back pain, unspecified: Secondary | ICD-10-CM | POA: Diagnosis not present

## 2021-04-10 DIAGNOSIS — G8929 Other chronic pain: Secondary | ICD-10-CM

## 2021-04-10 DIAGNOSIS — K219 Gastro-esophageal reflux disease without esophagitis: Secondary | ICD-10-CM

## 2021-04-10 DIAGNOSIS — I10 Essential (primary) hypertension: Secondary | ICD-10-CM

## 2021-04-10 MED ORDER — TRAMADOL HCL 50 MG PO TABS
50.0000 mg | ORAL_TABLET | Freq: Four times a day (QID) | ORAL | 0 refills | Status: DC | PRN
Start: 2021-04-10 — End: 2021-04-28

## 2021-04-10 MED ORDER — TIZANIDINE HCL 4 MG PO TABS
4.0000 mg | ORAL_TABLET | Freq: Four times a day (QID) | ORAL | 1 refills | Status: DC | PRN
Start: 2021-04-10 — End: 2022-01-08

## 2021-04-10 MED ORDER — PANTOPRAZOLE SODIUM 40 MG PO TBEC: 40.0000 mg | DELAYED_RELEASE_TABLET | Freq: Two times a day (BID) | ORAL | 3 refills | Status: DC

## 2021-04-10 NOTE — Patient Instructions (Signed)
Please take all new medication as prescribed - the protonix as prescribed  Please take all new medication as prescribed - the muscle relaxer and tramadol as needed for pain  Please continue all other medications as before, and refills have been done if requested.  Please have the pharmacy call with any other refills you may need.  Please continue your efforts at being more active, low cholesterol diet, and weight control.  Please keep your appointments with your specialists as you may have planned  Please make an Appointment to return in 3 months, or sooner if needed

## 2021-04-10 NOTE — Progress Notes (Signed)
Patient ID: Jack [name], male   DOB: Aug 18, 1945, 75 y.o.   MRN: [id number]        Chief Complaint: follow up HTN, and hyperglycemia,  chronic low back pain, and new reflux       HPI:  Jack [name] is a 75 y.o. male here overall doing ok , but has had mild worsening reflux with sour brash, but no abd pain, dysphagia, n/v, bowel change or blood. Pt denies chest pain, increased sob or doe, wheezing, orthopnea, PND, increased LE swelling, palpitations, dizziness or syncope.   Pt denies polydipsia, polyuria, or new focal neuro s/s.   Pt denies fever, wt loss, night sweats, loss of appetite, or other constitutional symptoms  Pt continues to have recurring right LBP without change in severity, bowel or bladder change, fever, wt loss,  worsening LE pain/numbness/weakness, gait change or falls.       Wt Readings from Last 3 Encounters:  04/10/21 160 lb (72.6 kg)  04/04/21 175 lb (79.4 kg)  03/25/21 175 lb (79.4 kg)   BP Readings from Last 3 Encounters:  04/10/21 120/70  04/04/21 107/71  03/25/21 (!) 148/88         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    BACK PAIN 02/16/2007   COPD (chronic obstructive pulmonary disease) ([place])    Diverticulosis 09/27/2013   FOOT PAIN, CHRONIC 02/27/2008   GERD (gastroesophageal reflux disease)    Hepatitis C antibody test positive    HERPES ZOSTER, HX OF 02/27/2008   HYPERLIPIDEMIA 02/16/2007   HYPERTENSION 02/16/2007   Personal history of colonic polyps 03/08/2007   PROSTATE SPECIFIC ANTIGEN, ELEVATED 02/16/2007   Shortness of breath dyspnea    Past Surgical History:  Procedure Laterality Date   COLONOSCOPY     FOOT SURGERY     reconstruction x's 4 after work accident    reports that he quit smoking about 36 years ago. His smoking use included cigarettes. He has a 7.50 pack-year smoking history. He has never used smokeless tobacco. He reports that he does not drink alcohol and does not use  drugs. family history includes Alcohol abuse in his brother; Cancer in his mother and sister; Coronary artery disease in his father; Hypertension in his father. No Known Allergies Current Outpatient Medications on File Prior to Visit  Medication Sig Dispense Refill   albuterol (VENTOLIN HFA) 108 (90 Base) MCG/ACT inhaler INHALE 1-2 PUFFS INTO THE LUNGS EVERY 6 (SIX) HOURS AS NEEDED FOR WHEEZING OR SHORTNESS OF BREATH. 25.5 each 11   atenolol (TENORMIN) 25 MG tablet Take 1 tablet (25 mg total) by mouth daily. 90 tablet 3   cetirizine (ZYRTEC) 10 MG tablet TAKE 1 TABLET BY MOUTH EVERY DAY 90 tablet 3   citalopram (CELEXA) 10 MG tablet TAKE 1 TABLET BY MOUTH EVERY DAY 90 tablet 3   clonazePAM (KLONOPIN) 0.5 MG tablet TAKE 1 TABLET BY MOUTH TWICE A DAY AS NEEDED FOR ANXIETY 60 tablet 2   famotidine (PEPCID) 20 MG tablet TAKE 1 TABLET BY MOUTH EVERYDAY AT BEDTIME 90 tablet 3   finasteride (PROSCAR) 5 MG tablet Take 1 tablet (5 mg total) by mouth daily. 90 tablet 3   lactulose (CHRONULAC) 10 GM/15ML solution Take 45 mLs (30 g total) by mouth daily as needed for mild constipation. 236 mL 1   levocetirizine (XYZAL) 5 MG tablet Take 1 tablet (5 mg total) by mouth daily as needed for allergies.  90 tablet 1   losartan-hydrochlorothiazide (HYZAAR) 50-12.5 MG tablet TAKE 1 TABLET BY MOUTH EVERY DAY 90 tablet 3   methimazole (TAPAZOLE) 5 MG tablet TAKE 1 TABLET BY MOUTH THREE TIMES A WEEK. 12 tablet 0   polyethylene glycol powder (GLYCOLAX/MIRALAX) 17 GM/SCOOP powder Take 17 g by mouth daily. 3350 g 1   potassium chloride (KLOR-CON) 8 MEQ tablet Take 1 tablet (8 mEq total) by mouth daily. 90 tablet 3   predniSONE (DELTASONE) 10 MG tablet 3 tabs by mouth per day for 3 days,2tabs per day for 3 days,1tab per day for 3 days 18 tablet 0   rosuvastatin (CRESTOR) 40 MG tablet TAKE 1 TABLET BY MOUTH EVERY DAY 90 tablet 3   simvastatin (ZOCOR) 40 MG tablet Take 1 tablet (40 mg total) by mouth daily. 90 tablet 1    sucralfate (CARAFATE) 1 GM/10ML suspension Take 10 mLs (1 g total) by mouth 4 (four) times daily -  with meals and at bedtime. 420 mL 0   triamcinolone (NASACORT) 55 MCG/ACT AERO nasal inhaler Place 2 sprays into the nose daily. 3 Inhaler 3   No current facility-administered medications on file prior to visit.        ROS:  All others reviewed and negative.  Objective        PE:  BP 120/70 (BP Location: Right Arm, Patient Position: Sitting, Cuff Size: Normal)    Pulse 89    Temp 98.1 F (36.7 C) (Oral)    Ht 5\' 6"  (1.676 m)    Wt 160 lb (72.6 kg)    SpO2 98%    BMI 25.82 kg/m                 Constitutional: Pt appears in NAD               HENT: Head: NCAT.                Right Ear: External ear normal.                 Left Ear: External ear normal.                Eyes: . Pupils are equal, round, and reactive to light. Conjunctivae and EOM are normal               Nose: without d/c or deformity               Neck: Neck supple. Gross normal ROM               Cardiovascular: Normal rate and regular rhythm.                 Pulmonary/Chest: Effort normal and breath sounds without rales or wheezing.                Abd:  Soft, NT, ND, + BS, no organomegaly; spine nontender but has mild right lumbar paravertebral tender spasm               Neurological: Pt is alert. At baseline orientation, motor grossly intact               Skin: Skin is warm. No rashes, no other new lesions, LE edema - none               Psychiatric: Pt behavior is normal without agitation   Micro: none  Cardiac tracings I have personally interpreted today:  none  Pertinent Radiological findings (summarize):  none   Lab Results  Component Value Date   WBC 9.4 02/28/2021   HGB 14.8 02/28/2021   HCT 43.2 02/28/2021   PLT 206 02/28/2021   GLUCOSE 98 02/28/2021   CHOL 170 07/17/2020   TRIG 125.0 07/17/2020   HDL 61.30 07/17/2020   LDLDIRECT 89.0 12/06/2017   LDLCALC 84 07/17/2020   ALT 13 02/28/2021   AST 20  02/28/2021   NA 139 02/28/2021   K 3.8 02/28/2021   CL 102 02/28/2021   CREATININE 1.11 02/28/2021   BUN 20 02/28/2021   CO2 28 02/28/2021   TSH 0.57 07/17/2020   PSA 3.84 07/17/2020   HGBA1C 5.9 07/17/2020   Assessment/Plan:  Jack [name] is a 75 y.o. Black or African American [2] male with  has a past medical history of ALLERGIC RHINITIS (02/16/2007), Arthritis (2013), BACK PAIN (02/16/2007), COPD (chronic obstructive pulmonary disease) ([place]), Diverticulosis (09/27/2013), FOOT PAIN, CHRONIC (02/27/2008), GERD (gastroesophageal reflux disease), Hepatitis C antibody test positive, HERPES ZOSTER, HX OF (02/27/2008), HYPERLIPIDEMIA (02/16/2007), HYPERTENSION (02/16/2007), Personal history of colonic polyps (03/08/2007), PROSTATE SPECIFIC ANTIGEN, ELEVATED (02/16/2007), and Shortness of breath dyspnea.  Backache Chronic recurrent, mod, for tramadol prn, and refill zanaflex prn,  to f/u any worsening symptoms or concerns  GERD (gastroesophageal reflux disease) Recent onset worsening, for protonix 40 qd  Hyperglycemia Lab Results  Component Value Date   HGBA1C 5.9 07/17/2020   Stable, pt to continue current medical treatment  - diet   Essential hypertension BP Readings from Last 3 Encounters:  04/10/21 120/70  04/04/21 107/71  03/25/21 (!) 148/88   Stable, pt to continue medical treatment tenormin, hyzaar  Followup: Return in about 3 months (around 07/09/2021).  Cathlean Cower, MD 04/13/2021 7:01 PM [facility]

## 2021-04-11 ENCOUNTER — Encounter: Payer: Self-pay | Admitting: Internal Medicine

## 2021-04-13 ENCOUNTER — Encounter: Payer: Self-pay | Admitting: Internal Medicine

## 2021-04-13 NOTE — Assessment & Plan Note (Signed)
Lab Results  Component Value Date   HGBA1C 5.9 07/17/2020   Stable, pt to continue current medical treatment  - diet

## 2021-04-13 NOTE — Assessment & Plan Note (Signed)
Recent onset worsening, for protonix 40 qd

## 2021-04-13 NOTE — Assessment & Plan Note (Signed)
BP Readings from Last 3 Encounters:  04/10/21 120/70  04/04/21 107/71  03/25/21 (!) 148/88   Stable, pt to continue medical treatment tenormin, hyzaar

## 2021-04-13 NOTE — Assessment & Plan Note (Signed)
Chronic recurrent, mod, for tramadol prn, and refill zanaflex prn,  to f/u any worsening symptoms or concerns

## 2021-04-15 ENCOUNTER — Ambulatory Visit (HOSPITAL_BASED_OUTPATIENT_CLINIC_OR_DEPARTMENT_OTHER): Payer: Medicare Other | Attending: Family Medicine | Admitting: Physical Therapy

## 2021-04-27 ENCOUNTER — Other Ambulatory Visit: Payer: Self-pay | Admitting: Internal Medicine

## 2021-04-30 ENCOUNTER — Encounter: Payer: Self-pay | Admitting: Internal Medicine

## 2021-04-30 ENCOUNTER — Other Ambulatory Visit: Payer: Self-pay

## 2021-04-30 ENCOUNTER — Other Ambulatory Visit: Payer: Self-pay | Admitting: Internal Medicine

## 2021-04-30 ENCOUNTER — Ambulatory Visit (INDEPENDENT_AMBULATORY_CARE_PROVIDER_SITE_OTHER): Payer: Medicare Other | Admitting: Internal Medicine

## 2021-04-30 ENCOUNTER — Ambulatory Visit (INDEPENDENT_AMBULATORY_CARE_PROVIDER_SITE_OTHER): Payer: Medicare Other

## 2021-04-30 VITALS — BP 116/60 | HR 54 | Temp 98.6°F | Ht 66.0 in | Wt 163.0 lb

## 2021-04-30 DIAGNOSIS — I10 Essential (primary) hypertension: Secondary | ICD-10-CM

## 2021-04-30 DIAGNOSIS — R1010 Upper abdominal pain, unspecified: Secondary | ICD-10-CM

## 2021-04-30 DIAGNOSIS — R079 Chest pain, unspecified: Secondary | ICD-10-CM | POA: Diagnosis not present

## 2021-04-30 DIAGNOSIS — Z0001 Encounter for general adult medical examination with abnormal findings: Secondary | ICD-10-CM | POA: Diagnosis not present

## 2021-04-30 DIAGNOSIS — E559 Vitamin D deficiency, unspecified: Secondary | ICD-10-CM

## 2021-04-30 DIAGNOSIS — R739 Hyperglycemia, unspecified: Secondary | ICD-10-CM | POA: Diagnosis not present

## 2021-04-30 DIAGNOSIS — I700 Atherosclerosis of aorta: Secondary | ICD-10-CM

## 2021-04-30 DIAGNOSIS — E538 Deficiency of other specified B group vitamins: Secondary | ICD-10-CM

## 2021-04-30 DIAGNOSIS — E7800 Pure hypercholesterolemia, unspecified: Secondary | ICD-10-CM | POA: Diagnosis not present

## 2021-04-30 LAB — URINALYSIS, ROUTINE W REFLEX MICROSCOPIC
Bilirubin Urine: NEGATIVE
Hgb urine dipstick: NEGATIVE
Ketones, ur: NEGATIVE
Leukocytes,Ua: NEGATIVE
Nitrite: NEGATIVE
RBC / HPF: NONE SEEN (ref 0–?)
Specific Gravity, Urine: 1.025 (ref 1.000–1.030)
Total Protein, Urine: NEGATIVE
Urine Glucose: NEGATIVE
Urobilinogen, UA: 0.2 (ref 0.0–1.0)
pH: 6 (ref 5.0–8.0)

## 2021-04-30 LAB — BASIC METABOLIC PANEL
BUN: 19 mg/dL (ref 6–23)
CO2: 28 mEq/L (ref 19–32)
Calcium: 9.6 mg/dL (ref 8.4–10.5)
Chloride: 104 mEq/L (ref 96–112)
Creatinine, Ser: 1.28 mg/dL (ref 0.40–1.50)
GFR: 54.71 mL/min — ABNORMAL LOW (ref >60.00)
Glucose, Bld: 100 mg/dL — ABNORMAL HIGH (ref 70–99)
Potassium: 4.1 mEq/L (ref 3.5–5.1)
Sodium: 140 mEq/L (ref 135–145)

## 2021-04-30 LAB — LIPID PANEL
Cholesterol: 160 mg/dL (ref 0–200)
HDL: 63 mg/dL (ref >39.00)
LDL Cholesterol: 74 mg/dL (ref 0–99)
NonHDL: 97.01
Total CHOL/HDL Ratio: 3
Triglycerides: 117 mg/dL (ref 0.0–149.0)
VLDL: 23.4 mg/dL (ref 0.0–40.0)

## 2021-04-30 LAB — CBC WITH DIFFERENTIAL/PLATELET
Basophils Absolute: 0 10*3/uL (ref 0.0–0.1)
Basophils Relative: 0.3 % (ref 0.0–3.0)
Eosinophils Absolute: 0 10*3/uL (ref 0.0–0.7)
Eosinophils Relative: 0.3 % (ref 0.0–5.0)
HCT: 40.7 % (ref 39.0–52.0)
Hemoglobin: 13.7 g/dL (ref 13.0–17.0)
Lymphocytes Relative: 22.5 % (ref 12.0–46.0)
Lymphs Abs: 1.7 10*3/uL (ref 0.7–4.0)
MCHC: 33.6 g/dL (ref 30.0–36.0)
MCV: 96.9 fl (ref 78.0–100.0)
Monocytes Absolute: 0.5 10*3/uL (ref 0.1–1.0)
Monocytes Relative: 7.5 % (ref 3.0–12.0)
Neutro Abs: 5.1 10*3/uL (ref 1.4–7.7)
Neutrophils Relative %: 69.4 % (ref 43.0–77.0)
Platelets: 205 10*3/uL (ref 150.0–400.0)
RBC: 4.2 Mil/uL — ABNORMAL LOW (ref 4.22–5.81)
RDW: 12.8 % (ref 11.5–15.5)
WBC: 7.4 10*3/uL (ref 4.0–10.5)

## 2021-04-30 LAB — HEPATIC FUNCTION PANEL
ALT: 29 U/L (ref 0–53)
AST: 37 U/L (ref 0–37)
Albumin: 4.1 g/dL (ref 3.5–5.2)
Alkaline Phosphatase: 68 U/L (ref 39–117)
Bilirubin, Direct: 0.1 mg/dL (ref 0.0–0.3)
Total Bilirubin: 0.8 mg/dL (ref 0.2–1.2)
Total Protein: 6.9 g/dL (ref 6.0–8.3)

## 2021-04-30 LAB — HEMOGLOBIN A1C: Hgb A1c MFr Bld: 5.9 % (ref 4.6–6.5)

## 2021-04-30 LAB — VITAMIN D 25 HYDROXY (VIT D DEFICIENCY, FRACTURES): VITD: 29.34 ng/mL — ABNORMAL LOW (ref 30.00–100.00)

## 2021-04-30 LAB — VITAMIN B12: Vitamin B-12: 557 pg/mL (ref 211–911)

## 2021-04-30 LAB — PSA: PSA: 2.79 ng/mL (ref 0.10–4.00)

## 2021-04-30 LAB — TSH: TSH: 0.57 u[IU]/mL (ref 0.35–5.50)

## 2021-04-30 NOTE — Patient Instructions (Signed)
Your EKG was done today  Please take all medications as prescribed, including the Pepcid at bedtime  Please continue all other medications as before, and refills have been done if requested.  Please have the pharmacy call with any other refills you may need.  Please continue your efforts at being more active, low cholesterol diet, and weight control.  You are otherwise up to date with prevention measures today.  Please keep your appointments with your specialists as you may have planned  You will be contacted regarding the referral for: Heart stress testing, CT scan for the abdominal and Gastroenterology  Please go to the XRAY Department in the first floor for the x-ray testing  Please go to the LAB at the blood drawing area for the tests to be done  You will be contacted by phone if any changes need to be made immediately.  Otherwise, you will receive a letter about your results with an explanation, but please check with MyChart first.  Please remember to sign up for MyChart if you have not done so, as this will be important to you in the future with finding out test results, communicating by private email, and scheduling acute appointments online when needed.  Please make an Appointment to return in 3 months, or sooner if needed

## 2021-04-30 NOTE — Progress Notes (Signed)
Patient ID: Jack [name], male   DOB: 09-Jun-1945, 76 y.o.   MRN: [id number]         Chief Complaint:: wellness exam and Anxiety  , aortic atherosclerosis, chest pain, htn, hyperglycemia, hld, epigastric pain, low vit d       HPI:  Jack [name] is a 76 y.o. male here for wellness exam; decliens covid booster, shingrix o/w up to date.                          Also c/o 2 wks onset epigatric /low sscp dull with some radiation to upper chest and bilateral posterior shoulders, with some sob but no radiation, n/v, diaphoresis, palps or dizziness.   Pain has woke him up several times at 3 am and caused quite a bit of anxiety and panic like symptoms per pt.  He is very concerned about possible heart disease given his age.  No current pain, only last < 30 min.   Did try an antacid once and helped for a short time.  Only takes the PPI a few times a week at the most, not clear why.   Pt denies polydipsia, polyuria, or new focal neuro s/s.   Pt denies fever, wt loss, night sweats, loss of appetite, or other constitutional symptoms  Not taking Vit d.     Wt Readings from Last 3 Encounters:  04/30/21 163 lb (73.9 kg)  04/10/21 160 lb (72.6 kg)  04/04/21 175 lb (79.4 kg)   BP Readings from Last 3 Encounters:  04/30/21 116/60  04/10/21 120/70  04/04/21 107/71   Immunization History  Administered Date(s) Administered   Fluad Quad(high Dose 65+) 03/29/2019, 01/26/2020, 01/24/2021   Influenza Whole 02/16/2007, 02/27/2008   Influenza, High Dose Seasonal PF 03/09/2016, 02/26/2018, 06/29/2018   Influenza,inj,Quad PF,6+ Mos 05/17/2014   PFIZER(Purple Top)SARS-COV-2 Vaccination 06/12/2019, 07/03/2019, 02/10/2020   Pneumococcal Conjugate-13 09/27/2013   Pneumococcal Polysaccharide-23 02/27/2008, 10/04/2014   Td 02/16/2007   Tdap 12/03/2016  There are no preventive care reminders to display for this patient.    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     BACK PAIN 02/16/2007   COPD (chronic obstructive pulmonary disease) ([place])    Diverticulosis 09/27/2013   FOOT PAIN, CHRONIC 02/27/2008   GERD (gastroesophageal reflux disease)    Hepatitis C antibody test positive    HERPES ZOSTER, HX OF 02/27/2008   HYPERLIPIDEMIA 02/16/2007   HYPERTENSION 02/16/2007   Personal history of colonic polyps 03/08/2007   PROSTATE SPECIFIC ANTIGEN, ELEVATED 02/16/2007   Shortness of breath dyspnea    Past Surgical History:  Procedure Laterality Date   COLONOSCOPY     FOOT SURGERY     reconstruction x's 4 after work accident    reports that he quit smoking about 37 years ago. His smoking use included cigarettes. He has a 7.50 pack-year smoking history. He has never used smokeless tobacco. He reports that he does not drink alcohol and does not use drugs. family history includes Alcohol abuse in his brother; Cancer in his mother and sister; Coronary artery disease in his father; Hypertension in his father. No Known Allergies Current Outpatient Medications on File Prior to Visit  Medication Sig Dispense Refill   albuterol (VENTOLIN HFA) 108 (90 Base) MCG/ACT inhaler INHALE 1-2 PUFFS INTO THE LUNGS EVERY 6 (SIX) HOURS AS NEEDED FOR WHEEZING OR SHORTNESS OF BREATH. 25.5 each 11  atenolol (TENORMIN) 25 MG tablet Take 1 tablet (25 mg total) by mouth daily. 90 tablet 3   cetirizine (ZYRTEC) 10 MG tablet TAKE 1 TABLET BY MOUTH EVERY DAY 90 tablet 3   citalopram (CELEXA) 10 MG tablet TAKE 1 TABLET BY MOUTH EVERY DAY 90 tablet 3   famotidine (PEPCID) 20 MG tablet TAKE 1 TABLET BY MOUTH EVERYDAY AT BEDTIME 90 tablet 3   finasteride (PROSCAR) 5 MG tablet Take 1 tablet (5 mg total) by mouth daily. 90 tablet 3   lactulose (CHRONULAC) 10 GM/15ML solution Take 45 mLs (30 g total) by mouth daily as needed for mild constipation. 236 mL 1   levocetirizine (XYZAL) 5 MG tablet Take 1 tablet (5 mg total) by mouth daily as needed for allergies. 90 tablet 1    losartan-hydrochlorothiazide (HYZAAR) 50-12.5 MG tablet TAKE 1 TABLET BY MOUTH EVERY DAY 90 tablet 3   methimazole (TAPAZOLE) 5 MG tablet TAKE 1 TABLET BY MOUTH THREE TIMES A WEEK. 12 tablet 0   pantoprazole (PROTONIX) 40 MG tablet Take 1 tablet (40 mg total) by mouth 2 (two) times daily. 180 tablet 3   polyethylene glycol powder (GLYCOLAX/MIRALAX) 17 GM/SCOOP powder Take 17 g by mouth daily. 3350 g 1   potassium chloride (KLOR-CON) 8 MEQ tablet Take 1 tablet (8 mEq total) by mouth daily. 90 tablet 3   predniSONE (DELTASONE) 10 MG tablet 3 tabs by mouth per day for 3 days,2tabs per day for 3 days,1tab per day for 3 days 18 tablet 0   rosuvastatin (CRESTOR) 40 MG tablet TAKE 1 TABLET BY MOUTH EVERY DAY 90 tablet 3   simvastatin (ZOCOR) 40 MG tablet Take 1 tablet (40 mg total) by mouth daily. 90 tablet 1   sucralfate (CARAFATE) 1 GM/10ML suspension Take 10 mLs (1 g total) by mouth 4 (four) times daily -  with meals and at bedtime. 420 mL 0   tiZANidine (ZANAFLEX) 4 MG tablet Take 1 tablet (4 mg total) by mouth every 6 (six) hours as needed for muscle spasms. 40 tablet 1   traMADol (ULTRAM) 50 MG tablet TAKE 1 TABLET BY MOUTH EVERY 6 HOURS AS NEEDED. 60 tablet 1   triamcinolone (NASACORT) 55 MCG/ACT AERO nasal inhaler Place 2 sprays into the nose daily. 3 Inhaler 3   clonazePAM (KLONOPIN) 0.5 MG tablet TAKE 1 TABLET BY MOUTH TWICE A DAY AS NEEDED FOR ANXIETY (Patient not taking: Reported on 04/30/2021) 60 tablet 2   No current facility-administered medications on file prior to visit.        ROS:  All others reviewed and negative.  Objective        PE:  BP 116/60 (BP Location: Right Arm, Patient Position: Sitting, Cuff Size: Normal)    Pulse (!) 54    Temp 98.6 F (37 C) (Oral)    Ht 5\' 6"  (1.676 m)    Wt 163 lb (73.9 kg)    SpO2 99%    BMI 26.31 kg/m                 Constitutional: Pt appears in NAD               HENT: Head: NCAT.                Right Ear: External ear normal.                  Left Ear: External ear normal.  Eyes: . Pupils are equal, round, and reactive to light. Conjunctivae and EOM are normal               Nose: without d/c or deformity               Neck: Neck supple. Gross normal ROM               Cardiovascular: Normal rate and regular rhythm.                 Pulmonary/Chest: Effort normal and breath sounds without rales or wheezing.                Abd:  Soft, NT, ND, + BS, no organomegaly               Neurological: Pt is alert. At baseline orientation, motor grossly intact               Skin: Skin is warm. No rashes, no other new lesions, LE edema - none               Psychiatric: Pt behavior is normal without agitation   Micro: none  Cardiac tracings I have personally interpreted today:  ECG - NSR  Pertinent Radiological findings (summarize): none   Lab Results  Component Value Date   WBC 7.4 04/30/2021   HGB 13.7 04/30/2021   HCT 40.7 04/30/2021   PLT 205.0 04/30/2021   GLUCOSE 100 (H) 04/30/2021   CHOL 160 04/30/2021   TRIG 117.0 04/30/2021   HDL 63.00 04/30/2021   LDLDIRECT 89.0 12/06/2017   LDLCALC 74 04/30/2021   ALT 29 04/30/2021   AST 37 04/30/2021   NA 140 04/30/2021   K 4.1 04/30/2021   CL 104 04/30/2021   CREATININE 1.28 04/30/2021   BUN 19 04/30/2021   CO2 28 04/30/2021   TSH 0.57 04/30/2021   PSA 2.79 04/30/2021   HGBA1C 5.9 04/30/2021   Assessment/Plan:  CAIUS SILBERNAGEL is a 76 y.o. Black or African American [2] male with  has a past medical history of ALLERGIC RHINITIS (02/16/2007), Arthritis (2013), BACK PAIN (02/16/2007), COPD (chronic obstructive pulmonary disease) ([place]), Diverticulosis (09/27/2013), FOOT PAIN, CHRONIC (02/27/2008), GERD (gastroesophageal reflux disease), Hepatitis C antibody test positive, HERPES ZOSTER, HX OF (02/27/2008), HYPERLIPIDEMIA (02/16/2007), HYPERTENSION (02/16/2007), Personal history of colonic polyps (03/08/2007), PROSTATE SPECIFIC ANTIGEN, ELEVATED (02/16/2007), and Shortness of  breath dyspnea.  Encounter for well adult exam with abnormal findings Age and sex appropriate education and counseling updated with regular exercise and diet Referrals for preventative services - none needed Immunizations addressed - declines covid booster, shingrix Smoking counseling  - none needed Evidence for depression or other mood disorder - mild anxiety situational Most recent labs reviewed. I have personally reviewed and have noted: 1) the patient's medical and social history 2) The patient's current medications and supplements 3) The patient's height, weight, and BMI have been recorded in the chart   Aortic atherosclerosis (HCC) Pt to continue the crestor, low chol diet, excercise  Chest pain ecg reviewed, also for cxr and cardiac stress test as cant r/o cardiac  Essential hypertension BP Readings from Last 3 Encounters:  04/30/21 116/60  04/10/21 120/70  04/04/21 107/71   Stable, pt to continue medical treatment tenormin, hyzaar   Hyperglycemia Lab Results  Component Value Date   HGBA1C 5.9 04/30/2021   Stable, pt to continue current medical treatment  - diet   Hyperlipidemia Lab Results  Component Value Date   [facility]  74 04/30/2021   Mild uncontrolled, goal ldl < 70, pt to continue current statin zocor 40 as declines change   Upper abdominal pain Etiology unclear - ?gastritis vs reflux  - pt encouraged for compliance with PPI daily, add pepcid qhs, consider ct abd, refer GI  Vitamin D deficiency Last vitamin D Lab Results  Component Value Date   VD25OH 29.34 (L) 04/30/2021   Low, to start oral replacement  Followup: Return in about 3 months (around 07/29/2021).  Cathlean Cower, MD 05/04/2021 9:01 PM [facility]

## 2021-04-30 NOTE — Telephone Encounter (Signed)
Sorry, I am no longer able to keep refilling this  Further refills should come per Endocrinology, thanks

## 2021-05-04 ENCOUNTER — Encounter: Payer: Self-pay | Admitting: Internal Medicine

## 2021-05-04 NOTE — Assessment & Plan Note (Signed)
Pt to continue the crestor, low chol diet, excercise

## 2021-05-04 NOTE — Assessment & Plan Note (Signed)
BP Readings from Last 3 Encounters:  04/30/21 116/60  04/10/21 120/70  04/04/21 107/71   Stable, pt to continue medical treatment tenormin, hyzaar

## 2021-05-04 NOTE — Assessment & Plan Note (Signed)
Etiology unclear - ?gastritis vs reflux  - pt encouraged for compliance with PPI daily, add pepcid qhs, consider ct abd, refer GI

## 2021-05-04 NOTE — Assessment & Plan Note (Signed)
Last vitamin D Lab Results  Component Value Date   VD25OH 29.34 (L) 04/30/2021   Low, to start oral replacement

## 2021-05-04 NOTE — Assessment & Plan Note (Signed)
Age and sex appropriate education and counseling updated with regular exercise and diet Referrals for preventative services - none needed Immunizations addressed - declines covid booster, shingrix Smoking counseling  - none needed Evidence for depression or other mood disorder - mild anxiety situational Most recent labs reviewed. I have personally reviewed and have noted: 1) the patient's medical and social history 2) The patient's current medications and supplements 3) The patient's height, weight, and BMI have been recorded in the chart

## 2021-05-04 NOTE — Assessment & Plan Note (Signed)
Lab Results  Component Value Date   LDLCALC 74 04/30/2021   Mild uncontrolled, goal ldl < 70, pt to continue current statin zocor 40 as declines change

## 2021-05-04 NOTE — Assessment & Plan Note (Signed)
Lab Results  Component Value Date   HGBA1C 5.9 04/30/2021   Stable, pt to continue current medical treatment  - diet

## 2021-05-04 NOTE — Assessment & Plan Note (Signed)
ecg reviewed, also for cxr and cardiac stress test as cant r/o cardiac

## 2021-05-05 NOTE — Progress Notes (Signed)
° °  I, Wendy Poet, LAT, ATC, am serving as scribe for Dr. Lynne Leader.  Jack [name] is a 76 y.o. male who presents to [facility] today for f/u of R shoulder pain thought to be due to subacromial bursitis and impingement.  He was last seen by Dr. Georgina Snell on 03/25/21 and had a R subacromial injection.  He was also referred to PT at [facility] but has not completed any sessions due to his wife being sick  Today, pt reports that his R shoulder is doing better.  His main pain location at this point is in his L ant elbow mainly at night.  Diagnostic imaging: R shoulder XR- 03/25/21  Pertinent review of systems: No fevers or chills  Relevant historical information: Hypertension   Exam:  BP 100/70 (BP Location: Right Arm, Patient Position: Sitting, Cuff Size: Normal)    Pulse 73    Ht 5\' 6"  (1.676 m)    Wt 166 lb 3.2 oz (75.4 kg)    SpO2 96%    BMI 26.83 kg/m  General: Well Developed, well nourished, and in no acute distress.   MSK: Right shoulder: Normal-appearing Normal motion. Intact strength.  Negative impingement testing.     Assessment and Plan: 76 y.o. male with right shoulder pain improved with injection and some home exercise program.  He never was able to go to physical therapy due to his wife's needs.  Watchful waiting for now recheck as needed.   Discussed warning signs or symptoms. Please see discharge instructions. Patient expresses understanding.   The above documentation has been reviewed and is accurate and complete Lynne Leader, M.D.

## 2021-05-06 ENCOUNTER — Ambulatory Visit (INDEPENDENT_AMBULATORY_CARE_PROVIDER_SITE_OTHER): Payer: Medicare Other | Admitting: Family Medicine

## 2021-05-06 ENCOUNTER — Other Ambulatory Visit: Payer: Self-pay

## 2021-05-06 ENCOUNTER — Encounter: Payer: Self-pay | Admitting: Family Medicine

## 2021-05-06 VITALS — BP 100/70 | HR 73 | Ht 66.0 in | Wt 166.2 lb

## 2021-05-06 DIAGNOSIS — M7551 Bursitis of right shoulder: Secondary | ICD-10-CM

## 2021-05-06 NOTE — Patient Instructions (Signed)
Good to see you  Follow up as needed  

## 2021-05-12 ENCOUNTER — Telehealth (HOSPITAL_COMMUNITY): Payer: Self-pay

## 2021-05-12 NOTE — Telephone Encounter (Signed)
Spoke with the patient, detailed instructions given. He stated that he understood and would be here for his test. Asked to call back with any questions. S.Shamika Pedregon EMTP 

## 2021-05-13 ENCOUNTER — Ambulatory Visit (HOSPITAL_COMMUNITY): Payer: Medicare Other | Attending: Internal Medicine

## 2021-05-13 ENCOUNTER — Encounter: Payer: Self-pay | Admitting: Internal Medicine

## 2021-05-13 ENCOUNTER — Other Ambulatory Visit: Payer: Self-pay

## 2021-05-13 DIAGNOSIS — R079 Chest pain, unspecified: Secondary | ICD-10-CM | POA: Diagnosis not present

## 2021-05-13 LAB — MYOCARDIAL PERFUSION IMAGING
Base ST Depression (mm): 0 mm
LV dias vol: 81 mL (ref 62–150)
LV sys vol: 23 mL
Nuc Stress EF: 71 %
Peak HR: 89 {beats}/min
Rest HR: 62 {beats}/min
Rest Nuclear Isotope Dose: 10.5 mCi
SDS: 0
SRS: 0
SSS: 0
ST Depression (mm): 0 mm
Stress Nuclear Isotope Dose: 29.8 mCi
TID: 0.96

## 2021-05-13 MED ORDER — TECHNETIUM TC 99M TETROFOSMIN IV KIT
10.5000 | PACK | Freq: Once | INTRAVENOUS | Status: AC | PRN
  Administered 2021-05-13: 10.5 via INTRAVENOUS
  Filled 2021-05-13: qty 11

## 2021-05-13 MED ORDER — REGADENOSON 0.4 MG/5ML IV SOLN
0.4000 mg | Freq: Once | INTRAVENOUS | Status: AC
  Administered 2021-05-13: 0.4 mg via INTRAVENOUS

## 2021-05-13 MED ORDER — TECHNETIUM TC 99M TETROFOSMIN IV KIT
29.8000 | PACK | Freq: Once | INTRAVENOUS | Status: AC | PRN
  Administered 2021-05-13: 29.8 via INTRAVENOUS
  Filled 2021-05-13: qty 30

## 2021-05-19 NOTE — Addendum Note (Signed)
Addended by: Terence Lux A on: 05/19/2021 02:04 PM   Modules accepted: Orders

## 2021-05-23 ENCOUNTER — Ambulatory Visit
Admission: RE | Admit: 2021-05-23 | Discharge: 2021-05-23 | Disposition: A | Discharge status: Home / Self Care | Payer: Medicare Other | Source: Ambulatory Visit | Attending: Internal Medicine | Admitting: Internal Medicine

## 2021-05-23 ENCOUNTER — Other Ambulatory Visit: Payer: Self-pay

## 2021-05-23 DIAGNOSIS — I700 Atherosclerosis of aorta: Secondary | ICD-10-CM | POA: Diagnosis not present

## 2021-05-23 DIAGNOSIS — K5730 Diverticulosis of large intestine without perforation or abscess without bleeding: Secondary | ICD-10-CM | POA: Diagnosis not present

## 2021-05-23 DIAGNOSIS — R1010 Upper abdominal pain, unspecified: Secondary | ICD-10-CM

## 2021-05-23 DIAGNOSIS — M47816 Spondylosis without myelopathy or radiculopathy, lumbar region: Secondary | ICD-10-CM | POA: Diagnosis not present

## 2021-05-23 MED ORDER — IOPAMIDOL (ISOVUE-300) INJECTION 61%
100.0000 mL | Freq: Once | INTRAVENOUS | Status: AC | PRN
  Administered 2021-05-23: 100 mL via INTRAVENOUS

## 2021-05-25 ENCOUNTER — Encounter: Payer: Self-pay | Admitting: Internal Medicine

## 2021-05-26 ENCOUNTER — Emergency Department (HOSPITAL_BASED_OUTPATIENT_CLINIC_OR_DEPARTMENT_OTHER)
Admission: EM | Admit: 2021-05-26 | Discharge: 2021-05-26 | Disposition: A | Discharge status: Home / Self Care | Payer: Medicare Other | Attending: Emergency Medicine | Admitting: Emergency Medicine

## 2021-05-26 ENCOUNTER — Other Ambulatory Visit: Payer: Self-pay

## 2021-05-26 ENCOUNTER — Encounter (HOSPITAL_BASED_OUTPATIENT_CLINIC_OR_DEPARTMENT_OTHER): Payer: Self-pay | Admitting: Urology

## 2021-05-26 ENCOUNTER — Ambulatory Visit: Payer: Self-pay | Admitting: *Deleted

## 2021-05-26 DIAGNOSIS — R600 Localized edema: Secondary | ICD-10-CM | POA: Insufficient documentation

## 2021-05-26 DIAGNOSIS — M7989 Other specified soft tissue disorders: Secondary | ICD-10-CM | POA: Diagnosis present

## 2021-05-26 DIAGNOSIS — M70869 Other soft tissue disorders related to use, overuse and pressure, unspecified leg: Secondary | ICD-10-CM | POA: Diagnosis not present

## 2021-05-26 LAB — CBC WITH DIFFERENTIAL/PLATELET
Abs Immature Granulocytes: 0.03 10*3/uL (ref 0.00–0.07)
Basophils Absolute: 0 10*3/uL (ref 0.0–0.1)
Basophils Relative: 0 %
Eosinophils Absolute: 0.1 10*3/uL (ref 0.0–0.5)
Eosinophils Relative: 1 %
HCT: 39.1 % (ref 39.0–52.0)
Hemoglobin: 13 g/dL (ref 13.0–17.0)
Immature Granulocytes: 0 %
Lymphocytes Relative: 32 %
Lymphs Abs: 3.4 10*3/uL (ref 0.7–4.0)
MCH: 32.3 pg (ref 26.0–34.0)
MCHC: 33.2 g/dL (ref 30.0–36.0)
MCV: 97 fL (ref 80.0–100.0)
Monocytes Absolute: 0.7 10*3/uL (ref 0.1–1.0)
Monocytes Relative: 6 %
Neutro Abs: 6.3 10*3/uL (ref 1.7–7.7)
Neutrophils Relative %: 61 %
Platelets: 172 10*3/uL (ref 150–400)
RBC: 4.03 MIL/uL — ABNORMAL LOW (ref 4.22–5.81)
RDW: 12.4 % (ref 11.5–15.5)
WBC: 10.6 10*3/uL — ABNORMAL HIGH (ref 4.0–10.5)
nRBC: 0 % (ref 0.0–0.2)

## 2021-05-26 LAB — COMPREHENSIVE METABOLIC PANEL
ALT: 10 U/L (ref 0–44)
AST: 18 U/L (ref 15–41)
Albumin: 3.9 g/dL (ref 3.5–5.0)
Alkaline Phosphatase: 68 U/L (ref 38–126)
Anion gap: 9 (ref 5–15)
BUN: 12 mg/dL (ref 8–23)
CO2: 26 mmol/L (ref 22–32)
Calcium: 9.4 mg/dL (ref 8.9–10.3)
Chloride: 105 mmol/L (ref 98–111)
Creatinine, Ser: 1.16 mg/dL (ref 0.61–1.24)
GFR, Estimated: >60 mL/min (ref >60)
Glucose, Bld: 152 mg/dL — ABNORMAL HIGH (ref 70–99)
Potassium: 3.3 mmol/L — ABNORMAL LOW (ref 3.5–5.1)
Sodium: 140 mmol/L (ref 135–145)
Total Bilirubin: 0.4 mg/dL (ref 0.3–1.2)
Total Protein: 6.9 g/dL (ref 6.5–8.1)

## 2021-05-26 LAB — BRAIN NATRIURETIC PEPTIDE: B Natriuretic Peptide: 46.6 pg/mL (ref 0.0–100.0)

## 2021-05-26 MED ORDER — PREDNISONE 50 MG PO TABS
60.0000 mg | ORAL_TABLET | Freq: Once | ORAL | Status: AC
  Administered 2021-05-26: 60 mg via ORAL
  Filled 2021-05-26: qty 1

## 2021-05-26 MED ORDER — PREDNISONE 20 MG PO TABS: 40.0000 mg | ORAL_TABLET | Freq: Every day | ORAL | 0 refills | Status: DC

## 2021-05-26 MED ORDER — DIPHENHYDRAMINE HCL 25 MG PO CAPS
25.0000 mg | ORAL_CAPSULE | Freq: Once | ORAL | Status: AC
  Administered 2021-05-26: 25 mg via ORAL
  Filled 2021-05-26: qty 1

## 2021-05-26 MED ORDER — DIPHENHYDRAMINE HCL 25 MG PO TABS: 25.0000 mg | ORAL_TABLET | Freq: Four times a day (QID) | ORAL | 0 refills | Status: AC | PRN

## 2021-05-26 NOTE — ED Triage Notes (Signed)
Bilateral lower extremity swelling  +2 pitting edema Had MRI Friday prior to the swelling started Mild pain in legs  NAD at this time A&O x 4

## 2021-05-26 NOTE — ED Provider Notes (Signed)
[facility] EMERGENCY DEPT Provider Note   CSN: [id number] Arrival date & time: 05/26/21  1836     History  Chief Complaint  Patient presents with   Leg Swelling    Jack [name] is a 76 y.o. male.  Patient presents to the emergency department tonight for evaluation of bilateral lower extremity swelling, redness and warmth.  Patient reports having a CT scan of his abdomen with contrast performed 3 days ago.  After this test, he developed gradual onset of lower extremity swelling bilaterally with redness and warmth.  He had more significant swelling and pain starting yesterday.  States that he spoke with his doctor and was encouraged to come to the emergency department for evaluation.  He denies associated facial swelling, lip or tongue swelling, generalized rash, vomiting or diarrhea.  No fevers or chills.  He denies history of heart failure, liver problems or kidney problems.  No treatments prior to arrival other than applying cool compresses.      Home Medications Prior to Admission medications   Medication Sig Start Date End Date Taking? Authorizing Provider  diphenhydrAMINE (BENADRYL) 25 MG tablet Take 1 tablet (25 mg total) by mouth every 6 (six) hours as needed. 05/26/21  Yes Carlisle Cater, PA-C  predniSONE (DELTASONE) 20 MG tablet Take 2 tablets (40 mg total) by mouth daily. 05/26/21  Yes Carlisle Cater, PA-C  albuterol (VENTOLIN HFA) 108 (90 Base) MCG/ACT inhaler INHALE 1-2 PUFFS INTO THE LUNGS EVERY 6 (SIX) HOURS AS NEEDED FOR WHEEZING OR SHORTNESS OF BREATH. 07/11/20   Biagio Borg, MD  atenolol (TENORMIN) 25 MG tablet Take 1 tablet (25 mg total) by mouth daily. 12/06/17   Biagio Borg, MD  cetirizine (ZYRTEC) 10 MG tablet TAKE 1 TABLET BY MOUTH EVERY DAY 11/28/19   Biagio Borg, MD  citalopram (CELEXA) 10 MG tablet TAKE 1 TABLET BY MOUTH EVERY DAY 08/31/20   Biagio Borg, MD  clonazePAM (KLONOPIN) 0.5 MG tablet TAKE 1 TABLET BY MOUTH TWICE A DAY AS NEEDED FOR  ANXIETY Patient not taking: Reported on 04/30/2021 07/06/17   Biagio Borg, MD  famotidine (PEPCID) 20 MG tablet TAKE 1 TABLET BY MOUTH EVERYDAY AT BEDTIME 08/31/20   Biagio Borg, MD  finasteride (PROSCAR) 5 MG tablet Take 1 tablet (5 mg total) by mouth daily. 12/06/17   Biagio Borg, MD  lactulose (CHRONULAC) 10 GM/15ML solution Take 45 mLs (30 g total) by mouth daily as needed for mild constipation. 08/18/19   Biagio Borg, MD  levocetirizine (XYZAL) 5 MG tablet Take 1 tablet (5 mg total) by mouth daily as needed for allergies. 06/01/16   Biagio Borg, MD  losartan-hydrochlorothiazide [facility]) 50-12.5 MG tablet TAKE 1 TABLET BY MOUTH EVERY DAY 08/31/20   Biagio Borg, MD  methimazole (TAPAZOLE) 5 MG tablet TAKE 1 TABLET BY MOUTH THREE TIMES A WEEK. 04/08/21   Biagio Borg, MD  pantoprazole (PROTONIX) 40 MG tablet Take 1 tablet (40 mg total) by mouth 2 (two) times daily. 04/10/21   Biagio Borg, MD  polyethylene glycol powder [facility]) 17 GM/SCOOP powder Take 17 g by mouth daily. 08/18/19   Biagio Borg, MD  potassium chloride (KLOR-CON) 8 MEQ tablet Take 1 tablet (8 mEq total) by mouth daily. 01/24/21   Biagio Borg, MD  rosuvastatin (CRESTOR) 40 MG tablet TAKE 1 TABLET BY MOUTH EVERY DAY 10/10/20   Biagio Borg, MD  simvastatin (ZOCOR) 40 MG tablet Take 1 tablet (  40 mg total) by mouth daily. 09/24/20   Biagio Borg, MD  sucralfate (CARAFATE) 1 GM/10ML suspension Take 10 mLs (1 g total) by mouth 4 (four) times daily -  with meals and at bedtime. 01/18/21   Couture, Cortni S, PA-C  tiZANidine (ZANAFLEX) 4 MG tablet Take 1 tablet (4 mg total) by mouth every 6 (six) hours as needed for muscle spasms. 04/10/21   Biagio Borg, MD  traMADol (ULTRAM) 50 MG tablet TAKE 1 TABLET BY MOUTH EVERY 6 HOURS AS NEEDED. 04/28/21   Biagio Borg, MD  triamcinolone (NASACORT) 55 MCG/ACT AERO nasal inhaler Place 2 sprays into the nose daily. 10/24/19   Biagio Borg, MD      Allergies    Patient has no known  allergies.    Review of Systems   Review of Systems  Physical Exam Updated Vital Signs BP (!) 162/81    Pulse (!) 55    Temp 98.2 F (36.8 C) (Temporal)    Resp 16    Ht 5\' 6"  (1.676 m)    Wt 75.3 kg    SpO2 99%    BMI 26.79 kg/m  Physical Exam Vitals and nursing note reviewed.  Constitutional:      General: He is not in acute distress.    Appearance: He is well-developed.  HENT:     Head: Normocephalic and atraumatic.     Mouth/Throat:     Mouth: Mucous membranes are moist.     Comments: No signs of angioedema or pharyngeal swelling. Eyes:     General:        Right eye: No discharge.        Left eye: No discharge.     Conjunctiva/sclera: Conjunctivae normal.     Comments: Normal conjunctiva.  Cardiovascular:     Rate and Rhythm: Normal rate and regular rhythm.     Heart sounds: Normal heart sounds.  Pulmonary:     Effort: Pulmonary effort is normal.     Breath sounds: Normal breath sounds.  Abdominal:     Palpations: Abdomen is soft.     Tenderness: There is no abdominal tenderness.  Musculoskeletal:     Cervical back: Normal range of motion and neck supple.     Right lower leg: Edema present.     Left lower leg: Edema present.     Comments: 1+ pitting edema noted to both lower extremities.  Skin:    General: Skin is warm and dry.     Comments: Patient has poorly demarcated erythema with mild warmth of the lower extremities extending from the ankles to the thighs.  It almost appears similar to a sunburn.  No desquamation.    Neurological:     Mental Status: He is alert.    ED Results / Procedures / Treatments   Labs (all labs ordered are listed, but only abnormal results are displayed) Labs Reviewed  CBC WITH DIFFERENTIAL/PLATELET - Abnormal; Notable for the following components:      Result Value   WBC 10.6 (*)    RBC 4.03 (*)    All other components within normal limits  COMPREHENSIVE METABOLIC PANEL - Abnormal; Notable for the following components:    Potassium 3.3 (*)    Glucose, Bld 152 (*)    All other components within normal limits  BRAIN NATRIURETIC PEPTIDE    EKG EKG Interpretation  Date/Time:  Monday May 26 2021 18:53:46 EST Ventricular Rate:  75 PR Interval:  164 QRS Duration:  98 QT Interval:  394 QTC Calculation: 439 R Axis:   68 Text Interpretation: Normal sinus rhythm with sinus arrhythmia Anterior infarct (cited on or before 13-May-2021) Abnormal ECG When compared with ECG of 13-May-2021 08:47, No significant change was found Confirmed by Regan Lemming ([id number]) on 05/26/2021 7:00:51 PM  Radiology No results found.  Procedures Procedures    Medications Ordered in ED Medications  predniSONE (DELTASONE) tablet 60 mg (60 mg Oral Given 05/26/21 2031)  diphenhydrAMINE (BENADRYL) capsule 25 mg (25 mg Oral Given 05/26/21 2031)    ED Course/ Medical Decision Making/ A&P    Patient seen and examined. History obtained directly from patient. Work-up including labs, imaging, EKG ordered in triage, if performed, were reviewed.    Labs/EKG: Independently reviewed and interpreted.  This included: CBC with minimal leukocytosis, normal BNP, CMP with mild hypokalemia and hyperglycemia.  Medications/Fluids: Ordered: Prednisone and Benadryl   Most recent vital signs reviewed and are as follows: BP (!) 162/81    Pulse (!) 55    Temp 98.2 F (36.8 C) (Temporal)    Resp 16    Ht 5\' 6"  (1.676 m)    Wt 75.3 kg    SpO2 99%    BMI 26.79 kg/m   Initial impression: Mild delayed hypersensitivity reaction.  Low concern for bilateral lower extremity cellulitis, bilateral DVT.  No history or evidence of renal dysfunction, liver dysfunction, or heart failure.  Patient was discussed with Dr. Armandina Gemma.  He agrees with plan to treat with steroids and antihistamines.  Patient and wife updated on plan.  They are in agreement.  Given unclear etiology, would prefer if he followed up with his doctor in the next 48 hours for recheck to ensure no  progression of symptoms.  Pt urged to return with worsening pain, worsening swelling, expanding area of redness or streaking up extremity, fever, or any other concerns. Pt verbalizes understanding and agrees with plan.                          Medical Decision Making Amount and/or Complexity of Data Reviewed Labs: ordered.  Risk OTC drugs. Prescription drug management.   Patient with bilateral lower extremity edema and erythema and warmth.  No systemic symptoms of illness.  No signs of anaphylaxis.  Question delayed hypersensitivity reaction from IV contrast?  No signs of Miloh Alcocer syndrome or toxic epidermal necrolysis today.  He looks well, nontoxic.  We will treat as allergic reaction and have him follow-up.  Return instructions as above.          Final Clinical Impression(s) / ED Diagnoses Final diagnoses:  Swelling of lower extremity    Rx / DC Orders ED Discharge Orders          Ordered    predniSONE (DELTASONE) 20 MG tablet  Daily        05/26/21 2104    diphenhydrAMINE (BENADRYL) 25 MG tablet  Every 6 hours PRN        05/26/21 2104              Carlisle Cater, PA-C 05/26/21 2111    Regan Lemming, MD 05/26/21 2114

## 2021-05-26 NOTE — Telephone Encounter (Signed)
Pt's niece reports pt with leg swelling since Friday, S/P MRI.  Pt sees Dr. Jenny Reichmann. Advised to call PCP and number provided. Advised MD with practice will be on call. Verbalizes understanding. Reason for Disposition  Non-urgent call redirected to PCP's office because it is open    Advised to call PCP, provider for practice  would be on call.  Protocols used: No Contact or Duplicate Contact Call-A-AH

## 2021-05-26 NOTE — Discharge Instructions (Signed)
Please read and follow all provided instructions.  Your diagnoses today include:  1. Swelling of lower extremity     Tests performed today include: Vital signs. See below for your results today.   Medications prescribed:  Prednisone - steroid medicine   It is best to take this medication in the morning to prevent sleeping problems. If you are diabetic, monitor your blood sugar closely and stop taking Prednisone if blood sugar is over 300. Take with food to prevent stomach upset.   Benadryl (diphenhydramine) - antihistamine  You can find this medication over-the-counter.   DO NOT exceed:  50mg  Benadryl every 6 hours    Benadryl will make you drowsy. DO NOT drive or perform any activities that require you to be awake and alert if taking this.  Take any prescribed medications only as directed.  Home care instructions:  Follow any educational materials contained in this packet  Follow-up instructions: Please follow-up with your primary care provider in the next 2 days for further evaluation of your symptoms.   Return instructions:  Please return to the Emergency Department if you experience worsening symptoms.  Call 9-1-1 immediately if you have an allergic reaction that involves your lips, mouth, throat or if you have any difficulty breathing. This is a life-threatening emergency.  Please return if you have any other emergent concerns.  Additional Information:  Your vital signs today were: BP (!) 162/81    Pulse (!) 55    Temp 98.2 F (36.8 C) (Temporal)    Resp 18    Ht 5\' 6"  (1.676 m)    Wt 75.3 kg    SpO2 99%    BMI 26.79 kg/m  If your blood pressure (BP) was elevated above 135/85 this visit, please have this repeated by your doctor within one month. --------------

## 2021-05-27 ENCOUNTER — Telehealth: Payer: Self-pay | Admitting: Internal Medicine

## 2021-05-27 NOTE — Telephone Encounter (Signed)
Pt wife called back and I gave her the first available appt 05/30/21

## 2021-05-27 NOTE — Telephone Encounter (Signed)
Unfortunately I dont much I can do about scheduling, sorry

## 2021-05-27 NOTE — Telephone Encounter (Signed)
Connected to [facility] 1.30.2023.  Caller states the pt had an MRI with contrast done Friday. His legs have swelled and have a red discoloring. Denied fever.   Advised to see hcp WITHIN 4 HOURS.

## 2021-05-27 NOTE — Telephone Encounter (Signed)
Per pt's ed avs, pt needs to f/u w/ provider by 2-1, provider does not have availability until 2-3  Please advise

## 2021-05-27 NOTE — Telephone Encounter (Signed)
Pt's wife checking status of appt  Please advise

## 2021-05-30 ENCOUNTER — Ambulatory Visit: Payer: Medicare Other | Admitting: Internal Medicine

## 2021-06-03 ENCOUNTER — Ambulatory Visit: Payer: Medicare Other | Admitting: Internal Medicine

## 2021-06-11 ENCOUNTER — Ambulatory Visit (INDEPENDENT_AMBULATORY_CARE_PROVIDER_SITE_OTHER): Payer: Medicare Other | Admitting: Endocrinology

## 2021-06-11 ENCOUNTER — Other Ambulatory Visit: Payer: Self-pay

## 2021-06-11 VITALS — BP 170/86 | HR 93 | Ht 66.0 in | Wt 167.4 lb

## 2021-06-11 DIAGNOSIS — E0590 Thyrotoxicosis, unspecified without thyrotoxic crisis or storm: Secondary | ICD-10-CM | POA: Diagnosis not present

## 2021-06-11 MED ORDER — METHIMAZOLE 5 MG PO TABS: 5.0000 mg | ORAL_TABLET | ORAL | 3 refills | Status: DC

## 2021-06-11 NOTE — Progress Notes (Signed)
Subjective:    Patient ID: Jack [name], male    DOB: [birth year], 76 y.o.   MRN: [id number]  HPI Pt returns for f/u of hyperthyroidism (dx'ed 2009; he was not on therapy for this until 2017; he has never had dedicated thyroid imaging, but chest CT in 2016 made no mention of the thyroid; trial of tapazole was chosen, due to tremor).  He takes tapazole 3 times per week, as rx'ed.  Tremor and palpitations are mild.   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    BACK PAIN 02/16/2007   COPD (chronic obstructive pulmonary disease) ([place])    Diverticulosis 09/27/2013   FOOT PAIN, CHRONIC 02/27/2008   GERD (gastroesophageal reflux disease)    Hepatitis C antibody test positive    HERPES ZOSTER, HX OF 02/27/2008   HYPERLIPIDEMIA 02/16/2007   HYPERTENSION 02/16/2007   Personal history of colonic polyps 03/08/2007   PROSTATE SPECIFIC ANTIGEN, ELEVATED 02/16/2007   Shortness of breath dyspnea     Past Surgical History:  Procedure Laterality Date   COLONOSCOPY     FOOT SURGERY     reconstruction x's 4 after work accident    Social History   Socioeconomic History   Marital status: Married    Spouse name: Not on file   Number of children: 3   Years of education: Not on file   Highest education level: Not on file  Occupational History   Not on file  Tobacco Use   Smoking status: Former    Packs/day: 0.25    Years: 30.00    Pack years: 7.50    Types: Cigarettes    Quit date: 04/27/1984    Years since quitting: 37.1   Smokeless tobacco: Never  Vaping Use   Vaping Use: Never used  Substance and Sexual Activity   Alcohol use: No    Alcohol/week: 0.0 standard drinks    Comment: no   Drug use: No   Sexual activity: Yes    Partners: Female  Other Topics Concern   Not on file  Social History Narrative   11 grade education   Married 1968   2 son's='69, '70; 1 dtr '80; 5 grandsons   Retired on disability from foot injury. Had  worked as a Ecologist   Marriage in good health-wife smokes 3 pks day   Social Determinants of Sales executive: Not on file  Food Insecurity: Not on file  Transportation Needs: Not on file  Physical Activity: Not on file  Stress: Not on file  Social Connections: Not on file  Intimate Partner Violence: Not on file    Current Outpatient Medications on File Prior to Visit  Medication Sig Dispense Refill   albuterol (VENTOLIN HFA) 108 (90 Base) MCG/ACT inhaler INHALE 1-2 PUFFS INTO THE LUNGS EVERY 6 (SIX) HOURS AS NEEDED FOR WHEEZING OR SHORTNESS OF BREATH. 25.5 each 11   atenolol (TENORMIN) 25 MG tablet Take 1 tablet (25 mg total) by mouth daily. 90 tablet 3   cetirizine (ZYRTEC) 10 MG tablet TAKE 1 TABLET BY MOUTH EVERY DAY 90 tablet 3   citalopram (CELEXA) 10 MG tablet TAKE 1 TABLET BY MOUTH EVERY DAY 90 tablet 3   clonazePAM (KLONOPIN) 0.5 MG tablet TAKE 1 TABLET BY MOUTH TWICE A DAY AS NEEDED FOR ANXIETY 60 tablet 2   diphenhydrAMINE (BENADRYL) 25 MG tablet Take 1 tablet (25 mg total) by mouth every 6 (  six) hours as needed. 20 tablet 0   famotidine (PEPCID) 20 MG tablet TAKE 1 TABLET BY MOUTH EVERYDAY AT BEDTIME 90 tablet 3   finasteride (PROSCAR) 5 MG tablet Take 1 tablet (5 mg total) by mouth daily. 90 tablet 3   lactulose (CHRONULAC) 10 GM/15ML solution Take 45 mLs (30 g total) by mouth daily as needed for mild constipation. 236 mL 1   levocetirizine (XYZAL) 5 MG tablet Take 1 tablet (5 mg total) by mouth daily as needed for allergies. 90 tablet 1   losartan-hydrochlorothiazide (HYZAAR) 50-12.5 MG tablet TAKE 1 TABLET BY MOUTH EVERY DAY 90 tablet 3   pantoprazole (PROTONIX) 40 MG tablet Take 1 tablet (40 mg total) by mouth 2 (two) times daily. 180 tablet 3   polyethylene glycol powder (GLYCOLAX/MIRALAX) 17 GM/SCOOP powder Take 17 g by mouth daily. 3350 g 1   potassium chloride (KLOR-CON) 8 MEQ tablet Take 1 tablet (8 mEq total) by mouth daily. 90 tablet 3    predniSONE (DELTASONE) 20 MG tablet Take 2 tablets (40 mg total) by mouth daily. 8 tablet 0   rosuvastatin (CRESTOR) 40 MG tablet TAKE 1 TABLET BY MOUTH EVERY DAY 90 tablet 3   simvastatin (ZOCOR) 40 MG tablet Take 1 tablet (40 mg total) by mouth daily. 90 tablet 1   sucralfate (CARAFATE) 1 GM/10ML suspension Take 10 mLs (1 g total) by mouth 4 (four) times daily -  with meals and at bedtime. 420 mL 0   tiZANidine (ZANAFLEX) 4 MG tablet Take 1 tablet (4 mg total) by mouth every 6 (six) hours as needed for muscle spasms. 40 tablet 1   traMADol (ULTRAM) 50 MG tablet TAKE 1 TABLET BY MOUTH EVERY 6 HOURS AS NEEDED. 60 tablet 1   triamcinolone (NASACORT) 55 MCG/ACT AERO nasal inhaler Place 2 sprays into the nose daily. 3 Inhaler 3   No current facility-administered medications on file prior to visit.    No Known Allergies  Family History  Problem Relation Age of Onset   Cancer Mother        throat & lung   Coronary artery disease Father    Hypertension Father    Cancer Sister    Alcohol abuse Brother    Thyroid disease Neg Hx    Colon cancer Neg Hx    Esophageal cancer Neg Hx    Stomach cancer Neg Hx    Rectal cancer Neg Hx     BP (!) 170/86    Pulse 93    Ht 5\' 6"  (1.676 m)    Wt 167 lb 6.4 oz (75.9 kg)    SpO2 98%    BMI 27.02 kg/m    Review of Systems denies weight loss, sob, fever, excessive diaphoresis, anxiety, and heat intolerance.      Objective:   Physical Exam VS: see vs page GEN: no distress NECK: supple, thyroid is not enlarged MUSCULOSKELETAL: gait is normal and steady.   EXTEMITIES: no leg edema. NEURO: No tremor SKIN: Not diaphoretic.   Lab Results  Component Value Date   TSH 0.57 04/30/2021   I have reviewed outside records, and summarized: Pt was noted to have hyperthyroidism, and referred here.  I last saw pt in 2018.  Hyperthyroidism was well-controlled then    Assessment & Plan:  Hyperthyroidism: well-controlled  Patient Instructions  Your  blood pressure is high today.  Please see Dr Jenny Reichmann, to follow this up.   Please continue the same methimazole.  If ever you have fever  while taking methimazole, stop it and call us, even if the reason is obvious, because of the risk of a rare side-effect. It is best to never miss the medication.  However, if you do miss it, next best is to double up the next time.   Please come back for a follow-up appointment in 6 months.

## 2021-06-11 NOTE — Patient Instructions (Signed)
Your blood pressure is high today.  Please see Dr Jenny Reichmann, to follow this up.   Please continue the same methimazole.  If ever you have fever while taking methimazole, stop it and call us, even if the reason is obvious, because of the risk of a rare side-effect. It is best to never miss the medication.  However, if you do miss it, next best is to double up the next time.   Please come back for a follow-up appointment in 6 months.

## 2021-07-28 ENCOUNTER — Telehealth: Payer: Self-pay | Admitting: Internal Medicine

## 2021-07-28 NOTE — Telephone Encounter (Signed)
Left message for patient to call back to schedule Medicare Annual Wellness Visit  ? ?Last AWV  06/29/18 ? ?Please schedule at anytime with [facility] if patient calls the office back.   ? ? ?Any questions, please call me at [phone]  ?

## 2021-07-29 ENCOUNTER — Ambulatory Visit: Payer: Medicare Other | Admitting: Internal Medicine

## 2021-07-29 DIAGNOSIS — H43813 Vitreous degeneration, bilateral: Secondary | ICD-10-CM | POA: Diagnosis not present

## 2021-07-31 ENCOUNTER — Ambulatory Visit (INDEPENDENT_AMBULATORY_CARE_PROVIDER_SITE_OTHER): Payer: Medicare Other | Admitting: Internal Medicine

## 2021-07-31 ENCOUNTER — Encounter: Payer: Self-pay | Admitting: Internal Medicine

## 2021-07-31 VITALS — BP 138/60 | HR 61 | Temp 98.3°F | Ht 66.0 in | Wt 177.0 lb

## 2021-07-31 DIAGNOSIS — J309 Allergic rhinitis, unspecified: Secondary | ICD-10-CM | POA: Diagnosis not present

## 2021-07-31 DIAGNOSIS — I10 Essential (primary) hypertension: Secondary | ICD-10-CM | POA: Diagnosis not present

## 2021-07-31 DIAGNOSIS — R739 Hyperglycemia, unspecified: Secondary | ICD-10-CM

## 2021-07-31 DIAGNOSIS — E559 Vitamin D deficiency, unspecified: Secondary | ICD-10-CM

## 2021-07-31 DIAGNOSIS — E7800 Pure hypercholesterolemia, unspecified: Secondary | ICD-10-CM

## 2021-07-31 MED ORDER — FEXOFENADINE HCL 180 MG PO TABS: 180.0000 mg | ORAL_TABLET | Freq: Every day | ORAL | 2 refills | Status: DC

## 2021-07-31 MED ORDER — METHYLPREDNISOLONE ACETATE 80 MG/ML IJ SUSP
80.0000 mg | Freq: Once | INTRAMUSCULAR | Status: AC
  Administered 2021-07-31: 80 mg via INTRAMUSCULAR

## 2021-07-31 MED ORDER — TRIAMCINOLONE ACETONIDE 55 MCG/ACT NA AERO: 2.0000 | INHALATION_SPRAY | Freq: Every day | NASAL | 3 refills | Status: DC

## 2021-07-31 NOTE — Patient Instructions (Addendum)
You had the steroid shot today for allergies ? ?Please take all new medication as prescribed - the allegra and nasacort for allergies at home ? ?Please take OTC Vitamin D3 at 2000 units per day, indefinitely ? ?Please continue all other medications as before, and refills have been done if requested. ? ?Please have the pharmacy call with any other refills you may need. ? ?Please continue your efforts at being more active, low cholesterol diet, and weight control. ? ?Please keep your appointments with your specialists as you may have planned ? ?Please make an Appointment to return in 6 months, or sooner if needed ?

## 2021-07-31 NOTE — Assessment & Plan Note (Signed)
Lab Results  ?Component Value Date  ? [place] 74 04/30/2021  ? ?Mild uncontrolled, goal ldl < 70.  pt to continue current statin crestor 40 as declines change ? ?

## 2021-07-31 NOTE — Progress Notes (Signed)
Patient ID: Jack [name], male   DOB: 1946/04/25, 76 y.o.   MRN: [id number] ? ? ? ?    Chief Complaint: follow up allergy symptoms, low vit d, htn, hld, hyperglycemia ? ?     HPI:  [name] is a 76 y.o. male here overall doing ok.  Pt denies chest pain, increased sob or doe, wheezing, orthopnea, PND, increased LE swelling, palpitations, dizziness or syncope.   Pt denies polydipsia, polyuria, or new focal neuro s/s.    Pt denies fever, wt loss, night sweats, loss of appetite, or other constitutional symptoms  Does have several wks ongoing nasal allergy symptoms with clearish congestion, itch and sneezing, without fever, pain, ST, cough, swelling or wheezing.  Not taking Vit D.   ?      ?Wt Readings from Last 3 Encounters:  ?07/31/21 177 lb (80.3 kg)  ?06/11/21 167 lb 6.4 oz (75.9 kg)  ?05/26/21 166 lb 0.1 oz (75.3 kg)  ? ?BP Readings from Last 3 Encounters:  ?07/31/21 138/60  ?06/11/21 (!) 170/86  ?05/26/21 (!) 162/81  ? ?      ?Past Medical History:  ?Diagnosis Date  ? ALLERGIC RHINITIS 02/16/2007  ? Arthritis 2013  ? back   ? BACK PAIN 02/16/2007  ? COPD (chronic obstructive pulmonary disease) ([place])   ? Diverticulosis 09/27/2013  ? FOOT PAIN, CHRONIC 02/27/2008  ? GERD (gastroesophageal reflux disease)   ? Hepatitis C antibody test positive   ? HERPES ZOSTER, HX OF 02/27/2008  ? HYPERLIPIDEMIA 02/16/2007  ? HYPERTENSION 02/16/2007  ? Personal history of colonic polyps 03/08/2007  ? PROSTATE SPECIFIC ANTIGEN, ELEVATED 02/16/2007  ? Shortness of breath dyspnea   ? ?Past Surgical History:  ?Procedure Laterality Date  ? COLONOSCOPY    ? FOOT SURGERY    ? reconstruction x's 4 after work accident  ? ? reports that he quit smoking about 37 years ago. His smoking use included cigarettes. He has a 7.50 pack-year smoking history. He has never used smokeless tobacco. He reports that he does not drink alcohol and does not use drugs. ?family history includes Alcohol abuse in his brother; Cancer in his mother and sister;  Coronary artery disease in his father; Hypertension in his father. ?No Known Allergies ?Current Outpatient Medications on File Prior to Visit  ?Medication Sig Dispense Refill  ? albuterol (VENTOLIN HFA) 108 (90 Base) MCG/ACT inhaler INHALE 1-2 PUFFS INTO THE LUNGS EVERY 6 (SIX) HOURS AS NEEDED FOR WHEEZING OR SHORTNESS OF BREATH. 25.5 each 11  ? atenolol (TENORMIN) 25 MG tablet Take 1 tablet (25 mg total) by mouth daily. 90 tablet 3  ? cetirizine (ZYRTEC) 10 MG tablet TAKE 1 TABLET BY MOUTH EVERY DAY 90 tablet 3  ? citalopram (CELEXA) 10 MG tablet TAKE 1 TABLET BY MOUTH EVERY DAY 90 tablet 3  ? clonazePAM (KLONOPIN) 0.5 MG tablet TAKE 1 TABLET BY MOUTH TWICE A DAY AS NEEDED FOR ANXIETY 60 tablet 2  ? diphenhydrAMINE (BENADRYL) 25 MG tablet Take 1 tablet (25 mg total) by mouth every 6 (six) hours as needed. 20 tablet 0  ? famotidine (PEPCID) 20 MG tablet TAKE 1 TABLET BY MOUTH EVERYDAY AT BEDTIME 90 tablet 3  ? finasteride (PROSCAR) 5 MG tablet Take 1 tablet (5 mg total) by mouth daily. 90 tablet 3  ? lactulose (CHRONULAC) 10 GM/15ML solution Take 45 mLs (30 g total) by mouth daily as needed for mild constipation. 236 mL 1  ? levocetirizine (XYZAL) 5 MG tablet Take 1  tablet (5 mg total) by mouth daily as needed for allergies. 90 tablet 1  ? losartan-hydrochlorothiazide (HYZAAR) 50-12.5 MG tablet TAKE 1 TABLET BY MOUTH EVERY DAY 90 tablet 3  ? methimazole (TAPAZOLE) 5 MG tablet Take 1 tablet (5 mg total) by mouth 3 (three) times a week. 36 tablet 3  ? pantoprazole (PROTONIX) 40 MG tablet Take 1 tablet (40 mg total) by mouth 2 (two) times daily. 180 tablet 3  ? polyethylene glycol powder (GLYCOLAX/MIRALAX) 17 GM/SCOOP powder Take 17 g by mouth daily. 3350 g 1  ? potassium chloride (KLOR-CON) 8 MEQ tablet Take 1 tablet (8 mEq total) by mouth daily. 90 tablet 3  ? predniSONE (DELTASONE) 20 MG tablet Take 2 tablets (40 mg total) by mouth daily. 8 tablet 0  ? rosuvastatin (CRESTOR) 40 MG tablet TAKE 1 TABLET BY MOUTH EVERY  DAY 90 tablet 3  ? simvastatin (ZOCOR) 40 MG tablet Take 1 tablet (40 mg total) by mouth daily. 90 tablet 1  ? sucralfate (CARAFATE) 1 GM/10ML suspension Take 10 mLs (1 g total) by mouth 4 (four) times daily -  with meals and at bedtime. 420 mL 0  ? tiZANidine (ZANAFLEX) 4 MG tablet Take 1 tablet (4 mg total) by mouth every 6 (six) hours as needed for muscle spasms. 40 tablet 1  ? traMADol (ULTRAM) 50 MG tablet TAKE 1 TABLET BY MOUTH EVERY 6 HOURS AS NEEDED. 60 tablet 1  ? ?No current facility-administered medications on file prior to visit.  ? ?     ROS:  All others reviewed and negative. ? ?Objective  ? ?     PE:  BP 138/60 (BP Location: Right Arm, Patient Position: Sitting, Cuff Size: Large)   Pulse 61   Temp 98.3 ?F (36.8 ?C) (Oral)   Ht '5\' 6"'$  (1.676 m)   Wt 177 lb (80.3 kg)   SpO2 97%   BMI 28.57 kg/m?  ? ?              Constitutional: Pt appears in NAD ?              HENT: Head: NCAT.  ?              Right Ear: External ear normal.   ?              Left Ear: External ear normal.  Bilat tm's with mild erythema.  Max sinus areas non tender.  Pharynx with mild erythema, no exudate ?              Eyes: . Pupils are equal, round, and reactive to light. Conjunctivae and EOM are normal ?              Nose: without d/c or deformity ?              Neck: Neck supple. Gross normal ROM ?              Cardiovascular: Normal rate and regular rhythm.   ?              Pulmonary/Chest: Effort normal and breath sounds without rales or wheezing.  ?              Abd:  Soft, NT, ND, + BS, no organomegaly ?              Neurological: Pt is alert. At baseline orientation, motor grossly intact ?  Skin: Skin is warm. No rashes, no other new lesions, LE edema - none ?              Psychiatric: Pt behavior is normal without agitation  ? ?Micro: none ? ?Cardiac tracings I have personally interpreted today:  none ? ?Pertinent Radiological findings (summarize): none  ? ?Lab Results  ?Component Value Date  ? WBC 10.6 (H)  05/26/2021  ? HGB 13.0 05/26/2021  ? HCT 39.1 05/26/2021  ? PLT 172 05/26/2021  ? GLUCOSE 152 (H) 05/26/2021  ? CHOL 160 04/30/2021  ? TRIG 117.0 04/30/2021  ? HDL 63.00 04/30/2021  ? LDLDIRECT 89.0 12/06/2017  ? [place] 74 04/30/2021  ? ALT 10 05/26/2021  ? AST 18 05/26/2021  ? NA 140 05/26/2021  ? K 3.3 (L) 05/26/2021  ? CL 105 05/26/2021  ? CREATININE 1.16 05/26/2021  ? BUN 12 05/26/2021  ? CO2 26 05/26/2021  ? TSH 0.57 04/30/2021  ? PSA 2.79 04/30/2021  ? HGBA1C 5.9 04/30/2021  ? ?Assessment/Plan:  ?COLLIN RENGEL is a 76 y.o. Black or African American [2] male with  has a past medical history of ALLERGIC RHINITIS (02/16/2007), Arthritis (2013), BACK PAIN (02/16/2007), COPD (chronic obstructive pulmonary disease) ([place]), Diverticulosis (09/27/2013), FOOT PAIN, CHRONIC (02/27/2008), GERD (gastroesophageal reflux disease), Hepatitis C antibody test positive, HERPES ZOSTER, HX OF (02/27/2008), HYPERLIPIDEMIA (02/16/2007), HYPERTENSION (02/16/2007), Personal history of colonic polyps (03/08/2007), PROSTATE SPECIFIC ANTIGEN, ELEVATED (02/16/2007), and Shortness of breath dyspnea. ? ?Vitamin D deficiency ?Last vitamin D ?Lab Results  ?Component Value Date  ? VD25OH 29.34 (L) 04/30/2021  ? ?Low, to start oral replacement ? ? ?Hyperlipidemia ?Lab Results  ?Component Value Date  ? [place] 74 04/30/2021  ? ?Mild uncontrolled, goal ldl < 70.  pt to continue current statin crestor 40 as declines change ? ? ?Allergic rhinitis ?Mod to severe, for depomedrol 80 IM, also otc allegra and nasacort,  to f/u any worsening symptoms or concerns ? ?Essential hypertension ?BP Readings from Last 3 Encounters:  ?07/31/21 138/60  ?06/11/21 (!) 170/86  ?05/26/21 (!) 162/81  ? ?Stable, pt to continue medical treatment tenormin, hyzaar ? ? ?Hyperglycemia ?Lab Results  ?Component Value Date  ? HGBA1C 5.9 04/30/2021  ? ?Stable, pt to continue current medical treatment  - diet ? ?Followup: Return in about 6 months (around 01/30/2022). ? ?Cathlean Cower, MD 08/02/2021 3:10 PM ?[facility] ?[place] ?Internal Medicine ?

## 2021-07-31 NOTE — Assessment & Plan Note (Signed)
Last vitamin D ?Lab Results  ?Component Value Date  ? VD25OH 29.34 (L) 04/30/2021  ? ?Low, to start oral replacement ? ?

## 2021-08-02 ENCOUNTER — Encounter: Payer: Self-pay | Admitting: Internal Medicine

## 2021-08-02 NOTE — Assessment & Plan Note (Signed)
BP Readings from Last 3 Encounters:  ?07/31/21 138/60  ?06/11/21 (!) 170/86  ?05/26/21 (!) 162/81  ? ?Stable, pt to continue medical treatment tenormin, hyzaar ? ?

## 2021-08-02 NOTE — Assessment & Plan Note (Signed)
Lab Results  ?Component Value Date  ? HGBA1C 5.9 04/30/2021  ? ?Stable, pt to continue current medical treatment  - diet ? ?

## 2021-08-02 NOTE — Assessment & Plan Note (Signed)
Mod to severe, for depomedrol 80 IM, also otc allegra and nasacort,  to f/u any worsening symptoms or concerns ?

## 2021-09-25 ENCOUNTER — Emergency Department (HOSPITAL_BASED_OUTPATIENT_CLINIC_OR_DEPARTMENT_OTHER): Payer: Medicare Other

## 2021-09-25 ENCOUNTER — Encounter (HOSPITAL_BASED_OUTPATIENT_CLINIC_OR_DEPARTMENT_OTHER): Payer: Self-pay | Admitting: Obstetrics and Gynecology

## 2021-09-25 ENCOUNTER — Emergency Department (HOSPITAL_BASED_OUTPATIENT_CLINIC_OR_DEPARTMENT_OTHER)
Admission: EM | Admit: 2021-09-25 | Discharge: 2021-09-25 | Disposition: A | Discharge status: Home / Self Care | Payer: Medicare Other | Attending: Emergency Medicine | Admitting: Emergency Medicine

## 2021-09-25 ENCOUNTER — Other Ambulatory Visit: Payer: Self-pay

## 2021-09-25 DIAGNOSIS — K219 Gastro-esophageal reflux disease without esophagitis: Secondary | ICD-10-CM | POA: Diagnosis not present

## 2021-09-25 DIAGNOSIS — R140 Abdominal distension (gaseous): Secondary | ICD-10-CM | POA: Diagnosis present

## 2021-09-25 DIAGNOSIS — R109 Unspecified abdominal pain: Secondary | ICD-10-CM | POA: Diagnosis not present

## 2021-09-25 DIAGNOSIS — I10 Essential (primary) hypertension: Secondary | ICD-10-CM | POA: Insufficient documentation

## 2021-09-25 DIAGNOSIS — Z79899 Other long term (current) drug therapy: Secondary | ICD-10-CM | POA: Insufficient documentation

## 2021-09-25 LAB — URINALYSIS, ROUTINE W REFLEX MICROSCOPIC
Bilirubin Urine: NEGATIVE
Glucose, UA: NEGATIVE mg/dL
Hgb urine dipstick: NEGATIVE
Leukocytes,Ua: NEGATIVE
Nitrite: NEGATIVE
Protein, ur: 30 mg/dL — AB
Specific Gravity, Urine: 1.032 — ABNORMAL HIGH (ref 1.005–1.030)
pH: 6 (ref 5.0–8.0)

## 2021-09-25 LAB — CBC WITH DIFFERENTIAL/PLATELET
Abs Immature Granulocytes: 0.03 10*3/uL (ref 0.00–0.07)
Basophils Absolute: 0 10*3/uL (ref 0.0–0.1)
Basophils Relative: 0 %
Eosinophils Absolute: 0 10*3/uL (ref 0.0–0.5)
Eosinophils Relative: 0 %
HCT: 42.3 % (ref 39.0–52.0)
Hemoglobin: 14.4 g/dL (ref 13.0–17.0)
Immature Granulocytes: 0 %
Lymphocytes Relative: 18 %
Lymphs Abs: 1.8 10*3/uL (ref 0.7–4.0)
MCH: 32 pg (ref 26.0–34.0)
MCHC: 34 g/dL (ref 30.0–36.0)
MCV: 94 fL (ref 80.0–100.0)
Monocytes Absolute: 0.6 10*3/uL (ref 0.1–1.0)
Monocytes Relative: 6 %
Neutro Abs: 7.3 10*3/uL (ref 1.7–7.7)
Neutrophils Relative %: 76 %
Platelets: 183 10*3/uL (ref 150–400)
RBC: 4.5 MIL/uL (ref 4.22–5.81)
RDW: 11.7 % (ref 11.5–15.5)
WBC: 9.7 10*3/uL (ref 4.0–10.5)
nRBC: 0 % (ref 0.0–0.2)

## 2021-09-25 LAB — LIPASE, BLOOD: Lipase: 31 U/L (ref 11–51)

## 2021-09-25 LAB — COMPREHENSIVE METABOLIC PANEL
ALT: 13 U/L (ref 0–44)
AST: 19 U/L (ref 15–41)
Albumin: 4.2 g/dL (ref 3.5–5.0)
Alkaline Phosphatase: 62 U/L (ref 38–126)
Anion gap: 10 (ref 5–15)
BUN: 19 mg/dL (ref 8–23)
CO2: 24 mmol/L (ref 22–32)
Calcium: 9.6 mg/dL (ref 8.9–10.3)
Chloride: 106 mmol/L (ref 98–111)
Creatinine, Ser: 1.3 mg/dL — ABNORMAL HIGH (ref 0.61–1.24)
GFR, Estimated: 57 mL/min — ABNORMAL LOW (ref >60)
Glucose, Bld: 118 mg/dL — ABNORMAL HIGH (ref 70–99)
Potassium: 3.7 mmol/L (ref 3.5–5.1)
Sodium: 140 mmol/L (ref 135–145)
Total Bilirubin: 0.7 mg/dL (ref 0.3–1.2)
Total Protein: 7.4 g/dL (ref 6.5–8.1)

## 2021-09-25 MED ORDER — FAMOTIDINE IN NACL 20-0.9 MG/50ML-% IV SOLN
20.0000 mg | Freq: Once | INTRAVENOUS | Status: AC
  Administered 2021-09-25: 20 mg via INTRAVENOUS
  Filled 2021-09-25: qty 50

## 2021-09-25 MED ORDER — FAMOTIDINE 20 MG PO TABS: 20.0000 mg | ORAL_TABLET | Freq: Every day | ORAL | 0 refills | Status: DC

## 2021-09-25 MED ORDER — SODIUM CHLORIDE 0.9 % IV BOLUS
1000.0000 mL | Freq: Once | INTRAVENOUS | Status: AC
  Administered 2021-09-25: 1000 mL via INTRAVENOUS

## 2021-09-25 MED ORDER — IOHEXOL 300 MG/ML  SOLN
100.0000 mL | Freq: Once | INTRAMUSCULAR | Status: AC | PRN
  Administered 2021-09-25: 75 mL via INTRAVENOUS

## 2021-09-25 MED ORDER — ALUM & MAG HYDROXIDE-SIMETH 200-200-20 MG/5ML PO SUSP
30.0000 mL | Freq: Once | ORAL | Status: AC
  Administered 2021-09-25: 30 mL via ORAL
  Filled 2021-09-25: qty 30

## 2021-09-25 NOTE — ED Notes (Signed)
Patient transported to CT 

## 2021-09-25 NOTE — ED Triage Notes (Signed)
Patient reports to the ER for feeling bloated. Patient denies abdominal pain but reports some burning and tightness. Patient reports he feels the aforementioned sensations every time he eats.

## 2021-09-25 NOTE — ED Provider Notes (Signed)
[facility] EMERGENCY DEPT Provider Note   CSN: [id number] Arrival date & time: 09/25/21  1227     History  Chief Complaint  Patient presents with   Bloated    Jack [name] is a 76 y.o. male with a past medical history of hypertension, hyperlipidemia, GERD presenting to the ED with a chief complaint of abdominal bloating.  Also reports burning sensation in his stomach.  Symptoms have been intermittent for the past several weeks.  Symptoms are worse after he eats.  He has tried Gas-X with some improvement in his symptoms.  Denies any changes to bowel movements, urination, nausea, vomiting.  Denies any chest pain or shortness of breath.  Denies any abdominal pain.  HPI     Home Medications Prior to Admission medications   Medication Sig Start Date End Date Taking? Authorizing Provider  famotidine (PEPCID) 20 MG tablet Take 1 tablet (20 mg total) by mouth daily. 09/25/21  Yes Lamiya Naas, PA-C  albuterol (VENTOLIN HFA) 108 (90 Base) MCG/ACT inhaler INHALE 1-2 PUFFS INTO THE LUNGS EVERY 6 (SIX) HOURS AS NEEDED FOR WHEEZING OR SHORTNESS OF BREATH. 07/11/20   Biagio Borg, MD  atenolol (TENORMIN) 25 MG tablet Take 1 tablet (25 mg total) by mouth daily. 12/06/17   Biagio Borg, MD  cetirizine (ZYRTEC) 10 MG tablet TAKE 1 TABLET BY MOUTH EVERY DAY 11/28/19   Biagio Borg, MD  citalopram (CELEXA) 10 MG tablet TAKE 1 TABLET BY MOUTH EVERY DAY 08/31/20   Biagio Borg, MD  clonazePAM (KLONOPIN) 0.5 MG tablet TAKE 1 TABLET BY MOUTH TWICE A DAY AS NEEDED FOR ANXIETY 07/06/17   Biagio Borg, MD  diphenhydrAMINE (BENADRYL) 25 MG tablet Take 1 tablet (25 mg total) by mouth every 6 (six) hours as needed. 05/26/21   Carlisle Cater, PA-C  fexofenadine (ALLEGRA) 180 MG tablet Take 1 tablet (180 mg total) by mouth daily. 07/31/21 07/31/22  Biagio Borg, MD  finasteride (PROSCAR) 5 MG tablet Take 1 tablet (5 mg total) by mouth daily. 12/06/17   Biagio Borg, MD  lactulose (CHRONULAC) 10 GM/15ML  solution Take 45 mLs (30 g total) by mouth daily as needed for mild constipation. 08/18/19   Biagio Borg, MD  levocetirizine (XYZAL) 5 MG tablet Take 1 tablet (5 mg total) by mouth daily as needed for allergies. 06/01/16   Biagio Borg, MD  losartan-hydrochlorothiazide [facility]) 50-12.5 MG tablet TAKE 1 TABLET BY MOUTH EVERY DAY 08/31/20   Biagio Borg, MD  methimazole (TAPAZOLE) 5 MG tablet Take 1 tablet (5 mg total) by mouth 3 (three) times a week. 06/11/21   Renato Shin, MD  pantoprazole (PROTONIX) 40 MG tablet Take 1 tablet (40 mg total) by mouth 2 (two) times daily. 04/10/21   Biagio Borg, MD  polyethylene glycol powder [facility]) 17 GM/SCOOP powder Take 17 g by mouth daily. 08/18/19   Biagio Borg, MD  potassium chloride (KLOR-CON) 8 MEQ tablet Take 1 tablet (8 mEq total) by mouth daily. 01/24/21   Biagio Borg, MD  predniSONE (DELTASONE) 20 MG tablet Take 2 tablets (40 mg total) by mouth daily. 05/26/21   Carlisle Cater, PA-C  rosuvastatin (CRESTOR) 40 MG tablet TAKE 1 TABLET BY MOUTH EVERY DAY 10/10/20   Biagio Borg, MD  simvastatin (ZOCOR) 40 MG tablet Take 1 tablet (40 mg total) by mouth daily. 09/24/20   Biagio Borg, MD  sucralfate (CARAFATE) 1 GM/10ML suspension Take 10 mLs (1 g  total) by mouth 4 (four) times daily -  with meals and at bedtime. 01/18/21   Couture, Cortni S, PA-C  tiZANidine (ZANAFLEX) 4 MG tablet Take 1 tablet (4 mg total) by mouth every 6 (six) hours as needed for muscle spasms. 04/10/21   Biagio Borg, MD  traMADol (ULTRAM) 50 MG tablet TAKE 1 TABLET BY MOUTH EVERY 6 HOURS AS NEEDED. 04/28/21   Biagio Borg, MD  triamcinolone (NASACORT) 55 MCG/ACT AERO nasal inhaler Place 2 sprays into the nose daily. 07/31/21   Biagio Borg, MD      Allergies    Patient has no known allergies.    Review of Systems   Review of Systems  Constitutional:  Negative for appetite change, chills and fever.  HENT:  Negative for ear pain, rhinorrhea, sneezing and sore throat.    Eyes:  Negative for photophobia and visual disturbance.  Respiratory:  Negative for cough, chest tightness, shortness of breath and wheezing.   Cardiovascular:  Negative for chest pain and palpitations.  Gastrointestinal:  Negative for abdominal pain, blood in stool, constipation, diarrhea, nausea and vomiting.       +bloating, burning sensation  Genitourinary:  Negative for dysuria, hematuria and urgency.  Musculoskeletal:  Negative for myalgias.  Skin:  Negative for rash.  Neurological:  Negative for dizziness, weakness and light-headedness.   Physical Exam Updated Vital Signs BP 137/74   Pulse 71   Temp 98.1 F (36.7 C)   Resp (!) 22   Ht '5\' 6"'$  (1.676 m)   Wt 74.8 kg   SpO2 98%   BMI 26.63 kg/m  Physical Exam Vitals and nursing note reviewed.  Constitutional:      General: He is not in acute distress.    Appearance: He is well-developed.  HENT:     Head: Normocephalic and atraumatic.     Nose: Nose normal.  Eyes:     General: No scleral icterus.       Left eye: No discharge.     Conjunctiva/sclera: Conjunctivae normal.  Cardiovascular:     Rate and Rhythm: Normal rate and regular rhythm.     Heart sounds: Normal heart sounds. No murmur heard.   No friction rub. No gallop.  Pulmonary:     Effort: Pulmonary effort is normal. No respiratory distress.     Breath sounds: Normal breath sounds.  Abdominal:     General: Bowel sounds are normal. There is no distension.     Palpations: Abdomen is soft.     Tenderness: There is no abdominal tenderness. There is no guarding.     Comments: Abdomen is soft, nontender nondistended.  Musculoskeletal:        General: Normal range of motion.     Cervical back: Normal range of motion and neck supple.  Skin:    General: Skin is warm and dry.     Findings: No rash.  Neurological:     Mental Status: He is alert.     Motor: No abnormal muscle tone.     Coordination: Coordination normal.    ED Results / Procedures / Treatments    Labs (all labs ordered are listed, but only abnormal results are displayed) Labs Reviewed  COMPREHENSIVE METABOLIC PANEL - Abnormal; Notable for the following components:      Result Value   Glucose, Bld 118 (*)    Creatinine, Ser 1.30 (*)    GFR, Estimated 57 (*)    All other components within normal limits  URINALYSIS,  ROUTINE W REFLEX MICROSCOPIC - Abnormal; Notable for the following components:   Specific Gravity, Urine 1.032 (*)    Ketones, ur TRACE (*)    Protein, ur 30 (*)    All other components within normal limits  LIPASE, BLOOD  CBC WITH DIFFERENTIAL/PLATELET    EKG EKG Interpretation  Date/Time:  Thursday September 25 2021 13:05:11 EDT Ventricular Rate:  81 PR Interval:  172 QRS Duration: 89 QT Interval:  362 QTC Calculation: 421 R Axis:   56 Text Interpretation: Sinus rhythm Consider left atrial enlargement Consider anterior infarct No significant change since last tracing Confirmed by Deno Etienne [phone]) on 09/25/2021 1:08:04 PM  Radiology CT ABDOMEN PELVIS W CONTRAST  Result Date: 09/25/2021 CLINICAL DATA:  Abdominal pain, acute, nonlocalized EXAM: CT ABDOMEN AND PELVIS WITH CONTRAST TECHNIQUE: Multidetector CT imaging of the abdomen and pelvis was performed using the standard protocol following bolus administration of intravenous contrast. RADIATION DOSE REDUCTION: This exam was performed according to the departmental dose-optimization program which includes automated exposure control, adjustment of the mA and/or kV according to patient size and/or use of iterative reconstruction technique. CONTRAST:  8m OMNIPAQUE IOHEXOL 300 MG/ML  SOLN COMPARISON:  CT 05/23/2021, 01/18/2021 FINDINGS: Lower chest: Chronic linear scarring within the bilateral lung bases. Stable benign 5 mm nodule along the area of scarring within the right lower lobe. Included lung bases are otherwise clear. Heart size is normal. Hepatobiliary: No focal liver abnormality is seen. No gallstones, gallbladder  wall thickening, or biliary dilatation. Pancreas: Unremarkable. No pancreatic ductal dilatation or surrounding inflammatory changes. Spleen: Normal in size without focal abnormality. Adrenals/Urinary Tract: Unremarkable adrenal glands. Kidneys enhance symmetrically without focal solid lesion, stone, or hydronephrosis. Ureters are nondilated. Mild circumferential urinary bladder wall thickening. Stomach/Bowel: Stomach is within normal limits. Appendix appears normal. Diverticulosis of the left colon. No evidence of bowel wall thickening, distention, or inflammatory changes. Vascular/Lymphatic: Scattered aortoiliac atherosclerotic calcifications without aneurysm. No abdominopelvic lymphadenopathy. Reproductive: Moderate prostatomegaly with significant mass effect upon the base of the urinary bladder. Other: No free fluid. No abdominopelvic fluid collection. No pneumoperitoneum. No abdominal wall hernia. Musculoskeletal: No acute or significant osseous findings. IMPRESSION: 1. No acute abdominopelvic findings. 2. Colonic diverticulosis without evidence of acute diverticulitis. 3. Moderate prostatomegaly with significant mass effect upon the base of the urinary bladder. 4. Mild circumferential urinary bladder wall thickening, likely reflecting a component of chronic bladder outlet obstruction. Correlate with urinalysis to exclude the possibility of cystitis. 5. Aortic Atherosclerosis (ICD10-I70.0). Electronically Signed   By: NDavina PokeD.O.   On: 09/25/2021 15:24    Procedures Procedures    Medications Ordered in ED Medications  famotidine (PEPCID) IVPB 20 mg premix (20 mg Intravenous New Bag/Given 09/25/21 1346)  alum & mag hydroxide-simeth (MAALOX/MYLANTA) 200-200-20 MG/5ML suspension 30 mL (30 mLs Oral Given 09/25/21 1343)  sodium chloride 0.9 % bolus 1,000 mL (1,000 mLs Intravenous New Bag/Given 09/25/21 1505)  iohexol (OMNIPAQUE) 300 MG/ML solution 100 mL (75 mLs Intravenous Contrast Given 09/25/21 1454)     ED Course/ Medical Decision Making/ A&P Clinical Course as of 09/25/21 1552  Thu Sep 25, 2021  1413 Creatinine(!): 1.30 [HK]    Clinical Course User Index [HK] KDelia Heady PA-C                           Medical Decision Making Amount and/or Complexity of Data Reviewed Labs: ordered. Decision-making details documented in ED Course. Radiology: ordered.  Risk OTC drugs. Prescription  drug management.   76 year old male presenting to the ED with abdominal bloating and burning sensation.  Intermittent symptoms for the past several weeks.  Worse when he eats and has improved with Gas-X recently.  Denies any changes to bowel movements, urination, vomiting or nausea.  Denies any abdominal pain.  No chest pain or shortness of breath.  No sick contacts with similar symptoms.  On exam abdomen is soft, nontender nondistended.  Vital signs within normal limits.  Will obtain labs and attempt to treat symptomatically.  Patient reports improvement with symptoms with Pepcid and GI cocktail given here.  Lab work including CBC, lipase unremarkable.  Creatinine of 1.3 which is similar to his baseline.  EKG shows sinus rhythm, no changes from prior tracings.  Urinalysis with some ketones but no signs of infection.  CT of abdomen pelvis was done due to age and symptoms.  There are findings consistent with diverticulosis without evidence of diverticulitis, enlarged prostate which patient is aware of and possible cystitis.  Urinalysis does not indicate infection but was sent for culture.  He is asymptomatic at this time.  Suspect that his symptoms are due to reflux.  I doubt cardiac cause, cholecystitis, diverticulitis or other surgical emergent cause of his symptoms based on his physical exam findings and history.  Patient is requesting discharge home which I feel is reasonable.  We will treat with Pepcid and have him follow-up with PCP.  Return precautions given  Patient is hemodynamically stable, in NAD,  and able to ambulate in the ED. Evaluation does not show pathology that would require ongoing emergent intervention or inpatient treatment. I explained the diagnosis to the patient. Pain has been managed and has no complaints prior to discharge. Patient is comfortable with above plan and is stable for discharge at this time. All questions were answered prior to disposition. Strict return precautions for returning to the ED were discussed. Encouraged follow up with PCP.   An After Visit Summary was printed and given to the patient.   Portions of this note were generated with [employer]. Dictation errors may occur despite best attempts at proofreading.         Final Clinical Impression(s) / ED Diagnoses Final diagnoses:  Gastroesophageal reflux disease, unspecified whether esophagitis present    Rx / DC Orders ED Discharge Orders          Ordered    famotidine (PEPCID) 20 MG tablet  Daily        09/25/21 [place], Jisel Fleet, PA-C 09/25/21 Robertson, Dresser, DO 09/27/21 1101

## 2021-09-25 NOTE — Discharge Instructions (Addendum)
Take the medications as needed to help with your symptoms. Follow-up with your primary care provider. Return to the ER if you start to experience worsening symptoms, chest pain, abdominal pain, uncontrollable vomiting, bloody stools

## 2021-10-22 ENCOUNTER — Other Ambulatory Visit: Payer: Self-pay | Admitting: Internal Medicine

## 2021-11-03 ENCOUNTER — Telehealth: Payer: Self-pay

## 2021-11-03 NOTE — Telephone Encounter (Signed)
Attempted to contact the patient in regards to rescheduling with another provider, voicemail box not set up.

## 2021-12-09 ENCOUNTER — Encounter (HOSPITAL_COMMUNITY): Payer: Self-pay

## 2021-12-09 ENCOUNTER — Emergency Department (HOSPITAL_COMMUNITY)
Admission: EM | Admit: 2021-12-09 | Discharge: 2021-12-09 | Disposition: A | Discharge status: Home / Self Care | Payer: Medicare Other | Attending: Emergency Medicine | Admitting: Emergency Medicine

## 2021-12-09 ENCOUNTER — Emergency Department (HOSPITAL_COMMUNITY): Payer: Medicare Other

## 2021-12-09 ENCOUNTER — Other Ambulatory Visit: Payer: Self-pay

## 2021-12-09 DIAGNOSIS — N390 Urinary tract infection, site not specified: Secondary | ICD-10-CM

## 2021-12-09 DIAGNOSIS — Z8673 Personal history of transient ischemic attack (TIA), and cerebral infarction without residual deficits: Secondary | ICD-10-CM | POA: Insufficient documentation

## 2021-12-09 DIAGNOSIS — E7800 Pure hypercholesterolemia, unspecified: Secondary | ICD-10-CM | POA: Insufficient documentation

## 2021-12-09 DIAGNOSIS — T671XXA Heat syncope, initial encounter: Secondary | ICD-10-CM

## 2021-12-09 DIAGNOSIS — R0689 Other abnormalities of breathing: Secondary | ICD-10-CM | POA: Diagnosis not present

## 2021-12-09 DIAGNOSIS — S2231XA Fracture of one rib, right side, initial encounter for closed fracture: Secondary | ICD-10-CM | POA: Insufficient documentation

## 2021-12-09 DIAGNOSIS — R55 Syncope and collapse: Secondary | ICD-10-CM | POA: Insufficient documentation

## 2021-12-09 DIAGNOSIS — R110 Nausea: Secondary | ICD-10-CM | POA: Diagnosis not present

## 2021-12-09 DIAGNOSIS — I499 Cardiac arrhythmia, unspecified: Secondary | ICD-10-CM | POA: Diagnosis not present

## 2021-12-09 DIAGNOSIS — Z79899 Other long term (current) drug therapy: Secondary | ICD-10-CM | POA: Insufficient documentation

## 2021-12-09 DIAGNOSIS — I10 Essential (primary) hypertension: Secondary | ICD-10-CM | POA: Diagnosis not present

## 2021-12-09 DIAGNOSIS — W1830XA Fall on same level, unspecified, initial encounter: Secondary | ICD-10-CM | POA: Diagnosis not present

## 2021-12-09 DIAGNOSIS — I6381 Other cerebral infarction due to occlusion or stenosis of small artery: Secondary | ICD-10-CM | POA: Diagnosis not present

## 2021-12-09 DIAGNOSIS — E860 Dehydration: Secondary | ICD-10-CM | POA: Diagnosis not present

## 2021-12-09 DIAGNOSIS — Z743 Need for continuous supervision: Secondary | ICD-10-CM | POA: Diagnosis not present

## 2021-12-09 DIAGNOSIS — J323 Chronic sphenoidal sinusitis: Secondary | ICD-10-CM | POA: Insufficient documentation

## 2021-12-09 DIAGNOSIS — R404 Transient alteration of awareness: Secondary | ICD-10-CM | POA: Diagnosis not present

## 2021-12-09 LAB — CBC WITH DIFFERENTIAL/PLATELET
Abs Immature Granulocytes: 0.05 10*3/uL (ref 0.00–0.07)
Basophils Absolute: 0 10*3/uL (ref 0.0–0.1)
Basophils Relative: 0 %
Eosinophils Absolute: 0 10*3/uL (ref 0.0–0.5)
Eosinophils Relative: 0 %
HCT: 43.3 % (ref 39.0–52.0)
Hemoglobin: 14.8 g/dL (ref 13.0–17.0)
Immature Granulocytes: 0 %
Lymphocytes Relative: 11 %
Lymphs Abs: 1.6 10*3/uL (ref 0.7–4.0)
MCH: 33.7 pg (ref 26.0–34.0)
MCHC: 34.2 g/dL (ref 30.0–36.0)
MCV: 98.6 fL (ref 80.0–100.0)
Monocytes Absolute: 1.2 10*3/uL — ABNORMAL HIGH (ref 0.1–1.0)
Monocytes Relative: 9 %
Neutro Abs: 11.6 10*3/uL — ABNORMAL HIGH (ref 1.7–7.7)
Neutrophils Relative %: 80 %
Platelets: 174 10*3/uL (ref 150–400)
RBC: 4.39 MIL/uL (ref 4.22–5.81)
RDW: 12.1 % (ref 11.5–15.5)
WBC: 14.5 10*3/uL — ABNORMAL HIGH (ref 4.0–10.5)
nRBC: 0 % (ref 0.0–0.2)

## 2021-12-09 LAB — URINALYSIS, ROUTINE W REFLEX MICROSCOPIC
Bacteria, UA: NONE SEEN
Bilirubin Urine: NEGATIVE
Glucose, UA: NEGATIVE mg/dL
Hgb urine dipstick: NEGATIVE
Ketones, ur: NEGATIVE mg/dL
Nitrite: NEGATIVE
Protein, ur: NEGATIVE mg/dL
Specific Gravity, Urine: 1.02 (ref 1.005–1.030)
WBC, UA: >50 WBC/hpf — ABNORMAL HIGH (ref 0–5)
pH: 5 (ref 5.0–8.0)

## 2021-12-09 LAB — TROPONIN I (HIGH SENSITIVITY)
Troponin I (High Sensitivity): 9 ng/L (ref <18)
Troponin I (High Sensitivity): 14 ng/L (ref <18)

## 2021-12-09 LAB — BASIC METABOLIC PANEL
Anion gap: 8 (ref 5–15)
BUN: 22 mg/dL (ref 8–23)
CO2: 23 mmol/L (ref 22–32)
Calcium: 10.3 mg/dL (ref 8.9–10.3)
Chloride: 110 mmol/L (ref 98–111)
Creatinine, Ser: 1.46 mg/dL — ABNORMAL HIGH (ref 0.61–1.24)
GFR, Estimated: 50 mL/min — ABNORMAL LOW (ref >60)
Glucose, Bld: 105 mg/dL — ABNORMAL HIGH (ref 70–99)
Potassium: 3.6 mmol/L (ref 3.5–5.1)
Sodium: 141 mmol/L (ref 135–145)

## 2021-12-09 LAB — MAGNESIUM: Magnesium: 2.3 mg/dL (ref 1.7–2.4)

## 2021-12-09 LAB — CBG MONITORING, ED: Glucose-Capillary: 152 mg/dL — ABNORMAL HIGH (ref 70–99)

## 2021-12-09 LAB — CK: Total CK: 88 U/L (ref 49–397)

## 2021-12-09 MED ORDER — SODIUM CHLORIDE 0.9 % IV BOLUS
1000.0000 mL | Freq: Once | INTRAVENOUS | Status: AC
  Administered 2021-12-09: 1000 mL via INTRAVENOUS

## 2021-12-09 MED ORDER — CEFADROXIL 500 MG PO CAPS: 500.0000 mg | ORAL_CAPSULE | Freq: Two times a day (BID) | ORAL | 0 refills | Status: AC

## 2021-12-09 MED ORDER — LACTATED RINGERS IV BOLUS
1000.0000 mL | Freq: Once | INTRAVENOUS | Status: AC
  Administered 2021-12-09: 1000 mL via INTRAVENOUS

## 2021-12-09 NOTE — ED Provider Notes (Signed)
[facility] DEPT Provider Note   CSN: [id number] Arrival date & time: 12/09/21  1247     History  Chief Complaint  Patient presents with   Loss of Consciousness    Jack [name] is a 76 y.o. male present emerged department loss of consciousness.  Physical and history is provided by EMS.  The patient reports that he was riding on his tractor today, doing yard work, pulling logs, in the high heat, reports he not been drinking water, and only had a Pepsi, and when he climbed off of his tractor afternoon, began to feel very lightheaded.  He was able to walk a few steps towards his house and he said his vision started to darken and then he fell down and lost consciousness.  He is not certain how long he may have been unconscious.  He is not on blood thinners.  He does not recall striking his head.  He denies headache.  He reports he now feels back to his baseline mental status.  He denies prior history of syncope.  He denies history of coronary disease or MI.  He says his blood pressures can be very labile, but he does take atenolol for high blood pressure typically in the morning, did take his medicines.  The patient's brother and sister at bedside reported the patient tends to "push himself too much in the heat".  He does a lot of physical work and yard work.  The patient reports that he does not drink a lot of water, typically soda, and his urine is typically quite dark.  HPI     Home Medications Prior to Admission medications   Medication Sig Start Date End Date Taking? Authorizing Provider  albuterol (VENTOLIN HFA) 108 (90 Base) MCG/ACT inhaler INHALE 1-2 PUFFS INTO THE LUNGS EVERY 6 (SIX) HOURS AS NEEDED FOR WHEEZING OR SHORTNESS OF BREATH. 07/11/20   Biagio Borg, MD  atenolol (TENORMIN) 25 MG tablet Take 1 tablet (25 mg total) by mouth daily. 12/06/17   Biagio Borg, MD  cetirizine (ZYRTEC) 10 MG tablet TAKE 1 TABLET BY MOUTH EVERY DAY 11/28/19   Biagio Borg, MD  citalopram (CELEXA) 10 MG tablet TAKE 1 TABLET BY MOUTH EVERY DAY 10/22/21   Biagio Borg, MD  clonazePAM (KLONOPIN) 0.5 MG tablet TAKE 1 TABLET BY MOUTH TWICE A DAY AS NEEDED FOR ANXIETY 07/06/17   Biagio Borg, MD  diphenhydrAMINE (BENADRYL) 25 MG tablet Take 1 tablet (25 mg total) by mouth every 6 (six) hours as needed. 05/26/21   Carlisle Cater, PA-C  famotidine (PEPCID) 20 MG tablet TAKE 1 TABLET BY MOUTH EVERYDAY AT BEDTIME 10/22/21   Biagio Borg, MD  fexofenadine (ALLEGRA) 180 MG tablet Take 1 tablet (180 mg total) by mouth daily. 07/31/21 07/31/22  Biagio Borg, MD  finasteride (PROSCAR) 5 MG tablet Take 1 tablet (5 mg total) by mouth daily. 12/06/17   Biagio Borg, MD  lactulose (CHRONULAC) 10 GM/15ML solution Take 45 mLs (30 g total) by mouth daily as needed for mild constipation. 08/18/19   Biagio Borg, MD  levocetirizine (XYZAL) 5 MG tablet Take 1 tablet (5 mg total) by mouth daily as needed for allergies. 06/01/16   Biagio Borg, MD  losartan-hydrochlorothiazide [facility]) 50-12.5 MG tablet TAKE 1 TABLET BY MOUTH EVERY DAY 10/22/21   Biagio Borg, MD  methimazole (TAPAZOLE) 5 MG tablet Take 1 tablet (5 mg total) by mouth 3 (three) times a  week. 06/11/21   Renato Shin, MD  pantoprazole (PROTONIX) 40 MG tablet Take 1 tablet (40 mg total) by mouth 2 (two) times daily. 04/10/21   Biagio Borg, MD  polyethylene glycol powder [facility]) 17 GM/SCOOP powder Take 17 g by mouth daily. 08/18/19   Biagio Borg, MD  potassium chloride (KLOR-CON) 8 MEQ tablet Take 1 tablet (8 mEq total) by mouth daily. 01/24/21   Biagio Borg, MD  predniSONE (DELTASONE) 20 MG tablet Take 2 tablets (40 mg total) by mouth daily. 05/26/21   Carlisle Cater, PA-C  rosuvastatin (CRESTOR) 40 MG tablet TAKE 1 TABLET BY MOUTH EVERY DAY 10/10/20   Biagio Borg, MD  simvastatin (ZOCOR) 40 MG tablet Take 1 tablet (40 mg total) by mouth daily. 09/24/20   Biagio Borg, MD  sucralfate (CARAFATE) 1 GM/10ML suspension Take  10 mLs (1 g total) by mouth 4 (four) times daily -  with meals and at bedtime. 01/18/21   Couture, Cortni S, PA-C  tiZANidine (ZANAFLEX) 4 MG tablet Take 1 tablet (4 mg total) by mouth every 6 (six) hours as needed for muscle spasms. 04/10/21   Biagio Borg, MD  traMADol (ULTRAM) 50 MG tablet TAKE 1 TABLET BY MOUTH EVERY 6 HOURS AS NEEDED. 04/28/21   Biagio Borg, MD  triamcinolone (NASACORT) 55 MCG/ACT AERO nasal inhaler Place 2 sprays into the nose daily. 07/31/21   Biagio Borg, MD      Allergies    Patient has no known allergies.    Review of Systems   Review of Systems  Physical Exam Updated Vital Signs BP 112/79 (BP Location: Right Arm)   Pulse 98   Temp (!) 97.5 F (36.4 C) (Oral)   Resp 20   Ht '5\' 6"'$  (1.676 m)   Wt 74.4 kg   SpO2 95%   BMI 26.47 kg/m  Physical Exam Constitutional:      General: He is not in acute distress. HENT:     Head: Normocephalic and atraumatic.  Eyes:     Conjunctiva/sclera: Conjunctivae normal.     Pupils: Pupils are equal, round, and reactive to light.  Cardiovascular:     Rate and Rhythm: Normal rate and regular rhythm.  Pulmonary:     Effort: Pulmonary effort is normal. No respiratory distress.  Abdominal:     General: There is no distension.     Tenderness: There is no abdominal tenderness.  Skin:    General: Skin is warm and dry.  Neurological:     General: No focal deficit present.     Mental Status: He is alert. Mental status is at baseline.  Psychiatric:        Mood and Affect: Mood normal.        Behavior: Behavior normal.     ED Results / Procedures / Treatments   Labs (all labs ordered are listed, but only abnormal results are displayed) Labs Reviewed  CBC WITH DIFFERENTIAL/PLATELET - Abnormal; Notable for the following components:      Result Value   WBC 14.5 (*)    Neutro Abs 11.6 (*)    Monocytes Absolute 1.2 (*)    All other components within normal limits  CBG MONITORING, ED - Abnormal; Notable for the  following components:   Glucose-Capillary 152 (*)    All other components within normal limits  BASIC METABOLIC PANEL  MAGNESIUM  URINALYSIS, ROUTINE W REFLEX MICROSCOPIC  CK  TROPONIN I (HIGH SENSITIVITY)  TROPONIN I (HIGH SENSITIVITY)  EKG EKG Interpretation  Date/Time:  Tuesday December 09 2021 12:59:22 EDT Ventricular Rate:  98 PR Interval:  181 QRS Duration: 94 QT Interval:  385 QTC Calculation: 492 R Axis:   54 Text Interpretation: Sinus rhythm Consider anterior infarct Confirmed by Octaviano Glow [phone]) on 12/09/2021 1:46:48 PM  Radiology DG Chest Portable 1 View  Result Date: 12/09/2021 CLINICAL DATA:  Syncope EXAM: PORTABLE CHEST 1 VIEW COMPARISON:  Radiograph 04/30/2021 FINDINGS: Unchanged cardiomediastinal silhouette. There is no focal airspace consolidation. There is no pleural effusion. No pneumothorax. There is evidence of a right posterolateral eighth rib fracture. Thoracic spondylosis. IMPRESSION: Evidence of right posterolateral eighth rib fracture, correlate with point tenderness. No acute cardiopulmonary disease. Electronically Signed   By: Maurine Simmering M.D.   On: 12/09/2021 14:51   CT Head Wo Contrast  Result Date: 12/09/2021 CLINICAL DATA:  Syncope EXAM: CT HEAD WITHOUT CONTRAST TECHNIQUE: Contiguous axial images were obtained from the base of the skull through the vertex without intravenous contrast. RADIATION DOSE REDUCTION: This exam was performed according to the departmental dose-optimization program which includes automated exposure control, adjustment of the mA and/or kV according to patient size and/or use of iterative reconstruction technique. COMPARISON:  None Available. FINDINGS: Brain: No acute intracranial findings are seen in noncontrast CT brain. There are no signs of bleeding within the cranium. Cortical sulci are prominent. There is 12 mm old lacunar infarct in right basal ganglia. There is decreased density in periventricular and subcortical white  matter. Vascular: Unremarkable. Skull: Unremarkable. Sinuses/Orbits: There is almost complete opacification of right side of sphenoid sinus. There are no air-fluid levels in the visualized paranasal sinuses. Other: None. IMPRESSION: No acute intracranial findings are seen in noncontrast CT brain. Atrophy. Small vessel disease. 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 Chronic sphenoid sinusitis. Electronically Signed   By: Elmer Picker M.D.   On: 12/09/2021 14:12    Procedures Procedures    Medications Ordered in ED Medications  sodium chloride 0.9 % bolus 1,000 mL (has no administration in time range)    ED Course/ Medical Decision Making/ A&P Clinical Course as of 12/09/21 1520  Tue Dec 09, 2021  1517 Heat exhaustion, pending labs, could go home [MK]    Clinical Course User Index [MK] Kommor, Debe Coder, MD                           Medical Decision Making Amount and/or Complexity of Data Reviewed Labs: ordered. Radiology: ordered.   This patient presents to the ED with concern for syncope versus near syncope. This involves an extensive number of treatment options, and is a complaint that carries with it a high risk of complications and morbidity.  The differential diagnosis includes over the static hypotension versus dehydration versus anemia versus arrhythmia versus infection versus atypical ACS versus other  Co-morbidities that complicate the patient evaluation: Patient does have cardiac risk factors with high blood pressure, high cholesterol.  Additional history obtained from EMS, patient's brother and sister at bedside  External records from outside source obtained and reviewed including stress test January 2023 with a normal study, low risk, per report.  I ordered and personally interpreted labs.  The pertinent results include: Labs pending at the time of signout  I ordered imaging studies including x-ray of the chest, CT of the head I independently  visualized and interpreted imaging which showed no acute findings CT of the brain, x-ray questioning a rib fracture although  the patient does not have any chest pain or tenderness.  May also be posture related I agree with the radiologist interpretation  The patient was maintained on a cardiac monitor.  I personally viewed and interpreted the cardiac monitored which showed an underlying rhythm of: Sinus rhythm  Per my interpretation the patient's ECG shows sinus rhythm no acute ischemic findings  I ordered medication including IV fluids for suspected heat exhaustion and dehydration  Patient signed out to Dr Matilde Sprang EDP at 3:15 pm, pending follow-up on labs including troponin levels, assessment of hydration status with electrolyte levels, creatinine level.        Final Clinical Impression(s) / ED Diagnoses Final diagnoses:  None    Rx / DC Orders ED Discharge Orders     None         Ferrell Flam, Carola Rhine, MD 12/09/21 1521

## 2021-12-09 NOTE — ED Triage Notes (Signed)
Pt arrived via ems, a&o 4. Riding tractor at home this morning came in house and passed out, awake and diaphoretic on ems arrival. BM when pt passed out. Unknown if head injury pt not on blood thinners.

## 2021-12-09 NOTE — ED Provider Notes (Signed)
  Physical Exam  BP 123/65   Pulse 65   Temp 97.8 F (36.6 C) (Oral)   Resp (!) 26   Ht '5\' 6"'$  (1.676 m)   Wt 74.4 kg   SpO2 98%   BMI 26.47 kg/m   Physical Exam Constitutional:      General: He is not in acute distress.    Appearance: Normal appearance.  HENT:     Head: Normocephalic and atraumatic.     Nose: No congestion or rhinorrhea.  Eyes:     General:        Right eye: No discharge.        Left eye: No discharge.     Extraocular Movements: Extraocular movements intact.     Pupils: Pupils are equal, round, and reactive to light.  Cardiovascular:     Rate and Rhythm: Normal rate and regular rhythm.     Heart sounds: No murmur heard. Pulmonary:     Effort: No respiratory distress.     Breath sounds: No wheezing or rales.  Abdominal:     General: There is no distension.     Tenderness: There is no abdominal tenderness.  Musculoskeletal:        General: Normal range of motion.     Cervical back: Normal range of motion.  Skin:    General: Skin is warm and dry.  Neurological:     General: No focal deficit present.     Mental Status: He is alert.     Procedures  Procedures  ED Course / MDM   Clinical Course as of 12/09/21 2059  Tue Dec 09, 2021  1517 Heat exhaustion, pending labs, could go home [MK]    Clinical Course User Index [MK] Halana Deisher, Debe Coder, MD   Medical Decision Making Amount and/or Complexity of Data Reviewed Labs: ordered. Radiology: ordered.  Risk Prescription drug management.   Patient received in handoff.  Heat exhaustion with syncope today.  Pending laboratory evaluation at time of signout.  For evaluation with a mild creatinine elevation to 1.46, urinalysis with large leuk esterase and greater than 50 white blood cells, CK normal.  Troponin normal.  Patient very well-appearing on my examination and is requesting discharge.  His story of being in the 100 degree heat today with no p.o. intake prior to syncopized and certainly appears  consistent with heat associated syncope and he was appropriately fluid resuscitated here in the emergency department prior to discharge.  We will cover empirically with Duricef and a urine culture was sent for his urinalysis findings.  Patient was given strict return precautions and then discharged.       Teressa Lower, MD 12/09/21 2100

## 2021-12-11 LAB — URINE CULTURE: Culture: 20000 — AB

## 2021-12-12 ENCOUNTER — Telehealth: Payer: Self-pay | Admitting: *Deleted

## 2021-12-12 NOTE — Telephone Encounter (Signed)
Post ED Visit - Positive Culture Follow-up  Culture report reviewed by antimicrobial stewardship pharmacist: [place] Team '[]'$  Elenor Quinones, Pharm.D. '[]'$  Heide Guile, Pharm.D., BCPS AQ-ID '[]'$  Parks Neptune, Pharm.D., BCPS '[]'$  Alycia Rossetti, Pharm.D., BCPS '[]'$  [place], Pharm.D., BCPS, AAHIVP '[]'$  Legrand Como, Pharm.D., BCPS, AAHIVP '[]'$  Salome Arnt, PharmD, BCPS '[]'$  Johnnette Gourd, PharmD, BCPS '[]'$  Granito Better, PharmD, BCPS '[]'$  Leeroy Cha, PharmD '[]'$  Laqueta Linden, PharmD, BCPS '[]'$  Albertina Parr, PharmD  [place] Team '[]'$  Leodis Sias, PharmD '[]'$  Lindell Spar, PharmD '[]'$  Royetta Asal, PharmD '[]'$  Graylin Shiver, Rph '[]'$  Rema Fendt) Glennon Mac, PharmD '[]'$  Arlyn Dunning, PharmD '[]'$  Netta Cedars, PharmD '[]'$  Dia Sitter, PharmD '[]'$  Leone Haven, PharmD '[]'$  Gretta Arab, PharmD '[]'$  Theodis Shove, PharmD '[]'$  Peggyann Juba, PharmD '[]'$  Reuel Boom, PharmD   Positive urine culture Treated with Cefadroxil, organism sensitive to the same and no further patient follow-up is required at this time. Royetta Asal, PharmD  Harlon Flor Talley 12/12/2021, 12:47 PM

## 2021-12-13 ENCOUNTER — Other Ambulatory Visit: Payer: Self-pay | Admitting: Internal Medicine

## 2021-12-13 NOTE — Telephone Encounter (Signed)
Please refill as per office routine med refill policy (all routine meds to be refilled for 3 mo or monthly (per pt preference) up to one year from last visit, then month to month grace period for 3 mo, then further med refills will have to be denied) ? ?

## 2021-12-15 ENCOUNTER — Encounter: Payer: Self-pay | Admitting: Internal Medicine

## 2021-12-15 ENCOUNTER — Ambulatory Visit (INDEPENDENT_AMBULATORY_CARE_PROVIDER_SITE_OTHER): Payer: Medicare Other | Admitting: Internal Medicine

## 2021-12-15 VITALS — BP 122/70 | HR 65 | Temp 98.1°F | Ht 66.0 in | Wt 168.0 lb

## 2021-12-15 DIAGNOSIS — R55 Syncope and collapse: Secondary | ICD-10-CM | POA: Diagnosis not present

## 2021-12-15 DIAGNOSIS — N401 Enlarged prostate with lower urinary tract symptoms: Secondary | ICD-10-CM

## 2021-12-15 DIAGNOSIS — G4700 Insomnia, unspecified: Secondary | ICD-10-CM | POA: Diagnosis not present

## 2021-12-15 DIAGNOSIS — R3914 Feeling of incomplete bladder emptying: Secondary | ICD-10-CM | POA: Diagnosis not present

## 2021-12-15 DIAGNOSIS — N390 Urinary tract infection, site not specified: Secondary | ICD-10-CM | POA: Diagnosis not present

## 2021-12-15 DIAGNOSIS — M5412 Radiculopathy, cervical region: Secondary | ICD-10-CM | POA: Diagnosis not present

## 2021-12-15 MED ORDER — GABAPENTIN 300 MG PO CAPS: ORAL_CAPSULE | ORAL | 1 refills | Status: DC

## 2021-12-15 NOTE — Assessment & Plan Note (Signed)
Pt to restart proscar, hopefully to avoid further uti

## 2021-12-15 NOTE — Assessment & Plan Note (Signed)
Mostly due to pain per pt; for gabapentin as above

## 2021-12-15 NOTE — Assessment & Plan Note (Signed)
Apparently due to low volume, tx with IVF and resolved,  to f/u any worsening symptoms or concerns

## 2021-12-15 NOTE — Patient Instructions (Signed)
Please take all new medication as prescribed - the gabapentin at bedtime for pain and sleep  Please continue all other medications as before, and refills have been done if requested.  Please have the pharmacy call with any other refills you may need.  Please continue your efforts at being more active, low cholesterol diet, and weight control.  You are otherwise up to date with prevention measures today.  Please keep your appointments with your specialists as you may have planned  Please make an Appointment to return in 6 months, or sooner if needed

## 2021-12-15 NOTE — Progress Notes (Signed)
Patient ID: Jack [name], male   DOB: 23-Feb-1946, 76 y.o.   MRN: [id number]        Chief Complaint: follow up recent syncope, dehydration and UTI       HPI:  Jack [name] is a 76 y.o. male here with wife after episode of syncope aug 15 on walking in to the kitchen with her after about 2 hours on the tractor in the heat, sort of slumped on her to the ground, no injury, LOC only few seconds only, was able to get up to lie on the bed, EMS called, tx in ED with 2L IVF and rx cephalasporin at d/c to which he has been compliant for UTI.  Denies urinary symptoms such as dysuria, frequency, urgency, flank pain, hematuria or n/v, fever, chills.  Also has no other new complaints post dc - Pt denies chest pain, increased sob or doe, wheezing, orthopnea, PND, increased LE swelling, palpitations, dizziness or syncope.   Pt denies polydipsia, polyuria, or new focal neuro s/s.   Pt state has also seen urology about 2 wks ago and BPH med restarted (I think proscar) .  Only other complaint is several nights of left neck pain waking him up about 3 am several nights with pain radiating to the elbow, cant get back to sleep, but no other LUE pain, weakness, numbness.       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 His smoking use included cigarettes. He has a 7.50 pack-year smoking history. He has never used smokeless tobacco. He reports that he does not drink alcohol and does not use drugs. family history includes Alcohol abuse in his brother; Cancer in his mother and sister; Coronary artery disease in his father; Hypertension in his father. No Known Allergies Current Outpatient Medications on File Prior to Visit  Medication Sig Dispense Refill   albuterol (VENTOLIN HFA) 108 (90 Base) MCG/ACT inhaler INHALE 1-2 PUFFS INTO THE LUNGS EVERY 6 (SIX) HOURS AS NEEDED FOR WHEEZING OR SHORTNESS OF BREATH. 25.5 each 11   atenolol (TENORMIN) 25 MG tablet Take 1 tablet (25 mg total) by mouth daily. 90 tablet 3   cefadroxil (DURICEF) 500 MG capsule Take 1 capsule (500 mg total) by mouth 2 (two) times daily for 7 days. 14 capsule 0   cetirizine (ZYRTEC) 10 MG tablet TAKE 1 TABLET BY MOUTH EVERY DAY 90 tablet 3   citalopram (CELEXA) 10 MG tablet TAKE 1 TABLET BY MOUTH EVERY  DAY 90 tablet 3   clonazePAM (KLONOPIN) 0.5 MG tablet TAKE 1 TABLET BY MOUTH TWICE A DAY AS NEEDED FOR ANXIETY 60 tablet 2   diphenhydrAMINE (BENADRYL) 25 MG tablet Take 1 tablet (25 mg total) by mouth every 6 (six) hours as needed. 20 tablet 0   famotidine (PEPCID) 20 MG tablet TAKE 1 TABLET BY MOUTH EVERYDAY AT BEDTIME 90 tablet 3   fexofenadine (ALLEGRA) 180 MG tablet Take 1 tablet (180 mg total) by mouth daily. 30 tablet 2   finasteride (PROSCAR) 5 MG tablet Take 1 tablet (5 mg total) by mouth daily. 90 tablet 3   lactulose (CHRONULAC) 10 GM/15ML solution Take 45 mLs (30 g total) by mouth daily as needed for mild constipation. 236 mL 1   levocetirizine (XYZAL) 5 MG tablet Take 1 tablet (5 mg total) by mouth daily as needed for allergies. 90 tablet 1    losartan-hydrochlorothiazide (HYZAAR) 50-12.5 MG tablet TAKE 1 TABLET BY MOUTH EVERY DAY 90 tablet 3   methimazole (TAPAZOLE) 5 MG tablet Take 1 tablet (5 mg total) by mouth 3 (three) times a week. 36 tablet 3   pantoprazole (PROTONIX) 40 MG tablet Take 1 tablet (40 mg total) by mouth 2 (two) times daily. 180 tablet 3   polyethylene glycol powder (GLYCOLAX/MIRALAX) 17 GM/SCOOP powder Take 17 g by mouth daily. 3350 g 1   potassium chloride (KLOR-CON) 8 MEQ tablet Take 1 tablet (8 mEq total) by mouth daily. 90 tablet 3   predniSONE (DELTASONE) 20 MG tablet Take 2 tablets (40 mg total) by mouth daily. 8 tablet 0   rosuvastatin (CRESTOR) 40 MG tablet TAKE 1 TABLET BY MOUTH EVERY DAY 90 tablet 3   simvastatin (ZOCOR) 40 MG tablet Take 1 tablet (40 mg total) by mouth daily. 90 tablet 1   sucralfate (CARAFATE) 1 GM/10ML suspension Take 10 mLs (1 g total) by mouth 4 (four) times daily -  with meals and at bedtime. 420 mL 0   tiZANidine (ZANAFLEX) 4 MG tablet Take 1 tablet (4 mg total) by mouth every 6 (six) hours as needed for muscle spasms. 40 tablet 1   traMADol (ULTRAM) 50 MG tablet TAKE 1 TABLET BY MOUTH EVERY 6 HOURS AS NEEDED. 60 tablet 1   triamcinolone (NASACORT) 55 MCG/ACT AERO nasal inhaler Place 2 sprays into the nose daily. 3 each 3   No current facility-administered medications on file prior to visit.        ROS:  All others reviewed and negative.  Objective        PE:  BP 122/70 (BP Location: Right Arm, Patient Position: Sitting, Cuff Size: Large)   Pulse 65   Temp 98.1 F (36.7 C) (Oral)   Ht '5\' 6"'$  (1.676 m)   Wt 168 lb (76.2 kg)   SpO2 95%   BMI 27.12 kg/m                 Constitutional: Pt appears in NAD               HENT: Head: NCAT.                Right Ear: External ear normal.                 Left Ear: External ear normal.                Eyes: . Pupils are equal, round, and reactive to light. Conjunctivae and EOM are normal  Nose: without d/c or  deformity               Neck: Neck supple. Gross normal ROM               Cardiovascular: Normal rate and regular rhythm.                 Pulmonary/Chest: Effort normal and breath sounds without rales or wheezing.                Abd:  Soft, NT, ND, + BS, no organomegaly               Neurological: Pt is alert. At baseline orientation, motor grossly intact               Skin: Skin is warm. No rashes, no other new lesions, LE edema - none               Psychiatric: Pt behavior is normal without agitation   Micro: none  Cardiac tracings I have personally interpreted today:  none  Pertinent Radiological findings (summarize): none   Lab Results  Component Value Date   WBC 14.5 (H) 12/09/2021   HGB 14.8 12/09/2021   HCT 43.3 12/09/2021   PLT 174 12/09/2021   GLUCOSE 105 (H) 12/09/2021   CHOL 160 04/30/2021   TRIG 117.0 04/30/2021   HDL 63.00 04/30/2021   LDLDIRECT 89.0 12/06/2017   LDLCALC 74 04/30/2021   ALT 13 09/25/2021   AST 19 09/25/2021   NA 141 12/09/2021   K 3.6 12/09/2021   CL 110 12/09/2021   CREATININE 1.46 (H) 12/09/2021   BUN 22 12/09/2021   CO2 23 12/09/2021   TSH 0.57 04/30/2021   PSA 2.79 04/30/2021   HGBA1C 5.9 04/30/2021   Assessment/Plan:  Jack [name] is a 76 y.o. Black or African American [2] male with  has a past medical history of ALLERGIC RHINITIS (02/16/2007), Arthritis (2013), BACK PAIN (02/16/2007), COPD (chronic obstructive pulmonary disease) ([place]), Diverticulosis (09/27/2013), FOOT PAIN, CHRONIC (02/27/2008), GERD (gastroesophageal reflux disease), Hepatitis C antibody test positive, HERPES ZOSTER, HX OF (02/27/2008), HYPERLIPIDEMIA (02/16/2007), HYPERTENSION (02/16/2007), Personal history of colonic polyps (03/08/2007), PROSTATE SPECIFIC ANTIGEN, ELEVATED (02/16/2007), and Shortness of breath dyspnea.  Radiculitis of left cervical region Mild to mod, exam benign but wakes him up at night unable to sleep; for gabapentin 300 qhs prn, consider MRI for  persistent or worsening symptoms   Insomnia Mostly due to pain per pt; for gabapentin as above  BPH (benign prostatic hyperplasia) Pt to restart proscar, hopefully to avoid further uti  Syncope Apparently due to low volume, tx with IVF and resolved,  to f/u any worsening symptoms or concerns  UTI (urinary tract infection) Asympt, to finish antibx,  to f/u any worsening symptoms or concerns  Followup: Return in about 6 months (around 06/17/2022).  Cathlean Cower, MD 12/15/2021 12:49 PM [facility]

## 2021-12-15 NOTE — Assessment & Plan Note (Signed)
Mild to mod, exam benign but wakes him up at night unable to sleep; for gabapentin 300 qhs prn, consider MRI for persistent or worsening symptoms

## 2021-12-15 NOTE — Assessment & Plan Note (Signed)
Asympt, to finish antibx,  to f/u any worsening symptoms or concerns

## 2022-01-08 ENCOUNTER — Other Ambulatory Visit: Payer: Self-pay

## 2022-01-08 ENCOUNTER — Emergency Department (HOSPITAL_BASED_OUTPATIENT_CLINIC_OR_DEPARTMENT_OTHER)
Admission: EM | Admit: 2022-01-08 | Discharge: 2022-01-08 | Disposition: A | Discharge status: Home / Self Care | Payer: Medicare Other | Attending: Emergency Medicine | Admitting: Emergency Medicine

## 2022-01-08 ENCOUNTER — Encounter (HOSPITAL_BASED_OUTPATIENT_CLINIC_OR_DEPARTMENT_OTHER): Payer: Self-pay

## 2022-01-08 ENCOUNTER — Other Ambulatory Visit (HOSPITAL_BASED_OUTPATIENT_CLINIC_OR_DEPARTMENT_OTHER): Payer: Self-pay

## 2022-01-08 DIAGNOSIS — M545 Low back pain, unspecified: Secondary | ICD-10-CM | POA: Diagnosis not present

## 2022-01-08 DIAGNOSIS — X501XXA Overexertion from prolonged static or awkward postures, initial encounter: Secondary | ICD-10-CM | POA: Insufficient documentation

## 2022-01-08 DIAGNOSIS — Y9389 Activity, other specified: Secondary | ICD-10-CM | POA: Diagnosis not present

## 2022-01-08 DIAGNOSIS — M5459 Other low back pain: Secondary | ICD-10-CM | POA: Diagnosis not present

## 2022-01-08 DIAGNOSIS — Y92812 Truck as the place of occurrence of the external cause: Secondary | ICD-10-CM | POA: Diagnosis not present

## 2022-01-08 MED ORDER — METHOCARBAMOL 500 MG PO TABS
500.0000 mg | ORAL_TABLET | Freq: Three times a day (TID) | ORAL | 0 refills | Status: DC | PRN
  Filled 2022-01-08: qty 8, 3d supply, fill #0

## 2022-01-08 NOTE — ED Notes (Signed)
Dc instructions reviewed with patient. Patient voiced understanding. Dc with belongings.  °

## 2022-01-08 NOTE — ED Triage Notes (Signed)
Patient here POV from Home.  Endorses Lower Intermittent Back pain that began 2 Weeks ago. 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   No Dysuria. Worse when Ambulating and in the AM.   NAD Noted during Triage. A&Ox4. GCS 15. Ambulatory.

## 2022-01-08 NOTE — ED Provider Notes (Signed)
[facility] EMERGENCY DEPT Provider Note   CSN: [id number] Arrival date & time: 01/08/22  1346     History  Chief Complaint  Patient presents with   Back Pain    Jack [name] is a 76 y.o. male.   Back Pain Patient presents with low back pain.  Began a week or 2 ago.  States that he had been working on his truck laying on the ground after it started.  States he changes out to started.  No injury.  No fall.  Since then has had tightness.  Worse with certain movements.  Worse when he tries to get up.  Advil does seem to help.  No loss of bladder bowel control.  No fevers.  In has had pains like this before.     Home Medications Prior to Admission medications   Medication Sig Start Date End Date Taking? Authorizing Provider  methocarbamol (ROBAXIN) 500 MG tablet Take 1 tablet (500 mg total) by mouth every 8 (eight) hours as needed for muscle spasms. 01/08/22  Yes Davonna Belling, MD  albuterol (VENTOLIN HFA) 108 (90 Base) MCG/ACT inhaler INHALE 1-2 PUFFS INTO THE LUNGS EVERY 6 (SIX) HOURS AS NEEDED FOR WHEEZING OR SHORTNESS OF BREATH. 07/11/20   Biagio Borg, MD  atenolol (TENORMIN) 25 MG tablet Take 1 tablet (25 mg total) by mouth daily. 12/06/17   Biagio Borg, MD  cetirizine (ZYRTEC) 10 MG tablet TAKE 1 TABLET BY MOUTH EVERY DAY 11/28/19   Biagio Borg, MD  citalopram (CELEXA) 10 MG tablet TAKE 1 TABLET BY MOUTH EVERY DAY 10/22/21   Biagio Borg, MD  clonazePAM (KLONOPIN) 0.5 MG tablet TAKE 1 TABLET BY MOUTH TWICE A DAY AS NEEDED FOR ANXIETY 07/06/17   Biagio Borg, MD  diphenhydrAMINE (BENADRYL) 25 MG tablet Take 1 tablet (25 mg total) by mouth every 6 (six) hours as needed. 05/26/21   Carlisle Cater, PA-C  famotidine (PEPCID) 20 MG tablet TAKE 1 TABLET BY MOUTH EVERYDAY AT BEDTIME 10/22/21   Biagio Borg, MD  fexofenadine (ALLEGRA) 180 MG tablet Take 1 tablet (180 mg total) by mouth daily. 07/31/21 07/31/22  Biagio Borg, MD  finasteride (PROSCAR) 5 MG tablet Take 1  tablet (5 mg total) by mouth daily. 12/06/17   Biagio Borg, MD  gabapentin (NEURONTIN) 300 MG capsule 1 tab by mouth at bedtime for pain and sleep as needed 12/15/21   Biagio Borg, MD  lactulose (CHRONULAC) 10 GM/15ML solution Take 45 mLs (30 g total) by mouth daily as needed for mild constipation. 08/18/19   Biagio Borg, MD  levocetirizine (XYZAL) 5 MG tablet Take 1 tablet (5 mg total) by mouth daily as needed for allergies. 06/01/16   Biagio Borg, MD  losartan-hydrochlorothiazide [facility]) 50-12.5 MG tablet TAKE 1 TABLET BY MOUTH EVERY DAY 10/22/21   Biagio Borg, MD  methimazole (TAPAZOLE) 5 MG tablet Take 1 tablet (5 mg total) by mouth 3 (three) times a week. 06/11/21   Renato Shin, MD  pantoprazole (PROTONIX) 40 MG tablet Take 1 tablet (40 mg total) by mouth 2 (two) times daily. 04/10/21   Biagio Borg, MD  polyethylene glycol powder [facility]) 17 GM/SCOOP powder Take 17 g by mouth daily. 08/18/19   Biagio Borg, MD  potassium chloride (KLOR-CON) 8 MEQ tablet Take 1 tablet (8 mEq total) by mouth daily. 01/24/21   Biagio Borg, MD  predniSONE (DELTASONE) 20 MG tablet Take 2 tablets (40 mg  total) by mouth daily. 05/26/21   Carlisle Cater, PA-C  rosuvastatin (CRESTOR) 40 MG tablet TAKE 1 TABLET BY MOUTH EVERY DAY 12/15/21   Biagio Borg, MD  simvastatin (ZOCOR) 40 MG tablet Take 1 tablet (40 mg total) by mouth daily. 09/24/20   Biagio Borg, MD  sucralfate (CARAFATE) 1 GM/10ML suspension Take 10 mLs (1 g total) by mouth 4 (four) times daily -  with meals and at bedtime. 01/18/21   Couture, Cortni S, PA-C  traMADol (ULTRAM) 50 MG tablet TAKE 1 TABLET BY MOUTH EVERY 6 HOURS AS NEEDED. 04/28/21   Biagio Borg, MD  triamcinolone (NASACORT) 55 MCG/ACT AERO nasal inhaler Place 2 sprays into the nose daily. 07/31/21   Biagio Borg, MD      Allergies    Patient has no known allergies.    Review of Systems   Review of Systems  Musculoskeletal:  Positive for back pain.    Physical  Exam Updated Vital Signs BP (!) 147/93 (BP Location: Right Arm)   Pulse 80   Temp 97.7 F (36.5 C)   Resp 20   Ht '5\' 6"'$  (1.676 m)   Wt 76.2 kg   SpO2 97%   BMI 27.11 kg/m  Physical Exam Vitals and nursing note reviewed.  Cardiovascular:     Rate and Rhythm: Regular rhythm.  Abdominal:     Tenderness: There is no abdominal tenderness.  Musculoskeletal:        General: Tenderness present.     Comments: Some tenderness over the right lumbar paraspinal musculature.  No rash.  No CVA tenderness.  Good straight leg raise bilaterally without pain.  Skin:    Capillary Refill: Capillary refill takes less than 2 seconds.  Neurological:     Mental Status: He is alert and oriented to person, place, and time.     ED Results / Procedures / Treatments   Labs (all labs ordered are listed, but only abnormal results are displayed) Labs Reviewed - No data to display  EKG None  Radiology No results found.  Procedures Procedures    Medications Ordered in ED Medications - No data to display  ED Course/ Medical Decision Making/ A&P                           Medical Decision Making Risk Prescription drug management.   Patient presents with low back pain.  Began after laying on the ground changing out this started on his truck.  States that worse with movements.  Relief with Advil.  Reviewed previous CT scan from 3 months ago did not show severe intra-abdominal pathology such as AAA which could cause some more worrisome pain.  No fevers.  Will give some muscle relaxers.  Symptomatic treatment and follow-up with PCP.  I think most likely muscular as opposed to bony issue.  Will discharge home.        Final Clinical Impression(s) / ED Diagnoses Final diagnoses:  Acute midline low back pain without sciatica    Rx / DC Orders ED Discharge Orders          Ordered    methocarbamol (ROBAXIN) 500 MG tablet  Every 8 hours PRN        01/08/22 1502              Davonna Belling, MD 01/08/22 1506

## 2022-01-19 ENCOUNTER — Ambulatory Visit (INDEPENDENT_AMBULATORY_CARE_PROVIDER_SITE_OTHER): Payer: Medicare Other | Admitting: Internal Medicine

## 2022-01-19 VITALS — BP 142/90 | HR 68 | Temp 97.9°F | Ht 66.0 in | Wt 172.0 lb

## 2022-01-19 DIAGNOSIS — R079 Chest pain, unspecified: Secondary | ICD-10-CM | POA: Diagnosis not present

## 2022-01-19 DIAGNOSIS — F419 Anxiety disorder, unspecified: Secondary | ICD-10-CM | POA: Diagnosis not present

## 2022-01-19 DIAGNOSIS — I499 Cardiac arrhythmia, unspecified: Secondary | ICD-10-CM | POA: Diagnosis not present

## 2022-01-19 DIAGNOSIS — I10 Essential (primary) hypertension: Secondary | ICD-10-CM

## 2022-01-19 DIAGNOSIS — F32A Depression, unspecified: Secondary | ICD-10-CM

## 2022-01-19 DIAGNOSIS — M5440 Lumbago with sciatica, unspecified side: Secondary | ICD-10-CM

## 2022-01-19 DIAGNOSIS — M545 Low back pain, unspecified: Secondary | ICD-10-CM | POA: Insufficient documentation

## 2022-01-19 MED ORDER — METHOCARBAMOL 500 MG PO TABS: 500.0000 mg | ORAL_TABLET | Freq: Three times a day (TID) | ORAL | 1 refills | Status: DC | PRN

## 2022-01-19 MED ORDER — CITALOPRAM HYDROBROMIDE 20 MG PO TABS: 20.0000 mg | ORAL_TABLET | Freq: Every day | ORAL | 3 refills | Status: DC

## 2022-01-19 NOTE — Progress Notes (Signed)
Patient ID: Jack [name], male   DOB: [birth year], 76 y.o.   MRN: [id number]        Chief Complaint: follow up post ED visit with low back pain       HPI:  Jack [name] is a 76 y.o. male here with above, c/w msk pain, much improved with robaxin, and asks for refill.  Did not require imaging or labs.  Did not take BP meds this am in trying to get her.  Has had mild persistent worsening depressive symptoms, but no suicidal ideation, or panic; despite taking the celexa 10 qd. [facility]  Pt denies chest pain, increased sob or doe, wheezing, orthopnea, PND, increased LE swelling, palpitations, dizziness or syncope.   Pt denies polydipsia, polyuria, or new focal neuro s/s.  Wt Readings from Last 3 Encounters:  01/19/22 172 lb (78 kg)  01/08/22 167 lb 15.9 oz (76.2 kg)  12/15/21 168 lb (76.2 kg)   BP Readings from Last 3 Encounters:  01/19/22 (!) 142/90  01/08/22 (!) 150/82  12/15/21 122/70         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    BACK PAIN 02/16/2007   COPD (chronic obstructive pulmonary disease) ([place])    Diverticulosis 09/27/2013   FOOT PAIN, CHRONIC 02/27/2008   GERD (gastroesophageal reflux disease)    Hepatitis C antibody test positive    HERPES ZOSTER, HX OF 02/27/2008   HYPERLIPIDEMIA 02/16/2007   HYPERTENSION 02/16/2007   Personal history of colonic polyps 03/08/2007   PROSTATE SPECIFIC ANTIGEN, ELEVATED 02/16/2007   Shortness of breath dyspnea    Past Surgical History:  Procedure Laterality Date   COLONOSCOPY     FOOT SURGERY     reconstruction x's 4 after work accident    reports that he quit smoking about 37 years ago. His smoking use included cigarettes. He has a 7.50 pack-year smoking history. He has never used smokeless tobacco. He reports that he does not drink alcohol and does not use drugs. family history includes Alcohol abuse in his brother; Cancer in his mother and sister; Coronary  artery disease in his father; Hypertension in his father. No Known Allergies Current Outpatient Medications on File Prior to Visit  Medication Sig Dispense Refill   albuterol (VENTOLIN HFA) 108 (90 Base) MCG/ACT inhaler INHALE 1-2 PUFFS INTO THE LUNGS EVERY 6 (SIX) HOURS AS NEEDED FOR WHEEZING OR SHORTNESS OF BREATH. 25.5 each 11   atenolol (TENORMIN) 25 MG tablet Take 1 tablet (25 mg total) by mouth daily. 90 tablet 3   cetirizine (ZYRTEC) 10 MG tablet TAKE 1 TABLET BY MOUTH EVERY DAY 90 tablet 3   clonazePAM (KLONOPIN) 0.5 MG tablet TAKE 1 TABLET BY MOUTH TWICE A DAY AS NEEDED FOR ANXIETY 60 tablet 2   diphenhydrAMINE (BENADRYL) 25 MG tablet Take 1 tablet (25 mg total) by mouth every 6 (six) hours as needed. 20 tablet 0   famotidine (PEPCID) 20 MG tablet TAKE 1 TABLET BY MOUTH EVERYDAY AT BEDTIME 90 tablet 3   fexofenadine (ALLEGRA) 180 MG tablet Take 1 tablet (180 mg total) by mouth daily. 30 tablet 2   finasteride (PROSCAR) 5 MG tablet Take 1 tablet (5 mg total) by mouth daily. 90 tablet 3   gabapentin (NEURONTIN) 300 MG capsule 1 tab by mouth at bedtime for pain and sleep as needed 90 capsule 1   lactulose (CHRONULAC) 10 GM/15ML solution  Take 45 mLs (30 g total) by mouth daily as needed for mild constipation. 236 mL 1   levocetirizine (XYZAL) 5 MG tablet Take 1 tablet (5 mg total) by mouth daily as needed for allergies. 90 tablet 1   losartan-hydrochlorothiazide (HYZAAR) 50-12.5 MG tablet TAKE 1 TABLET BY MOUTH EVERY DAY 90 tablet 3   methimazole (TAPAZOLE) 5 MG tablet Take 1 tablet (5 mg total) by mouth 3 (three) times a week. 36 tablet 3   pantoprazole (PROTONIX) 40 MG tablet Take 1 tablet (40 mg total) by mouth 2 (two) times daily. 180 tablet 3   polyethylene glycol powder (GLYCOLAX/MIRALAX) 17 GM/SCOOP powder Take 17 g by mouth daily. 3350 g 1   potassium chloride (KLOR-CON) 8 MEQ tablet Take 1 tablet (8 mEq total) by mouth daily. 90 tablet 3   predniSONE (DELTASONE) 20 MG tablet Take 2  tablets (40 mg total) by mouth daily. 8 tablet 0   rosuvastatin (CRESTOR) 40 MG tablet TAKE 1 TABLET BY MOUTH EVERY DAY 90 tablet 3   simvastatin (ZOCOR) 40 MG tablet Take 1 tablet (40 mg total) by mouth daily. 90 tablet 1   sucralfate (CARAFATE) 1 GM/10ML suspension Take 10 mLs (1 g total) by mouth 4 (four) times daily -  with meals and at bedtime. 420 mL 0   traMADol (ULTRAM) 50 MG tablet TAKE 1 TABLET BY MOUTH EVERY 6 HOURS AS NEEDED. 60 tablet 1   triamcinolone (NASACORT) 55 MCG/ACT AERO nasal inhaler Place 2 sprays into the nose daily. 3 each 3   No current facility-administered medications on file prior to visit.        ROS:  All others reviewed and negative.  Objective        PE:  BP (!) 142/90 (BP Location: Right Arm, Patient Position: Sitting, Cuff Size: Large)   Pulse 68   Temp 97.9 F (36.6 C) (Oral)   Ht '5\' 6"'$  (1.676 m)   Wt 172 lb (78 kg)   SpO2 94%   BMI 27.76 kg/m                 Constitutional: Pt appears in NAD               HENT: Head: NCAT.                Right Ear: External ear normal.                 Left Ear: External ear normal.                Eyes: . Pupils are equal, round, and reactive to light. Conjunctivae and EOM are normal               Nose: without d/c or deformity               Neck: Neck supple. Gross normal ROM               Cardiovascular: Normal rate and regular rhythm with ectopy                Pulmonary/Chest: Effort normal and breath sounds without rales or wheezing.                Abd:  Soft, NT, ND, + BS, no organomegaly               Neurological: Pt is alert. At baseline orientation, motor grossly intact; spine nontender in midline  Skin: Skin is warm. No rashes, no other new lesions, LE edema - none               Psychiatric: Pt behavior is normal without agitation, depressed affect   Micro: none  Cardiac tracings I have personally interpreted today:  SR with PACs, 69  Pertinent Radiological findings (summarize): none    Lab Results  Component Value Date   WBC 14.5 (H) 12/09/2021   HGB 14.8 12/09/2021   HCT 43.3 12/09/2021   PLT 174 12/09/2021   GLUCOSE 105 (H) 12/09/2021   CHOL 160 04/30/2021   TRIG 117.0 04/30/2021   HDL 63.00 04/30/2021   LDLDIRECT 89.0 12/06/2017   LDLCALC 74 04/30/2021   ALT 13 09/25/2021   AST 19 09/25/2021   NA 141 12/09/2021   K 3.6 12/09/2021   CL 110 12/09/2021   CREATININE 1.46 (H) 12/09/2021   BUN 22 12/09/2021   CO2 23 12/09/2021   TSH 0.57 04/30/2021   PSA 2.79 04/30/2021   HGBA1C 5.9 04/30/2021   Assessment/Plan:  Jack [name] is a 76 y.o. Black or African American [2] male with  has a past medical history of ALLERGIC RHINITIS (02/16/2007), Arthritis (2013), BACK PAIN (02/16/2007), COPD (chronic obstructive pulmonary disease) ([place]), Diverticulosis (09/27/2013), FOOT PAIN, CHRONIC (02/27/2008), GERD (gastroesophageal reflux disease), Hepatitis C antibody test positive, HERPES ZOSTER, HX OF (02/27/2008), HYPERLIPIDEMIA (02/16/2007), HYPERTENSION (02/16/2007), Personal history of colonic polyps (03/08/2007), PROSTATE SPECIFIC ANTIGEN, ELEVATED (02/16/2007), and Shortness of breath dyspnea.  Anxiety and depression Mild persistent, for increased celexa 20 mg qd, declines need for counseling  Essential hypertension BP Readings from Last 3 Encounters:  01/19/22 (!) 142/90  01/08/22 (!) 150/82  12/15/21 122/70   Uncontrolled but did not take BP med today, o/w states good compliance, pt to continue medical treatment  - hyzaar 50-12.5 qd, tenormin 25 qd    Irregular heart beats ECG reviewed - no evidence for afib, most likely due to PACs noted  Low back pain Improved, exam benign, for refill robaxin prn  Followup: No follow-ups on file.  Cathlean Cower, MD 01/19/2022 9:02 PM [facility]

## 2022-01-19 NOTE — Assessment & Plan Note (Signed)
BP Readings from Last 3 Encounters:  01/19/22 (!) 142/90  01/08/22 (!) 150/82  12/15/21 122/70   Uncontrolled but did not take BP med today, o/w states good compliance, pt to continue medical treatment  - hyzaar 50-12.5 qd, tenormin 25 qd

## 2022-01-19 NOTE — Patient Instructions (Signed)
Your EKG was done today  [facility] for the refill of the muscle relaxer for your back pain  Ok to increase the citalopram to 20 mg per day for low mood  Please continue all other medications as before, and refills have been done if requested.  Please have the pharmacy call with any other refills you may need.  Please continue your efforts at being more active, low cholesterol diet, and weight control.  Please keep your appointments with your specialists as you may have planned  Please make an Appointment to return in 4 months, or sooner if needed, also with Lab Appointment for testing done 3-5 days before at the [facility] (so this is for TWO appointments - please see the scheduling desk as you leave)

## 2022-01-19 NOTE — Assessment & Plan Note (Signed)
Improved, exam benign, for refill robaxin prn

## 2022-01-19 NOTE — Assessment & Plan Note (Signed)
Mild persistent, for increased celexa 20 mg qd, declines need for counseling

## 2022-01-19 NOTE — Assessment & Plan Note (Signed)
ECG reviewed - no evidence for afib, most likely due to PACs noted

## 2022-01-25 ENCOUNTER — Other Ambulatory Visit: Payer: Self-pay | Admitting: Internal Medicine

## 2022-01-25 NOTE — Telephone Encounter (Signed)
Please refill as per office routine med refill policy (all routine meds to be refilled for 3 mo or monthly (per pt preference) up to one year from last visit, then month to month grace period for 3 mo, then further med refills will have to be denied) ? ?

## 2022-02-16 ENCOUNTER — Telehealth: Payer: Self-pay | Admitting: Internal Medicine

## 2022-02-16 NOTE — Telephone Encounter (Signed)
Patient needs a refill on his potassium chloride - [facility] [address] Visit:  01/17/2022  Next Visit:  Patient did not want to schedule at the present time

## 2022-02-17 NOTE — Telephone Encounter (Signed)
Left message for a call back regarding Rx request, patient has requested refill too soon.

## 2022-05-13 ENCOUNTER — Other Ambulatory Visit: Payer: Self-pay

## 2022-05-13 MED ORDER — METHIMAZOLE 5 MG PO TABS: 5.0000 mg | ORAL_TABLET | ORAL | 0 refills | Status: AC

## 2022-05-21 ENCOUNTER — Ambulatory Visit: Payer: Medicare Other | Admitting: Internal Medicine

## 2022-05-25 DIAGNOSIS — H524 Presbyopia: Secondary | ICD-10-CM | POA: Diagnosis not present

## 2022-05-25 DIAGNOSIS — H40013 Open angle with borderline findings, low risk, bilateral: Secondary | ICD-10-CM | POA: Diagnosis not present

## 2022-05-25 DIAGNOSIS — H52223 Regular astigmatism, bilateral: Secondary | ICD-10-CM | POA: Diagnosis not present

## 2022-05-25 DIAGNOSIS — H25813 Combined forms of age-related cataract, bilateral: Secondary | ICD-10-CM | POA: Diagnosis not present

## 2022-05-28 ENCOUNTER — Ambulatory Visit: Payer: Medicare Other | Admitting: Internal Medicine

## 2022-06-05 ENCOUNTER — Other Ambulatory Visit: Payer: Medicare Other

## 2022-06-05 NOTE — Progress Notes (Signed)
Patient appearing on report for [employer] [facility] - Hypertension Control report due to last documented ambulatory blood pressure of 142/90 on 01/19/22. Next appointment with PCP is 06/08/22.   Outreached patient to discuss hypertension control and medication management. Spoke with patient and wife, but both were poor historians and unable to provide full list of current medications.  Also state that Jack [name] checks BP at home but could not provide and readings Of note, reading on 9/25 office visit patient endorsed not taking BP meds, and previous elevation documented was during ED visit for back pain.  Current antihypertensives: Atenolol 35m and losartan/hctz 50/12.570mdaily  Assessment/Plan: - Educated patient to bring all current medication bottles/packaging to 2/12 office appointment to clarify medication list.  Also asked to check home bp daily between now and then and bring those numbers. Can schedule in person pharmacy visit if needed to assist. - Patient and wife endorse accessibility, affordability and good adherence in regard to medications.  I do have concern with adherence since last OV patient had not taken BP medication, and they could not verify current med list with me today. - Hesitant to encourage med changes at this time  ChDarlina GuysPharmD, DPLA

## 2022-06-08 ENCOUNTER — Encounter: Payer: Self-pay | Admitting: Internal Medicine

## 2022-06-08 ENCOUNTER — Ambulatory Visit (INDEPENDENT_AMBULATORY_CARE_PROVIDER_SITE_OTHER): Payer: Medicare Other | Admitting: Internal Medicine

## 2022-06-08 VITALS — BP 130/86 | HR 67 | Temp 97.7°F | Ht 66.0 in | Wt 170.0 lb

## 2022-06-08 DIAGNOSIS — R739 Hyperglycemia, unspecified: Secondary | ICD-10-CM

## 2022-06-08 DIAGNOSIS — J309 Allergic rhinitis, unspecified: Secondary | ICD-10-CM | POA: Diagnosis not present

## 2022-06-08 DIAGNOSIS — Z0001 Encounter for general adult medical examination with abnormal findings: Secondary | ICD-10-CM | POA: Diagnosis not present

## 2022-06-08 DIAGNOSIS — E7800 Pure hypercholesterolemia, unspecified: Secondary | ICD-10-CM | POA: Diagnosis not present

## 2022-06-08 DIAGNOSIS — I10 Essential (primary) hypertension: Secondary | ICD-10-CM

## 2022-06-08 DIAGNOSIS — I700 Atherosclerosis of aorta: Secondary | ICD-10-CM | POA: Diagnosis not present

## 2022-06-08 DIAGNOSIS — E538 Deficiency of other specified B group vitamins: Secondary | ICD-10-CM

## 2022-06-08 DIAGNOSIS — E559 Vitamin D deficiency, unspecified: Secondary | ICD-10-CM | POA: Diagnosis not present

## 2022-06-08 LAB — CBC WITH DIFFERENTIAL/PLATELET
Basophils Absolute: 0 10*3/uL (ref 0.0–0.1)
Basophils Relative: 0.5 % (ref 0.0–3.0)
Eosinophils Absolute: 0 10*3/uL (ref 0.0–0.7)
Eosinophils Relative: 0.2 % (ref 0.0–5.0)
HCT: 40.6 % (ref 39.0–52.0)
Hemoglobin: 14.1 g/dL (ref 13.0–17.0)
Lymphocytes Relative: 16.5 % (ref 12.0–46.0)
Lymphs Abs: 1.4 10*3/uL (ref 0.7–4.0)
MCHC: 34.6 g/dL (ref 30.0–36.0)
MCV: 95.7 fl (ref 78.0–100.0)
Monocytes Absolute: 0.7 10*3/uL (ref 0.1–1.0)
Monocytes Relative: 7.6 % (ref 3.0–12.0)
Neutro Abs: 6.5 10*3/uL (ref 1.4–7.7)
Neutrophils Relative %: 75.2 % (ref 43.0–77.0)
Platelets: 179 10*3/uL (ref 150.0–400.0)
RBC: 4.24 Mil/uL (ref 4.22–5.81)
RDW: 12.5 % (ref 11.5–15.5)
WBC: 8.6 10*3/uL (ref 4.0–10.5)

## 2022-06-08 LAB — URINALYSIS, ROUTINE W REFLEX MICROSCOPIC
Bilirubin Urine: NEGATIVE
Hgb urine dipstick: NEGATIVE
Ketones, ur: NEGATIVE
Leukocytes,Ua: NEGATIVE
Nitrite: NEGATIVE
RBC / HPF: NONE SEEN (ref 0–?)
Specific Gravity, Urine: >=1.03 — AB (ref 1.000–1.030)
Total Protein, Urine: NEGATIVE
Urine Glucose: NEGATIVE
Urobilinogen, UA: 1 (ref 0.0–1.0)
pH: 6 (ref 5.0–8.0)

## 2022-06-08 LAB — LIPID PANEL
Cholesterol: 168 mg/dL (ref 0–200)
HDL: 66.3 mg/dL
LDL Cholesterol: 84 mg/dL (ref 0–99)
NonHDL: 101.52
Total CHOL/HDL Ratio: 3
Triglycerides: 86 mg/dL (ref 0.0–149.0)
VLDL: 17.2 mg/dL (ref 0.0–40.0)

## 2022-06-08 LAB — BASIC METABOLIC PANEL WITH GFR
BUN: 13 mg/dL (ref 6–23)
CO2: 28 meq/L (ref 19–32)
Calcium: 10.3 mg/dL (ref 8.4–10.5)
Chloride: 104 meq/L (ref 96–112)
Creatinine, Ser: 1.18 mg/dL (ref 0.40–1.50)
GFR: 59.85 mL/min — ABNORMAL LOW
Glucose, Bld: 108 mg/dL — ABNORMAL HIGH (ref 70–99)
Potassium: 4.4 meq/L (ref 3.5–5.1)
Sodium: 139 meq/L (ref 135–145)

## 2022-06-08 LAB — HEPATIC FUNCTION PANEL
ALT: 16 U/L (ref 0–53)
AST: 24 U/L (ref 0–37)
Albumin: 4.3 g/dL (ref 3.5–5.2)
Alkaline Phosphatase: 68 U/L (ref 39–117)
Bilirubin, Direct: 0.1 mg/dL (ref 0.0–0.3)
Total Bilirubin: 0.6 mg/dL (ref 0.2–1.2)
Total Protein: 7.6 g/dL (ref 6.0–8.3)

## 2022-06-08 LAB — TSH: TSH: 0.48 u[IU]/mL (ref 0.35–5.50)

## 2022-06-08 LAB — MICROALBUMIN / CREATININE URINE RATIO
Creatinine,U: 200.1 mg/dL
Microalb Creat Ratio: 0.9 mg/g (ref 0.0–30.0)
Microalb, Ur: 1.7 mg/dL (ref 0.0–1.9)

## 2022-06-08 LAB — HEMOGLOBIN A1C: Hgb A1c MFr Bld: 5.9 % (ref 4.6–6.5)

## 2022-06-08 LAB — VITAMIN B12: Vitamin B-12: 958 pg/mL — ABNORMAL HIGH (ref 211–911)

## 2022-06-08 LAB — VITAMIN D 25 HYDROXY (VIT D DEFICIENCY, FRACTURES): VITD: 28.09 ng/mL — ABNORMAL LOW (ref 30.00–100.00)

## 2022-06-08 MED ORDER — METHYLPREDNISOLONE ACETATE 80 MG/ML IJ SUSP
80.0000 mg | Freq: Once | INTRAMUSCULAR | Status: AC
  Administered 2022-06-08: 80 mg via INTRAMUSCULAR

## 2022-06-08 NOTE — Assessment & Plan Note (Signed)
Lab Results  Component Value Date   LDLCALC 74 04/30/2021   Uncontrolled, goal ldl < 70,, pt to continue current statin crestor 40 mg and low chol diet, declines add zetia for now

## 2022-06-08 NOTE — Assessment & Plan Note (Signed)
Mild to mod, for depomedrol IM 80 mg , also add nasacort otc prn,  to f/u any worsening symptoms or concerns

## 2022-06-08 NOTE — Addendum Note (Signed)
Addended by: Max Sane on: 06/08/2022 09:26 AM   Modules accepted: Orders

## 2022-06-08 NOTE — Assessment & Plan Note (Signed)
Age and sex appropriate education and counseling updated with regular exercise and diet Referrals for preventative services - for f/u right hearing aid otc,  Immunizations addressed - for shingrix at pharmacy, declines covid booster Smoking counseling  - none needed Evidence for depression or other mood disorder - none significant Most recent labs reviewed. I have personally reviewed and have noted: 1) the patient's medical and social history 2) The patient's current medications and supplements 3) The patient's height, weight, and BMI have been recorded in the chart

## 2022-06-08 NOTE — Patient Instructions (Addendum)
Please have your Shingrix (shingles) shots done at your local pharmacy.  You had the steroid shot today   Please continue all other medications as before, and refills have been done if requested.  Please have the pharmacy call with any other refills you may need.  Please continue your efforts at being more active, low cholesterol diet, and weight control.  You are otherwise up to date with prevention measures today.  Please keep your appointments with your specialists as you may have planned  Please go to the LAB at the blood drawing area for the tests to be done  You will be contacted by phone if any changes need to be made immediately.  Otherwise, you will receive a letter about your results with an explanation, but please check with MyChart first.  Please remember to sign up for MyChart if you have not done so, as this will be important to you in the future with finding out test results, communicating by private email, and scheduling acute appointments online when needed.  Please make an Appointment to return in 6 months, or sooner if needed

## 2022-06-08 NOTE — Assessment & Plan Note (Signed)
Ok to continue crestor 40 mg qd, exercise, low chol diet

## 2022-06-08 NOTE — Assessment & Plan Note (Signed)
Lab Results  Component Value Date   HGBA1C 5.9 04/30/2021   Stable, pt to continue current medical treatment  - diet, wt control, for f/u lab today

## 2022-06-08 NOTE — Progress Notes (Addendum)
Patient ID: Jack [name], male   DOB: [birth year], 77 y.o.   MRN: [id number]         Chief Complaint:: wellness exam and allergies, htn, hld, hyperglycemia, aortic atherosclerosis       HPI:  Jack [name] is a 77 y.o. male here for wellness exam; has right hearing aid in but still hearing less than optimal, plans to consider otc hearing aid change.; may consider shingrix at the pharmacy, declines covid booster o/w up to date                        Also has seen optho recently, for f/u right cataract removal in June 2024.  Does have several wks ongoing nasal allergy symptoms with clearish congestion, itch and sneezing, without fever, pain, ST, cough, swelling or wheezing.  Pt denies chest pain, increased sob or doe, wheezing, orthopnea, PND, increased LE swelling, palpitations, dizziness or syncope.   Pt denies polydipsia, polyuria, or new focal neuro s/s.    Pt denies fever, wt loss, night sweats, loss of appetite, or other constitutional symptoms     Wt Readings from Last 3 Encounters:  06/08/22 170 lb (77.1 kg)  01/19/22 172 lb (78 kg)  01/08/22 167 lb 15.9 oz (76.2 kg)   BP Readings from Last 3 Encounters:  06/08/22 130/86  01/19/22 (!) 142/90  01/08/22 (!) 150/82   Immunization History  Administered Date(s) Administered   Fluad Quad(high Dose 65+) 03/29/2019, 01/26/2020, 01/24/2021   Influenza Whole 02/16/2007, 02/27/2008   Influenza, High Dose Seasonal PF 03/09/2016, 02/26/2018, 06/29/2018   Influenza,inj,Quad PF,6+ Mos 05/17/2014   Influenza-Unspecified 01/12/2022   PFIZER(Purple Top)SARS-COV-2 Vaccination 06/12/2019, 07/03/2019, 02/10/2020   Pneumococcal Conjugate-13 09/27/2013   Pneumococcal Polysaccharide-23 02/27/2008, 10/04/2014   Rotavirus,unspecified  12/27/2021   Td 02/16/2007   Tdap 12/03/2016  There are no preventive care reminders to display for this patient.    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     BACK PAIN 02/16/2007   COPD (chronic obstructive pulmonary disease) ([place])    Diverticulosis 09/27/2013   FOOT PAIN, CHRONIC 02/27/2008   GERD (gastroesophageal reflux disease)    Hepatitis C antibody test positive    HERPES ZOSTER, HX OF 02/27/2008   HYPERLIPIDEMIA 02/16/2007   HYPERTENSION 02/16/2007   Personal history of colonic polyps 03/08/2007   PROSTATE SPECIFIC ANTIGEN, ELEVATED 02/16/2007   Shortness of breath dyspnea    Past Surgical History:  Procedure Laterality Date   COLONOSCOPY     FOOT SURGERY     reconstruction x's 4 after work accident    reports that he quit smoking about 38 years ago. His smoking use included cigarettes. He has a 7.50 pack-year smoking history. He has never used smokeless tobacco. He reports that he does not drink alcohol and does not use drugs. family history includes Alcohol abuse in his brother; Cancer in his mother and sister; Coronary artery disease in his father; Hypertension in his father. No Known Allergies Current Outpatient Medications on File Prior to Visit  Medication Sig Dispense Refill   albuterol (VENTOLIN HFA) 108 (90 Base) MCG/ACT inhaler INHALE 1-2 PUFFS INTO THE LUNGS EVERY 6 (SIX) HOURS AS NEEDED FOR WHEEZING OR SHORTNESS OF BREATH. 25.5 each 11   atenolol (TENORMIN) 25 MG tablet Take 1 tablet (25 mg total) by mouth daily. 90 tablet 3   cetirizine (ZYRTEC) 10 MG tablet TAKE 1 TABLET BY MOUTH  EVERY DAY 90 tablet 3   citalopram (CELEXA) 20 MG tablet Take 1 tablet (20 mg total) by mouth daily. 90 tablet 3   clonazePAM (KLONOPIN) 0.5 MG tablet TAKE 1 TABLET BY MOUTH TWICE A DAY AS NEEDED FOR ANXIETY 60 tablet 2   diphenhydrAMINE (BENADRYL) 25 MG tablet Take 1 tablet (25 mg total) by mouth every 6 (six) hours as needed. 20 tablet 0   famotidine (PEPCID) 20 MG tablet TAKE 1 TABLET BY MOUTH EVERYDAY AT BEDTIME 90 tablet 3   fexofenadine (ALLEGRA) 180 MG tablet Take 1 tablet (180 mg total) by mouth daily. 30 tablet 2   finasteride  (PROSCAR) 5 MG tablet Take 1 tablet (5 mg total) by mouth daily. 90 tablet 3   gabapentin (NEURONTIN) 300 MG capsule 1 tab by mouth at bedtime for pain and sleep as needed 90 capsule 1   lactulose (CHRONULAC) 10 GM/15ML solution Take 45 mLs (30 g total) by mouth daily as needed for mild constipation. 236 mL 1   levocetirizine (XYZAL) 5 MG tablet Take 1 tablet (5 mg total) by mouth daily as needed for allergies. 90 tablet 1   losartan-hydrochlorothiazide (HYZAAR) 50-12.5 MG tablet TAKE 1 TABLET BY MOUTH EVERY DAY 90 tablet 3   methimazole (TAPAZOLE) 5 MG tablet Take 1 tablet (5 mg total) by mouth 3 (three) times a week. 36 tablet 0   methocarbamol (ROBAXIN) 500 MG tablet Take 1 tablet (500 mg total) by mouth every 8 (eight) hours as needed for muscle spasms. 40 tablet 1   pantoprazole (PROTONIX) 40 MG tablet Take 1 tablet (40 mg total) by mouth 2 (two) times daily. 180 tablet 3   polyethylene glycol powder (GLYCOLAX/MIRALAX) 17 GM/SCOOP powder Take 17 g by mouth daily. 3350 g 1   potassium chloride (KLOR-CON) 8 MEQ tablet TAKE 1 TABLET BY MOUTH EVERY DAY 90 tablet 3   predniSONE (DELTASONE) 20 MG tablet Take 2 tablets (40 mg total) by mouth daily. 8 tablet 0   rosuvastatin (CRESTOR) 40 MG tablet TAKE 1 TABLET BY MOUTH EVERY DAY 90 tablet 3   sucralfate (CARAFATE) 1 GM/10ML suspension Take 10 mLs (1 g total) by mouth 4 (four) times daily -  with meals and at bedtime. 420 mL 0   traMADol (ULTRAM) 50 MG tablet TAKE 1 TABLET BY MOUTH EVERY 6 HOURS AS NEEDED. 60 tablet 1   triamcinolone (NASACORT) 55 MCG/ACT AERO nasal inhaler Place 2 sprays into the nose daily. 3 each 3   No current facility-administered medications on file prior to visit.        ROS:  All others reviewed and negative.  Objective        PE:  BP 130/86 (BP Location: Right Arm, Patient Position: Sitting, Cuff Size: Large)   Pulse 67   Temp 97.7 F (36.5 C) (Oral)   Ht 5' 6"$  (1.676 m)   Wt 170 lb (77.1 kg)   SpO2 97%   BMI 27.44  kg/m                 Constitutional: Pt appears in NAD               HENT: Head: NCAT.                Right Ear: External ear normal.                 Left Ear: External ear normal. Bilat tm's with mild erythema.  Max sinus areas non tender.  Pharynx with mild erythema, no exudate               Eyes: . Pupils are equal, round, and reactive to light. Conjunctivae and EOM are normal               Nose: without d/c or deformity               Neck: Neck supple. Gross normal ROM               Cardiovascular: Normal rate and regular rhythm.                 Pulmonary/Chest: Effort normal and breath sounds without rales or wheezing.                Abd:  Soft, NT, ND, + BS, no organomegaly               Neurological: Pt is alert. At baseline orientation, motor grossly intact               Skin: Skin is warm. No rashes, no other new lesions, LE edema - =none               Psychiatric: Pt behavior is normal without agitation   Micro: none  Cardiac tracings I have personally interpreted today:  none  Pertinent Radiological findings (summarize): none   Lab Results  Component Value Date   WBC 14.5 (H) 12/09/2021   HGB 14.8 12/09/2021   HCT 43.3 12/09/2021   PLT 174 12/09/2021   GLUCOSE 105 (H) 12/09/2021   CHOL 160 04/30/2021   TRIG 117.0 04/30/2021   HDL 63.00 04/30/2021   LDLDIRECT 89.0 12/06/2017   LDLCALC 74 04/30/2021   ALT 13 09/25/2021   AST 19 09/25/2021   NA 141 12/09/2021   K 3.6 12/09/2021   CL 110 12/09/2021   CREATININE 1.46 (H) 12/09/2021   BUN 22 12/09/2021   CO2 23 12/09/2021   TSH 0.57 04/30/2021   PSA 2.79 04/30/2021   HGBA1C 5.9 04/30/2021   Assessment/Plan:  Jack [name] is a 77 y.o. Black or African American [2] male with  has a past medical history of ALLERGIC RHINITIS (02/16/2007), Arthritis (2013), BACK PAIN (02/16/2007), COPD (chronic obstructive pulmonary disease) ([place]), Diverticulosis (09/27/2013), FOOT PAIN, CHRONIC (02/27/2008), GERD (gastroesophageal  reflux disease), Hepatitis C antibody test positive, HERPES ZOSTER, HX OF (02/27/2008), HYPERLIPIDEMIA (02/16/2007), HYPERTENSION (02/16/2007), Personal history of colonic polyps (03/08/2007), PROSTATE SPECIFIC ANTIGEN, ELEVATED (02/16/2007), and Shortness of breath dyspnea.  Encounter for well adult exam with abnormal findings Age and sex appropriate education and counseling updated with regular exercise and diet Referrals for preventative services - for f/u right hearing aid otc,  Immunizations addressed - for shingrix at pharmacy, declines covid booster Smoking counseling  - none needed Evidence for depression or other mood disorder - none significant Most recent labs reviewed. I have personally reviewed and have noted: 1) the patient's medical and social history 2) The patient's current medications and supplements 3) The patient's height, weight, and BMI have been recorded in the chart   Allergic rhinitis Mild to mod, for depomedrol IM 80 mg , also add nasacort otc prn,  to f/u any worsening symptoms or concerns  Aortic atherosclerosis ([place]) Ok to continue crestor 40 mg qd, exercise, low chol diet  Essential hypertension BP Readings from Last 3 Encounters:  06/08/22 130/86  01/19/22 (!) 142/90  01/08/22 (!) 150/82   Stable, pt to continue medical treatment  tenormin 25 mg qd, hyzaar 50-12.5 qd   Hyperglycemia Lab Results  Component Value Date   HGBA1C 5.9 04/30/2021   Stable, pt to continue current medical treatment  - diet, wt control, for f/u lab today   Hyperlipidemia Lab Results  Component Value Date   [place] 74 04/30/2021   Uncontrolled, goal ldl < 70,, pt to continue current statin crestor 40 mg and low chol diet, declines add zetia for now  Followup: Return in about 6 months (around 12/07/2022).  Cathlean Cower, MD 06/08/2022 9:22 AM [facility]

## 2022-06-08 NOTE — Assessment & Plan Note (Signed)
BP Readings from Last 3 Encounters:  06/08/22 130/86  01/19/22 (!) 142/90  01/08/22 (!) 150/82   Stable, pt to continue medical treatment tenormin 25 mg qd, hyzaar 50-12.5 qd

## 2022-07-07 ENCOUNTER — Telehealth: Payer: Self-pay

## 2022-07-07 NOTE — Telephone Encounter (Signed)
Called patient to schedule Medicare Annual Wellness Visit (AWV). Unable to reach patient.  Last date of AWV: 06/29/18  Please schedule an appointment at any time with [facility].   Norton Blizzard, [place] ([facility])  [facility] [phone]

## 2022-08-09 ENCOUNTER — Other Ambulatory Visit: Payer: Self-pay | Admitting: Internal Medicine

## 2022-10-23 ENCOUNTER — Other Ambulatory Visit: Payer: Self-pay

## 2022-10-23 ENCOUNTER — Other Ambulatory Visit: Payer: Self-pay | Admitting: Internal Medicine

## 2022-11-15 ENCOUNTER — Emergency Department (HOSPITAL_BASED_OUTPATIENT_CLINIC_OR_DEPARTMENT_OTHER): Payer: Medicare Other

## 2022-11-15 ENCOUNTER — Emergency Department (HOSPITAL_BASED_OUTPATIENT_CLINIC_OR_DEPARTMENT_OTHER)
Admission: EM | Admit: 2022-11-15 | Discharge: 2022-11-15 | Disposition: A | Discharge status: Home / Self Care | Payer: Medicare Other | Attending: Emergency Medicine | Admitting: Emergency Medicine

## 2022-11-15 ENCOUNTER — Encounter (HOSPITAL_BASED_OUTPATIENT_CLINIC_OR_DEPARTMENT_OTHER): Payer: Self-pay

## 2022-11-15 DIAGNOSIS — R109 Unspecified abdominal pain: Secondary | ICD-10-CM | POA: Diagnosis present

## 2022-11-15 DIAGNOSIS — K5900 Constipation, unspecified: Secondary | ICD-10-CM | POA: Diagnosis not present

## 2022-11-15 DIAGNOSIS — N3289 Other specified disorders of bladder: Secondary | ICD-10-CM | POA: Diagnosis not present

## 2022-11-15 DIAGNOSIS — K5730 Diverticulosis of large intestine without perforation or abscess without bleeding: Secondary | ICD-10-CM | POA: Diagnosis not present

## 2022-11-15 LAB — COMPREHENSIVE METABOLIC PANEL
ALT: 10 U/L (ref 0–44)
AST: 19 U/L (ref 15–41)
Albumin: 4.4 g/dL (ref 3.5–5.0)
Alkaline Phosphatase: 60 U/L (ref 38–126)
Anion gap: 8 (ref 5–15)
BUN: 14 mg/dL (ref 8–23)
CO2: 25 mmol/L (ref 22–32)
Calcium: 9.8 mg/dL (ref 8.9–10.3)
Chloride: 107 mmol/L (ref 98–111)
Creatinine, Ser: 1.05 mg/dL (ref 0.61–1.24)
GFR, Estimated: >60 mL/min (ref >60)
Glucose, Bld: 116 mg/dL — ABNORMAL HIGH (ref 70–99)
Potassium: 3.3 mmol/L — ABNORMAL LOW (ref 3.5–5.1)
Sodium: 140 mmol/L (ref 135–145)
Total Bilirubin: 0.8 mg/dL (ref 0.3–1.2)
Total Protein: 7.6 g/dL (ref 6.5–8.1)

## 2022-11-15 LAB — CBC
HCT: 39.4 % (ref 39.0–52.0)
Hemoglobin: 14 g/dL (ref 13.0–17.0)
MCH: 33.1 pg (ref 26.0–34.0)
MCHC: 35.5 g/dL (ref 30.0–36.0)
MCV: 93.1 fL (ref 80.0–100.0)
Platelets: 171 10*3/uL (ref 150–400)
RBC: 4.23 MIL/uL (ref 4.22–5.81)
RDW: 12.3 % (ref 11.5–15.5)
WBC: 7.6 10*3/uL (ref 4.0–10.5)
nRBC: 0 % (ref 0.0–0.2)

## 2022-11-15 LAB — URINALYSIS, ROUTINE W REFLEX MICROSCOPIC
Bacteria, UA: NONE SEEN
Bilirubin Urine: NEGATIVE
Glucose, UA: NEGATIVE mg/dL
Hgb urine dipstick: NEGATIVE
Ketones, ur: NEGATIVE mg/dL
Leukocytes,Ua: NEGATIVE
Nitrite: NEGATIVE
Protein, ur: 30 mg/dL — AB
Specific Gravity, Urine: 1.032 — ABNORMAL HIGH (ref 1.005–1.030)
pH: 6 (ref 5.0–8.0)

## 2022-11-15 LAB — LIPASE, BLOOD: Lipase: 29 U/L (ref 11–51)

## 2022-11-15 MED ORDER — IOHEXOL 300 MG/ML  SOLN
100.0000 mL | Freq: Once | INTRAMUSCULAR | Status: AC | PRN
  Administered 2022-11-15: 100 mL via INTRAVENOUS

## 2022-11-15 MED ORDER — FLEET ENEMA 7-19 GM/118ML RE ENEM: 1.0000 | ENEMA | Freq: Once | RECTAL | 0 refills | Status: AC

## 2022-11-15 NOTE — ED Triage Notes (Signed)
Patient here POV from Home.  Endorses Bloating and Gas to ABD for about a week. No Fever. No Pain. No N/V/D. Some Constipation.   NAD Noted during Triage. A&Ox4. GCS 15. Ambulatory.

## 2022-11-15 NOTE — Discharge Instructions (Signed)
Your symptoms related to constipation.  Continue to use MiraLAX, drink prune juice, and please use Fleet enema to help regulate your bowel movement.  The blood pressure is elevated today, follow-up closely with your doctor for blood pressure recheck and continue to take your blood pressure medication as previously prescribed.  Return if you have any concern.

## 2022-11-15 NOTE — ED Provider Notes (Signed)
[place] EMERGENCY DEPARTMENT AT [facility] Provider Note   CSN: [id number] Arrival date & time: 11/15/22  1324     History  Chief Complaint  Patient presents with   Bloated    Jack [name] is a 77 y.o. male.  The history is provided by the patient and medical records. No language interpreter was used.     78 year old male significant history of hyperlipidemia, diverticulosis, hepatitis C, GERD presenting to the ED with complaint of abdominal pain.  Patient report for the past 2 days he is feeling very gassy and bloating without any significant pain.  He tried using Gas-X without relief.  Does not endorse any fever or chills no lightheadedness or dizziness no nausea vomiting constipation.  He has been using MiraLAX without relief.  No urinary discomfort.  States he had a colonoscopy last year and was told it was normal.  Mention he had his prostate checked and currently waiting for this result.  Home Medications Prior to Admission medications   Medication Sig Start Date End Date Taking? Authorizing Provider  albuterol (VENTOLIN HFA) 108 (90 Base) MCG/ACT inhaler INHALE 1-2 PUFFS INTO THE LUNGS EVERY 6 (SIX) HOURS AS NEEDED FOR WHEEZING OR SHORTNESS OF BREATH. 07/11/20   Corwin Levins, MD  atenolol (TENORMIN) 25 MG tablet Take 1 tablet (25 mg total) by mouth daily. 12/06/17   Corwin Levins, MD  cetirizine (ZYRTEC) 10 MG tablet TAKE 1 TABLET BY MOUTH EVERY DAY 11/28/19   Corwin Levins, MD  citalopram (CELEXA) 20 MG tablet Take 1 tablet (20 mg total) by mouth daily. 01/19/22   Corwin Levins, MD  clonazePAM (KLONOPIN) 0.5 MG tablet TAKE 1 TABLET BY MOUTH TWICE A DAY AS NEEDED FOR ANXIETY 07/06/17   Corwin Levins, MD  diphenhydrAMINE (BENADRYL) 25 MG tablet Take 1 tablet (25 mg total) by mouth every 6 (six) hours as needed. 05/26/21   Renne Crigler, PA-C  famotidine (PEPCID) 20 MG tablet TAKE 1 TABLET BY MOUTH EVERYDAY AT BEDTIME 10/23/22   Corwin Levins, MD  fexofenadine (ALLEGRA)  180 MG tablet Take 1 tablet (180 mg total) by mouth daily. 07/31/21 07/31/22  Corwin Levins, MD  finasteride (PROSCAR) 5 MG tablet Take 1 tablet (5 mg total) by mouth daily. 12/06/17   Corwin Levins, MD  gabapentin (NEURONTIN) 300 MG capsule 1 tab by mouth at bedtime for pain and sleep as needed 12/15/21   Corwin Levins, MD  lactulose (CHRONULAC) 10 GM/15ML solution Take 45 mLs (30 g total) by mouth daily as needed for mild constipation. 08/18/19   Corwin Levins, MD  levocetirizine (XYZAL) 5 MG tablet Take 1 tablet (5 mg total) by mouth daily as needed for allergies. 06/01/16   Corwin Levins, MD  losartan-hydrochlorothiazide [facility]) 50-12.5 MG tablet TAKE 1 TABLET BY MOUTH EVERY DAY 10/22/21   Corwin Levins, MD  methimazole (TAPAZOLE) 5 MG tablet Take 1 tablet (5 mg total) by mouth 3 (three) times a week. 05/13/22   Shamleffer, Konrad Dolores, MD  methocarbamol (ROBAXIN) 500 MG tablet Take 1 tablet (500 mg total) by mouth every 8 (eight) hours as needed for muscle spasms. 01/19/22   Corwin Levins, MD  pantoprazole (PROTONIX) 40 MG tablet Take 1 tablet (40 mg total) by mouth 2 (two) times daily. 04/10/21   Corwin Levins, MD  polyethylene glycol powder [facility]) 17 GM/SCOOP powder Take 17 g by mouth daily. 08/18/19   Corwin Levins, MD  potassium chloride (KLOR-CON) 8 MEQ tablet TAKE 1 TABLET BY MOUTH EVERY DAY 01/26/22   Corwin Levins, MD  predniSONE (DELTASONE) 20 MG tablet Take 2 tablets (40 mg total) by mouth daily. 05/26/21   Renne Crigler, PA-C  rosuvastatin (CRESTOR) 40 MG tablet TAKE 1 TABLET BY MOUTH EVERY DAY 12/15/21   Corwin Levins, MD  sucralfate (CARAFATE) 1 GM/10ML suspension Take 10 mLs (1 g total) by mouth 4 (four) times daily -  with meals and at bedtime. 01/18/21   Couture, Cortni S, PA-C  traMADol (ULTRAM) 50 MG tablet TAKE 1 TABLET BY MOUTH EVERY 6 HOURS AS NEEDED. 04/28/21   Corwin Levins, MD  triamcinolone (NASACORT) 55 MCG/ACT AERO nasal inhaler Place 2 sprays into the nose daily. 07/31/21    Corwin Levins, MD      Allergies    Patient has no known allergies.    Review of Systems   Review of Systems  All other systems reviewed and are negative.   Physical Exam Updated Vital Signs BP (!) 176/88 (BP Location: Right Arm)   Pulse 61   Temp 98 F (36.7 C) (Oral)   Resp 14   Ht 5\' 6"  (1.676 m)   Wt 79.4 kg   SpO2 97%   BMI 28.25 kg/m  Physical Exam Constitutional:      General: He is not in acute distress.    Appearance: He is well-developed.  HENT:     Head: Atraumatic.  Eyes:     Conjunctiva/sclera: Conjunctivae normal.  Cardiovascular:     Rate and Rhythm: Normal rate and regular rhythm.     Pulses: Normal pulses.     Heart sounds: Normal heart sounds.  Pulmonary:     Effort: Pulmonary effort is normal.     Breath sounds: Normal breath sounds.  Abdominal:     Palpations: Abdomen is soft.     Tenderness: There is no abdominal tenderness.  Musculoskeletal:     Cervical back: Normal range of motion and neck supple.  Skin:    Findings: No rash.  Neurological:     Mental Status: He is alert. Mental status is at baseline.     ED Results / Procedures / Treatments   Labs (all labs ordered are listed, but only abnormal results are displayed) Labs Reviewed  COMPREHENSIVE METABOLIC PANEL - Abnormal; Notable for the following components:      Result Value   Potassium 3.3 (*)    Glucose, Bld 116 (*)    All other components within normal limits  URINALYSIS, ROUTINE W REFLEX MICROSCOPIC - Abnormal; Notable for the following components:   Specific Gravity, Urine 1.032 (*)    Protein, ur 30 (*)    All other components within normal limits  LIPASE, BLOOD  CBC    EKG None  Radiology CT ABDOMEN PELVIS W CONTRAST  Result Date: 11/15/2022 CLINICAL DATA:  Constipation EXAM: CT ABDOMEN AND PELVIS WITH CONTRAST TECHNIQUE: Multidetector CT imaging of the abdomen and pelvis was performed using the standard protocol following bolus administration of intravenous  contrast. RADIATION DOSE REDUCTION: This exam was performed according to the departmental dose-optimization program which includes automated exposure control, adjustment of the mA and/or kV according to patient size and/or use of iterative reconstruction technique. CONTRAST:  OMNIPAQUE IOHEXOL 300 MG/ML  SOLN COMPARISON:  CT abdomen and pelvis 09/25/2021 FINDINGS: Lower chest: There is atelectasis in the lung bases. Hepatobiliary: No focal liver abnormality is seen. No gallstones, gallbladder wall thickening, or  biliary dilatation. Pancreas: Unremarkable. No pancreatic ductal dilatation or surrounding inflammatory changes. Spleen: Normal in size without focal abnormality. Adrenals/Urinary Tract: Mild diffuse bladder wall thickening is again seen. Adrenal glands and kidneys are within normal limits. Stomach/Bowel: Stomach is within normal limits. Appendix appears normal. No evidence of bowel wall thickening, distention, or inflammatory changes. There is moderate stool burden. There is sigmoid colon diverticulosis. Vascular/Lymphatic: Aortic atherosclerosis. No enlarged abdominal or pelvic lymph nodes. Reproductive: The prostate gland is enlarged measuring 5.6 x 4.6 by 6.4 cm similar to prior. Other: No abdominal wall hernia or abnormality. No abdominopelvic ascites. Musculoskeletal: No fracture is seen. IMPRESSION: 1. No acute localizing process in the abdomen or pelvis. 2. Moderate stool burden. 3. Colonic diverticulosis. 4. Stable bladder wall thickening, likely related to chronic bladder outlet obstruction. 5. Prostatomegaly. Aortic Atherosclerosis (ICD10-I70.0). Electronically Signed   By: Darliss Cheney M.D.   On: 11/15/2022 17:29    Procedures Procedures    Medications Ordered in ED Medications  iohexol (OMNIPAQUE) 300 MG/ML solution 100 mL (100 mLs Intravenous Contrast Given 11/15/22 1615)    ED Course/ Medical Decision Making/ A&P                             Medical Decision Making Amount  and/or Complexity of Data Reviewed Labs: ordered.   BP (!) 176/88 (BP Location: Right Arm)   Pulse 61   Temp 98 F (36.7 C) (Oral)   Resp 14   Ht 5\' 6"  (1.676 m)   Wt 79.4 kg   SpO2 97%   BMI 28.25 kg/m   49:21 PM 77 year old male significant history of hyperlipidemia, diverticulosis, hepatitis C, GERD presenting to the ED with complaint of abdominal pain.  Patient report for the past 2 days he is feeling very gassy and bloating without any significant pain.  He tried using Gas-X without relief.  Does not endorse any fever or chills no lightheadedness or dizziness no nausea vomiting constipation.  He has been using MiraLAX without relief.  No urinary discomfort.  States he had a colonoscopy last year and was told it was normal.  Mention he had his prostate checked and currently waiting for this result.  On exam this is a well-appearing elderly male resting comfortably in bed appears to be in no acute discomfort.  Heart with normal rate and rhythm, lungs clear to auscultation bilaterally abdomen is soft nontender with bowel sounds present.  Vital signs notable for elevated blood pressure of 176/88.  Given his age and his presentation, will obtain abdominal pelvis CT scan for further assessment.  -Labs ordered, independently viewed and interpreted by me.  Labs remarkable for reassuring labs -The patient was maintained on a cardiac monitor.  I personally viewed and interpreted the cardiac monitored which showed an underlying rhythm of: sinus bradycardia -Imaging independently viewed and interpreted by me and I agree with radiologist's interpretation.  Result remarkable for abd/pelvis CT showing moderate stool burden, colonic diverticulosis, and stable bladder wall thickening.   -This patient presents to the ED for concern of abd pain, this involves an extensive number of treatment options, and is a complaint that carries with it a high risk of complications and morbidity.  The differential  diagnosis includes diverticulitis, colitis, appendicitis, pancreatitis, UTI, SBO -Co morbidities that complicate the patient evaluation includes GERD, Hep C, HLD, diverticulosis -Treatment includes reassurance -Reevaluation of the patient after these medicines showed that the patient stayed the same -PCP office notes  or outside notes reviewed -Discussion with specialist attending Dr. Wilkie Aye -Escalation to admission/observation considered: patients feels much better, is comfortable with discharge, and will follow up with PCP -Prescription medication considered, patient comfortable with Fleet enema, miralax -Social Determinant of Health considered which includes tobacco use  Patient does endorse constipation and CT scan did show moderate stool burden.  I performed a rectal exam with chaperone present and there is no stool impaction noted.  Patient is currently using MiraLAX, I recommend adding Fleet enema at home to aid with his bowel movement but otherwise he is stable to be discharged home.  Patient reports understanding and agrees with plan.  Please note blood pressure is elevated today, have patient rechecked by PCP.        Final Clinical Impression(s) / ED Diagnoses Final diagnoses:  Constipation, unspecified constipation type    Rx / DC Orders ED Discharge Orders          Ordered    sodium phosphate (FLEET) 7-19 GM/118ML ENEM   Once        11/15/22 1812              Fayrene Helper, PA-C 11/15/22 1813    Horton, Clabe Seal, DO 11/15/22 2332

## 2022-11-17 ENCOUNTER — Telehealth: Payer: Self-pay

## 2022-11-17 NOTE — Telephone Encounter (Signed)
Transition Care Management Unsuccessful Follow-up Telephone Call  Date of discharge and from where:  11/15/2022 [facility]  Attempts:  1st Attempt  Reason for unsuccessful TCM follow-up call:  No answer/busy  Sravya Grissom [facility]  [facility] Population Health Community Resource Care Guide   [email or web address]  ?? [phone]   Website: [email or web address]  [email or web address]

## 2022-11-17 NOTE — Telephone Encounter (Signed)
Transition Care Management Unsuccessful Follow-up Telephone Call  Date of discharge and from where:  11/15/2022 [facility]  Attempts:  2nd Attempt  Reason for unsuccessful TCM follow-up call:  No answer/busy  Jack [name] [facility]  [facility] Population Health Community Resource Care Guide   ??[email or web address]  ?? [phone]   Website: [email or web address]  [email or web address]

## 2022-12-07 ENCOUNTER — Other Ambulatory Visit: Payer: Self-pay

## 2022-12-07 ENCOUNTER — Ambulatory Visit (INDEPENDENT_AMBULATORY_CARE_PROVIDER_SITE_OTHER): Payer: Medicare Other | Admitting: Internal Medicine

## 2022-12-07 ENCOUNTER — Encounter: Payer: Self-pay | Admitting: Internal Medicine

## 2022-12-07 ENCOUNTER — Other Ambulatory Visit: Payer: Self-pay | Admitting: Internal Medicine

## 2022-12-07 VITALS — BP 138/72 | HR 75 | Temp 98.1°F | Ht 66.0 in | Wt 166.0 lb

## 2022-12-07 DIAGNOSIS — E559 Vitamin D deficiency, unspecified: Secondary | ICD-10-CM

## 2022-12-07 DIAGNOSIS — N320 Bladder-neck obstruction: Secondary | ICD-10-CM

## 2022-12-07 DIAGNOSIS — R739 Hyperglycemia, unspecified: Secondary | ICD-10-CM | POA: Diagnosis not present

## 2022-12-07 DIAGNOSIS — E7800 Pure hypercholesterolemia, unspecified: Secondary | ICD-10-CM

## 2022-12-07 DIAGNOSIS — I10 Essential (primary) hypertension: Secondary | ICD-10-CM

## 2022-12-07 DIAGNOSIS — E538 Deficiency of other specified B group vitamins: Secondary | ICD-10-CM

## 2022-12-07 DIAGNOSIS — K5900 Constipation, unspecified: Secondary | ICD-10-CM

## 2022-12-07 LAB — HEPATIC FUNCTION PANEL
ALT: 9 U/L (ref 0–53)
AST: 17 U/L (ref 0–37)
Albumin: 4.3 g/dL (ref 3.5–5.2)
Alkaline Phosphatase: 78 U/L (ref 39–117)
Bilirubin, Direct: 0.1 mg/dL (ref 0.0–0.3)
Total Bilirubin: 0.6 mg/dL (ref 0.2–1.2)
Total Protein: 7.2 g/dL (ref 6.0–8.3)

## 2022-12-07 LAB — URINALYSIS, ROUTINE W REFLEX MICROSCOPIC
Bilirubin Urine: NEGATIVE
Hgb urine dipstick: NEGATIVE
Ketones, ur: NEGATIVE
Leukocytes,Ua: NEGATIVE
Nitrite: NEGATIVE
RBC / HPF: NONE SEEN (ref 0–?)
Specific Gravity, Urine: 1.025 (ref 1.000–1.030)
Total Protein, Urine: NEGATIVE
Urine Glucose: NEGATIVE
Urobilinogen, UA: 1 (ref 0.0–1.0)
pH: 6 (ref 5.0–8.0)

## 2022-12-07 LAB — CBC WITH DIFFERENTIAL/PLATELET
Basophils Absolute: 0 10*3/uL (ref 0.0–0.1)
Basophils Relative: 0.4 % (ref 0.0–3.0)
Eosinophils Absolute: 0.1 10*3/uL (ref 0.0–0.7)
Eosinophils Relative: 0.9 % (ref 0.0–5.0)
HCT: 43.7 % (ref 39.0–52.0)
Hemoglobin: 14.4 g/dL (ref 13.0–17.0)
Lymphocytes Relative: 30.5 % (ref 12.0–46.0)
Lymphs Abs: 2.4 10*3/uL (ref 0.7–4.0)
MCHC: 33 g/dL (ref 30.0–36.0)
MCV: 97.7 fl (ref 78.0–100.0)
Monocytes Absolute: 0.6 10*3/uL (ref 0.1–1.0)
Monocytes Relative: 7.6 % (ref 3.0–12.0)
Neutro Abs: 4.7 10*3/uL (ref 1.4–7.7)
Neutrophils Relative %: 60.6 % (ref 43.0–77.0)
Platelets: 188 10*3/uL (ref 150.0–400.0)
RBC: 4.47 Mil/uL (ref 4.22–5.81)
RDW: 13 % (ref 11.5–15.5)
WBC: 7.8 10*3/uL (ref 4.0–10.5)

## 2022-12-07 LAB — BASIC METABOLIC PANEL
BUN: 16 mg/dL (ref 6–23)
CO2: 29 mEq/L (ref 19–32)
Calcium: 10.3 mg/dL (ref 8.4–10.5)
Chloride: 101 mEq/L (ref 96–112)
Creatinine, Ser: 1.22 mg/dL (ref 0.40–1.50)
GFR: 57.3 mL/min — ABNORMAL LOW (ref >60.00)
Glucose, Bld: 102 mg/dL — ABNORMAL HIGH (ref 70–99)
Potassium: 3.8 mEq/L (ref 3.5–5.1)
Sodium: 137 mEq/L (ref 135–145)

## 2022-12-07 LAB — PSA: PSA: 3.51 ng/mL (ref 0.10–4.00)

## 2022-12-07 LAB — LIPID PANEL
Cholesterol: 156 mg/dL (ref 0–200)
HDL: 64.2 mg/dL (ref >39.00)
LDL Cholesterol: 65 mg/dL (ref 0–99)
NonHDL: 91.93
Total CHOL/HDL Ratio: 2
Triglycerides: 137 mg/dL (ref 0.0–149.0)
VLDL: 27.4 mg/dL (ref 0.0–40.0)

## 2022-12-07 LAB — VITAMIN D 25 HYDROXY (VIT D DEFICIENCY, FRACTURES): VITD: 36.31 ng/mL (ref 30.00–100.00)

## 2022-12-07 LAB — VITAMIN B12: Vitamin B-12: 1196 pg/mL — ABNORMAL HIGH (ref 211–911)

## 2022-12-07 LAB — HEMOGLOBIN A1C: Hgb A1c MFr Bld: 5.7 % (ref 4.6–6.5)

## 2022-12-07 LAB — TSH: TSH: 0.17 u[IU]/mL — ABNORMAL LOW (ref 0.35–5.50)

## 2022-12-07 NOTE — Patient Instructions (Signed)
Ok to take the OTC Miralax 17 gm by mouth once daily  Please continue all other medications as before, and refills have been done if requested.  Please have the pharmacy call with any other refills you may need.  Please keep your appointments with your specialists as you may have planned  Please go to the LAB at the blood drawing area for the tests to be done  You will be contacted by phone if any changes need to be made immediately.  Otherwise, you will receive a letter about your results with an explanation, but please check with MyChart first.  Please remember to sign up for MyChart if you have not done so, as this will be important to you in the future with finding out test results, communicating by private email, and scheduling acute appointments online when needed.  Please make an Appointment to return in 6 months, or sooner if needed

## 2022-12-07 NOTE — Progress Notes (Unsigned)
Patient ID: ANAV HECHT, male   DOB: [birth year], 77 y.o.   MRN: [id number]        Chief Complaint: follow up constipation, hld, htn, hyperglycemia, low vit d       HPI:  Jack [name] is a 77 y.o. male here with c/o worsening constipation in the past month, Did have recent constipation episode with bloating and pain, seen at ED July 21, still with mild symptoms, has not tried miralax but willing to start.  Denies worsening reflux, abd pain, dysphagia, n/v, other bowel change or blood. Pt denies chest pain, increased sob or doe, wheezing, orthopnea, PND, increased LE swelling, palpitations, dizziness or syncope.   Pt denies polydipsia, polyuria, or new focal neuro s/s.    Pt denies fever, wt loss, night sweats, loss of appetite, or other constitutional symptoms   Wt Readings from Last 3 Encounters:  12/07/22 166 lb (75.3 kg)  11/15/22 175 lb (79.4 kg)  06/08/22 170 lb (77.1 kg)   BP Readings from Last 3 Encounters:  12/07/22 138/72  11/15/22 (!) 176/86  06/08/22 130/86         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    BACK PAIN 02/16/2007   COPD (chronic obstructive pulmonary disease) (HCC)    Diverticulosis 09/27/2013   FOOT PAIN, CHRONIC 02/27/2008   GERD (gastroesophageal reflux disease)    Hepatitis C antibody test positive    HERPES ZOSTER, HX OF 02/27/2008   HYPERLIPIDEMIA 02/16/2007   HYPERTENSION 02/16/2007   Personal history of colonic polyps 03/08/2007   PROSTATE SPECIFIC ANTIGEN, ELEVATED 02/16/2007   Shortness of breath dyspnea    Past Surgical History:  Procedure Laterality Date   COLONOSCOPY     FOOT SURGERY     reconstruction x's 4 after work accident    reports that he quit smoking about 38 years ago. His smoking use included cigarettes. He started smoking about 68 years ago. He has a 7.5 pack-year smoking history. He has never used smokeless tobacco. He reports that he does not drink alcohol and does not  use drugs. family history includes Alcohol abuse in his brother; Cancer in his mother and sister; Coronary artery disease in his father; Hypertension in his father. No Known Allergies Current Outpatient Medications on File Prior to Visit  Medication Sig Dispense Refill   albuterol (VENTOLIN HFA) 108 (90 Base) MCG/ACT inhaler INHALE 1-2 PUFFS INTO THE LUNGS EVERY 6 (SIX) HOURS AS NEEDED FOR WHEEZING OR SHORTNESS OF BREATH. 25.5 each 11   atenolol (TENORMIN) 25 MG tablet Take 1 tablet (25 mg total) by mouth daily. 90 tablet 3   cetirizine (ZYRTEC) 10 MG tablet TAKE 1 TABLET BY MOUTH EVERY DAY 90 tablet 3   citalopram (CELEXA) 20 MG tablet Take 1 tablet (20 mg total) by mouth daily. 90 tablet 3   clonazePAM (KLONOPIN) 0.5 MG tablet TAKE 1 TABLET BY MOUTH TWICE A DAY AS NEEDED FOR ANXIETY 60 tablet 2   diphenhydrAMINE (BENADRYL) 25 MG tablet Take 1 tablet (25 mg total) by mouth every 6 (six) hours as needed. 20 tablet 0   famotidine (PEPCID) 20 MG tablet TAKE 1 TABLET BY MOUTH EVERYDAY AT BEDTIME 90 tablet 3   finasteride (PROSCAR) 5 MG tablet Take 1 tablet (5 mg total) by mouth daily. 90 tablet 3   gabapentin (NEURONTIN) 300 MG capsule 1 tab by mouth at bedtime for pain and sleep as needed 90 capsule  1   lactulose (CHRONULAC) 10 GM/15ML solution Take 45 mLs (30 g total) by mouth daily as needed for mild constipation. 236 mL 1   levocetirizine (XYZAL) 5 MG tablet Take 1 tablet (5 mg total) by mouth daily as needed for allergies. 90 tablet 1   methimazole (TAPAZOLE) 5 MG tablet Take 1 tablet (5 mg total) by mouth 3 (three) times a week. 36 tablet 0   methocarbamol (ROBAXIN) 500 MG tablet Take 1 tablet (500 mg total) by mouth every 8 (eight) hours as needed for muscle spasms. 40 tablet 1   pantoprazole (PROTONIX) 40 MG tablet Take 1 tablet (40 mg total) by mouth 2 (two) times daily. 180 tablet 3   polyethylene glycol powder (GLYCOLAX/MIRALAX) 17 GM/SCOOP powder Take 17 g by mouth daily. 3350 g 1    potassium chloride (KLOR-CON) 8 MEQ tablet TAKE 1 TABLET BY MOUTH EVERY DAY 90 tablet 3   predniSONE (DELTASONE) 20 MG tablet Take 2 tablets (40 mg total) by mouth daily. 8 tablet 0   sucralfate (CARAFATE) 1 GM/10ML suspension Take 10 mLs (1 g total) by mouth 4 (four) times daily -  with meals and at bedtime. 420 mL 0   traMADol (ULTRAM) 50 MG tablet TAKE 1 TABLET BY MOUTH EVERY 6 HOURS AS NEEDED. 60 tablet 1   triamcinolone (NASACORT) 55 MCG/ACT AERO nasal inhaler Place 2 sprays into the nose daily. 3 each 3   fexofenadine (ALLEGRA) 180 MG tablet Take 1 tablet (180 mg total) by mouth daily. 30 tablet 2   No current facility-administered medications on file prior to visit.        ROS:  All others reviewed and negative.  Objective        PE:  BP 138/72 (BP Location: Right Arm, Patient Position: Sitting, Cuff Size: Normal)   Pulse 75   Temp 98.1 F (36.7 C) (Oral)   Ht 5\' 6"  (1.676 m)   Wt 166 lb (75.3 kg)   SpO2 98%   BMI 26.79 kg/m                 Constitutional: Pt appears in NAD               HENT: Head: NCAT.                Right Ear: External ear normal.                 Left Ear: External ear normal.                Eyes: . Pupils are equal, round, and reactive to light. Conjunctivae and EOM are normal               Nose: without d/c or deformity               Neck: Neck supple. Gross normal ROM               Cardiovascular: Normal rate and regular rhythm.                 Pulmonary/Chest: Effort normal and breath sounds without rales or wheezing.                Abd:  Soft, NT, ND, + BS, no organomegaly               Neurological: Pt is alert. At baseline orientation, motor grossly intact  Skin: Skin is warm. No rashes, no other new lesions, LE edema - none               Psychiatric: Pt behavior is normal without agitation   Micro: none  Cardiac tracings I have personally interpreted today:  none  Pertinent Radiological findings (summarize): none   Lab  Results  Component Value Date   WBC 7.8 12/07/2022   HGB 14.4 12/07/2022   HCT 43.7 12/07/2022   PLT 188.0 12/07/2022   GLUCOSE 102 (H) 12/07/2022   CHOL 156 12/07/2022   TRIG 137.0 12/07/2022   HDL 64.20 12/07/2022   LDLDIRECT 89.0 12/06/2017   LDLCALC 65 12/07/2022   ALT 9 12/07/2022   AST 17 12/07/2022   NA 137 12/07/2022   K 3.8 12/07/2022   CL 101 12/07/2022   CREATININE 1.22 12/07/2022   BUN 16 12/07/2022   CO2 29 12/07/2022   TSH 0.17 (L) 12/07/2022   PSA 3.51 12/07/2022   HGBA1C 5.7 12/07/2022   MICROALBUR 1.7 06/08/2022   Assessment/Plan:  LEIBY STANTZ is a 77 y.o. Black or African American [2] male with  has a past medical history of ALLERGIC RHINITIS (02/16/2007), Arthritis (2013), BACK PAIN (02/16/2007), COPD (chronic obstructive pulmonary disease) (HCC), Diverticulosis (09/27/2013), FOOT PAIN, CHRONIC (02/27/2008), GERD (gastroesophageal reflux disease), Hepatitis C antibody test positive, HERPES ZOSTER, HX OF (02/27/2008), HYPERLIPIDEMIA (02/16/2007), HYPERTENSION (02/16/2007), Personal history of colonic polyps (03/08/2007), PROSTATE SPECIFIC ANTIGEN, ELEVATED (02/16/2007), and Shortness of breath dyspnea.  Hyperlipidemia Lab Results  Component Value Date   LDLCALC 65 12/07/2022   Stable, pt to continue current statin crestor 40 qd   Essential hypertension BP Readings from Last 3 Encounters:  12/07/22 138/72  11/15/22 (!) 176/86  06/08/22 130/86   Stable, pt to continue medical treatment tenormin 25 every day, hyzaar 50 12.5 qd   Constipation Mild worsening, for miralax 17 gm every day prn  Hyperglycemia Lab Results  Component Value Date   HGBA1C 5.7 12/07/2022   Stable, pt to continue current medical treatment - diet, wt control   Vitamin D deficiency Last vitamin D Lab Results  Component Value Date   VD25OH 36.31 12/07/2022   Low, to start oral replacement  Followup: No follow-ups on file.  Oliver Barre, MD 12/08/2022 9:18 PM [facility]

## 2022-12-08 ENCOUNTER — Other Ambulatory Visit: Payer: Self-pay | Admitting: Internal Medicine

## 2022-12-08 ENCOUNTER — Other Ambulatory Visit: Payer: Self-pay

## 2022-12-08 ENCOUNTER — Encounter: Payer: Self-pay | Admitting: Internal Medicine

## 2022-12-08 NOTE — Assessment & Plan Note (Signed)
BP Readings from Last 3 Encounters:  12/07/22 138/72  11/15/22 (!) 176/86  06/08/22 130/86   Stable, pt to continue medical treatment tenormin 25 every day, hyzaar 50 12.5 qd

## 2022-12-08 NOTE — Assessment & Plan Note (Signed)
Lab Results  Component Value Date   LDLCALC 65 12/07/2022   Stable, pt to continue current statin crestor 40 qd

## 2022-12-08 NOTE — Assessment & Plan Note (Signed)
Last vitamin D Lab Results  Component Value Date   VD25OH 36.31 12/07/2022   Low, to start oral replacement

## 2022-12-08 NOTE — Assessment & Plan Note (Signed)
Lab Results  Component Value Date   HGBA1C 5.7 12/07/2022   Stable, pt to continue current medical treatment - diet, wt control

## 2022-12-08 NOTE — Assessment & Plan Note (Signed)
Mild worsening, for miralax 17 gm every day prn

## 2022-12-11 ENCOUNTER — Other Ambulatory Visit: Payer: Self-pay | Admitting: Internal Medicine

## 2022-12-11 ENCOUNTER — Telehealth: Payer: Self-pay

## 2022-12-11 MED ORDER — CITALOPRAM HYDROBROMIDE 20 MG PO TABS: 20.0000 mg | ORAL_TABLET | Freq: Every day | ORAL | 3 refills | Status: DC

## 2022-12-11 MED ORDER — CLONAZEPAM 0.5 MG PO TABS: ORAL_TABLET | ORAL | 2 refills | Status: DC

## 2022-12-11 NOTE — Telephone Encounter (Signed)
Pt requesting Rx for anxiety

## 2022-12-11 NOTE — Telephone Encounter (Signed)
Already done earlier today

## 2023-01-02 ENCOUNTER — Other Ambulatory Visit: Payer: Self-pay | Admitting: Internal Medicine

## 2023-01-07 ENCOUNTER — Telehealth: Payer: Self-pay | Admitting: Internal Medicine

## 2023-01-07 NOTE — Telephone Encounter (Signed)
Patient and his wife called and said they have not received the results of the labs from 12/07/22. They would like a call back at [phone] to go over them.

## 2023-01-07 NOTE — Telephone Encounter (Signed)
Called and unable to leave voicemail

## 2023-01-14 NOTE — Telephone Encounter (Signed)
Spoke with Pt wife she states understanding.

## 2023-01-14 NOTE — Telephone Encounter (Signed)
Jack [name] returned Caaleigh's call and would like a call back at [phone].

## 2023-01-19 ENCOUNTER — Telehealth: Payer: Self-pay | Admitting: Internal Medicine

## 2023-01-19 MED ORDER — CITALOPRAM HYDROBROMIDE 20 MG PO TABS: 20.0000 mg | ORAL_TABLET | Freq: Every day | ORAL | 3 refills | Status: DC

## 2023-01-19 NOTE — Telephone Encounter (Signed)
Prescription Request  01/19/2023  LOV: 12/07/2022  What is the name of the medication or equipment? citalopram (CELEXA) 20 MG tablet   Have you contacted your pharmacy to request a refill? No   which pharmacy would you like this sent to?  [id number] [place], [place] - [address] [place] [address] Phone: [phone] Fax: [phone]  Patient notified that their request is being sent to the clinical staff for review and that they should receive a response within 2 business days.   Please advise at Mobile [phone] (mobile)

## 2023-01-19 NOTE — Telephone Encounter (Signed)
Ok done to [facility]

## 2023-03-03 ENCOUNTER — Telehealth: Payer: Self-pay | Admitting: Internal Medicine

## 2023-03-03 NOTE — Telephone Encounter (Signed)
Prescription Request  03/03/2023  LOV: 12/07/2022  What is the name of the medication or equipment? Losartan - hydrachlorathizide  Have you contacted your pharmacy to request a refill? Yes   Which pharmacy would you like this sent to?  [id number] [place], [place] - [address] [place] [address] Phone: [phone] Fax: [phone]     Patient notified that their request is being sent to the clinical staff for review and that they should receive a response within 2 business days.   Please advise at Mobile [phone] (mobile)

## 2023-03-04 NOTE — Telephone Encounter (Signed)
A year supply was sent in August. Refill not due.

## 2023-03-22 ENCOUNTER — Telehealth: Payer: Self-pay | Admitting: Internal Medicine

## 2023-03-22 MED ORDER — CLONAZEPAM 0.5 MG PO TABS: ORAL_TABLET | ORAL | 2 refills | Status: DC

## 2023-03-22 NOTE — Telephone Encounter (Signed)
Prescription Request  03/22/2023  LOV: 12/07/2022  What is the name of the medication or equipment? clonazePAM (KLONOPIN) 0.5 MG tablet   Have you contacted your pharmacy to request a refill? No   Which pharmacy would you like this sent to?    [id number] [place], [place] - [address] [place] [address] Phone: [phone] Fax: [phone]  Patient notified that their request is being sent to the clinical staff for review and that they should receive a response within 2 business days.   Please advise at Mobile [phone] (mobile)

## 2023-03-22 NOTE — Telephone Encounter (Signed)
Done erx 

## 2023-05-07 ENCOUNTER — Ambulatory Visit (INDEPENDENT_AMBULATORY_CARE_PROVIDER_SITE_OTHER): Payer: Medicare Other

## 2023-05-07 DIAGNOSIS — Z0000 Encounter for general adult medical examination without abnormal findings: Secondary | ICD-10-CM

## 2023-05-07 NOTE — Patient Instructions (Signed)
 Mr. Vicuna , Thank you for taking time to come for your Medicare Wellness Visit. I appreciate your ongoing commitment to your health goals. Please review the following plan we discussed and let me know if I can assist you in the future.   Referrals/Orders/Follow-Ups/Clinician Recommendations: none  This is a list of the screening recommended for you and due dates:  Health Maintenance  Topic Date Due   Zoster (Shingles) Vaccine (2 of 2) 10/13/2022   Medicare Annual Wellness Visit  05/06/2024   DTaP/Tdap/Td vaccine (3 - Td or Tdap) 12/04/2026   Pneumonia Vaccine  Completed   Flu Shot  Completed   COVID-19 Vaccine  Completed   Hepatitis C Screening  Completed   HPV Vaccine  Aged Out   Colon Cancer Screening  Discontinued    Advanced directives: (ACP Link)Information on Advanced Care Planning can be found at [place]  Secretary of State Advance Health Care Directives Advance Health Care Directives ([email or web address]   Next Medicare Annual Wellness Visit scheduled for next year: Yes  insert Preventive Care attachment Insert FALL PREVENTION attachment if needed

## 2023-05-07 NOTE — Progress Notes (Signed)
 Subjective:   Jack [name] is a 78 y.o. male who presents for Medicare Annual/Subsequent preventive examination.  Visit Complete: Virtual I connected with  Jack [name] on 05/07/23 by a audio enabled telemedicine application and verified that I am speaking with the correct person using two identifiers. Wife also on line.  Interactive audio and video telecommunications were attempted between this provider and patient, however failed, due to patient having technical difficulties OR patient did not have access to video capability.  We continued and completed visit with audio only.  Patient Location: Home  Provider Location: Home Office  I discussed the limitations of evaluation and management by telemedicine. The patient expressed understanding and agreed to proceed.  Vital Signs: Because this visit was a virtual/telehealth visit, some criteria may be missing or patient reported. Any vitals not documented were not able to be obtained and vitals that have been documented are patient reported.    Cardiac Risk Factors include: advanced age (>32men, >16 women);dyslipidemia;hypertension;male gender     Objective:    Today's Vitals   There is no height or weight on file to calculate BMI.     05/07/2023   11:01 AM 11/15/2022    1:45 PM 01/08/2022    1:54 PM 09/25/2021   12:36 PM 05/26/2021    6:46 PM 02/28/2021   12:56 PM 01/18/2021    2:37 PM  Advanced Directives  Does Patient Have a Medical Advance Directive? No No No No No No No  Would patient like information on creating a medical advance directive?  No - Patient declined No - Patient declined No - Patient declined   Yes (ED - Information included in AVS)    Current Medications (verified) Outpatient Encounter Medications as of 05/07/2023  Medication Sig   albuterol  (VENTOLIN  HFA) 108 (90 Base) MCG/ACT inhaler INHALE 1-2 PUFFS INTO THE LUNGS EVERY 6 (SIX) HOURS AS NEEDED FOR WHEEZING OR SHORTNESS OF BREATH.   atenolol  (TENORMIN ) 25  MG tablet Take 1 tablet (25 mg total) by mouth daily.   cetirizine  (ZYRTEC ) 10 MG tablet TAKE 1 TABLET BY MOUTH EVERY DAY   citalopram  (CELEXA ) 20 MG tablet Take 1 tablet (20 mg total) by mouth daily.   clonazePAM  (KLONOPIN ) 0.5 MG tablet TAKE 1 TABLET BY MOUTH TWICE A DAY AS NEEDED FOR ANXIETY   diphenhydrAMINE  (BENADRYL ) 25 MG tablet Take 1 tablet (25 mg total) by mouth every 6 (six) hours as needed.   finasteride  (PROSCAR ) 5 MG tablet Take 1 tablet (5 mg total) by mouth daily.   losartan -hydrochlorothiazide  (HYZAAR) 50-12.5 MG tablet TAKE 1 TABLET BY MOUTH EVERY DAY   methimazole  (TAPAZOLE ) 5 MG tablet Take 1 tablet (5 mg total) by mouth 3 (three) times a week.   polyethylene glycol powder (GLYCOLAX /MIRALAX ) 17 GM/SCOOP powder Take 17 g by mouth daily.   potassium chloride  (KLOR-CON ) 8 MEQ tablet TAKE 1 TABLET BY MOUTH EVERY DAY   predniSONE  (DELTASONE ) 20 MG tablet Take 2 tablets (40 mg total) by mouth daily.   rosuvastatin  (CRESTOR ) 40 MG tablet TAKE 1 TABLET BY MOUTH EVERY DAY   sucralfate  (CARAFATE ) 1 GM/10ML suspension Take 10 mLs (1 g total) by mouth 4 (four) times daily -  with meals and at bedtime.   triamcinolone  (NASACORT ) 55 MCG/ACT AERO nasal inhaler Place 2 sprays into the nose daily.   famotidine  (PEPCID ) 20 MG tablet TAKE 1 TABLET BY MOUTH EVERYDAY AT BEDTIME (Patient not taking: Reported on 05/07/2023)   fexofenadine  (ALLEGRA ) 180 MG tablet Take  1 tablet (180 mg total) by mouth daily.   gabapentin  (NEURONTIN ) 300 MG capsule 1 tab by mouth at bedtime for pain and sleep as needed (Patient not taking: Reported on 05/07/2023)   lactulose  (CHRONULAC ) 10 GM/15ML solution Take 45 mLs (30 g total) by mouth daily as needed for mild constipation. (Patient not taking: Reported on 05/07/2023)   levocetirizine (XYZAL ) 5 MG tablet Take 1 tablet (5 mg total) by mouth daily as needed for allergies. (Patient not taking: Reported on 05/07/2023)   methocarbamol  (ROBAXIN ) 500 MG tablet Take 1 tablet  (500 mg total) by mouth every 8 (eight) hours as needed for muscle spasms. (Patient not taking: Reported on 05/07/2023)   pantoprazole  (PROTONIX ) 40 MG tablet Take 1 tablet (40 mg total) by mouth 2 (two) times daily. (Patient not taking: Reported on 05/07/2023)   traMADol  (ULTRAM ) 50 MG tablet TAKE 1 TABLET BY MOUTH EVERY 6 HOURS AS NEEDED. (Patient not taking: Reported on 05/07/2023)   No facility-administered encounter medications on file as of 05/07/2023.    Allergies (verified) Patient has no known allergies.   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    BACK PAIN 02/16/2007   COPD (chronic obstructive pulmonary disease) (HCC)    Diverticulosis 09/27/2013   FOOT PAIN, CHRONIC 02/27/2008   GERD (gastroesophageal reflux disease)    Hepatitis C antibody test positive    HERPES ZOSTER, HX OF 02/27/2008   HYPERLIPIDEMIA 02/16/2007   HYPERTENSION 02/16/2007   Personal history of colonic polyps 03/08/2007   PROSTATE SPECIFIC ANTIGEN, ELEVATED 02/16/2007   Shortness of breath dyspnea    Past Surgical History:  Procedure Laterality Date   COLONOSCOPY     FOOT SURGERY     reconstruction x's 4 after work accident   Family History  Problem Relation Age of Onset   Cancer Mother        throat & lung   Coronary artery disease Father    Hypertension Father    Cancer Sister    Alcohol abuse Brother    Thyroid  disease Neg Hx    Colon cancer Neg Hx    Esophageal cancer Neg Hx    Stomach cancer Neg Hx    Rectal cancer Neg Hx    Social History   Socioeconomic History   Marital status: Married    Spouse name: Not on file   Number of children: 3   Years of education: Not on file   Highest education level: Not on file  Occupational History   Not on file  Tobacco Use   Smoking status: Former    Current packs/day: 0.00    Average packs/day: 0.3 packs/day for 30.0 years (7.5 ttl pk-yrs)    Types: Cigarettes    Start  date: 04/27/1954    Quit date: 04/27/1984    Years since quitting: 39.0   Smokeless tobacco: Never  Vaping Use   Vaping status: Never Used  Substance and Sexual Activity   Alcohol use: No    Alcohol/week: 0.0 standard drinks of alcohol    Comment: no   Drug use: No   Sexual activity: Yes    Partners: Female  Other Topics Concern   Not on file  Social History Narrative   11 grade education   Married 1968   2 son's='69, '70; 1 dtr '80; 5 grandsons   Retired on disability from foot injury. Had worked as a midwife   Marriage in  good health-wife smokes 3 pks day   Social Drivers of Corporate Investment Banker Strain: Medium Risk (05/07/2023)   Overall Financial Resource Strain (CARDIA)    Difficulty of Paying Living Expenses: Somewhat hard  Food Insecurity: Food Insecurity Present (05/07/2023)   Hunger Vital Sign    Worried About Running Out of Food in the Last Year: Sometimes true    Ran Out of Food in the Last Year: Sometimes true  Transportation Needs: No Transportation Needs (05/07/2023)   PRAPARE - Administrator, Civil Service (Medical): No    Lack of Transportation (Non-Medical): No  Physical Activity: Insufficiently Active (05/07/2023)   Exercise Vital Sign    Days of Exercise per Week: 7 days    Minutes of Exercise per Session: 10 min  Stress: No Stress Concern Present (05/07/2023)   [employer] of Occupational Health - Occupational Stress Questionnaire    Feeling of Stress : Only a little  Social Connections: Moderately Isolated (05/07/2023)   Social Connection and Isolation Panel [NHANES]    Frequency of Communication with Friends and Family: Three times a week    Frequency of Social Gatherings with Friends and Family: Never    Attends Religious Services: Never    Database Administrator or Organizations: No    Attends Engineer, Structural: Never    Marital Status: Married    Tobacco Counseling Counseling given: Not  Answered   Clinical Intake:  Pre-visit preparation completed: Yes  Pain : No/denies pain     Nutritional Risks: None Diabetes: No  How often do you need to have someone help you when you read instructions, pamphlets, or other written materials from your doctor or pharmacy?: 1 - Never  Interpreter Needed?: No  Information entered by :: NAllen LPN   Activities of Daily Living    05/07/2023   10:54 AM  In your present state of health, do you have any difficulty performing the following activities:  Hearing? 1  Comment has hearing aids, but does not wear  Vision? 0  Difficulty concentrating or making decisions? 0  Walking or climbing stairs? 0  Dressing or bathing? 0  Doing errands, shopping? 0  Preparing Food and eating ? N  Using the Toilet? N  In the past six months, have you accidently leaked urine? N  Do you have problems with loss of bowel control? N  Managing your Medications? N  Managing your Finances? N  Housekeeping or managing your Housekeeping? N    Patient Care Team: Jack Lynwood ORN, MD as PCP - General (Internal Medicine) Nieves Cough, MD as Attending Physician (Urology) Trixie File, MD as Consulting Physician (Internal Medicine) Avram Lupita BRAVO, MD as Consulting Physician (Gastroenterology)  Indicate any recent Medical Services you may have received from other than [facility] providers in the past year (date may be approximate).     Assessment:   This is a routine wellness examination for Jabar.  Hearing/Vision screen Hearing Screening - Comments:: Has hearing aids, but does not wear Vision Screening - Comments:: Regular eye exams, [facility]   Goals Addressed             This Visit's Progress    Patient Stated       05/07/2023, denies goals       Depression Screen    05/07/2023   11:03 AM 12/07/2022    8:20 AM 06/08/2022    8:57 AM 01/19/2022   10:08 AM 01/19/2022    9:54 AM  12/15/2021   11:39 AM 12/15/2021   11:38 AM  PHQ 2/9  Scores  PHQ - 2 Score 0 0 0 1 0    PHQ- 9 Score   0  0    Exception Documentation      Patient refusal Patient refusal    Fall Risk    05/07/2023   11:02 AM 12/07/2022    8:20 AM 06/08/2022    8:57 AM 01/19/2022    9:54 AM 12/15/2021   11:39 AM  Fall Risk   Falls in the past year? 0 0 0 0 0  Number falls in past yr: 0 0 0  0  Injury with Fall? 0 0 0  0  Risk for fall due to : Medication side effect No Fall Risks No Fall Risks No Fall Risks No Fall Risks  Follow up Falls prevention discussed;Falls evaluation completed Falls evaluation completed Falls evaluation completed Falls evaluation completed Falls evaluation completed    MEDICARE RISK AT HOME: Medicare Risk at Home Any stairs in or around the home?: No If so, are there any without handrails?: No Home free of loose throw rugs in walkways, pet beds, electrical cords, etc?: Yes Adequate lighting in your home to reduce risk of falls?: Yes Life alert?: No Use of a cane, walker or w/c?: No Grab bars in the bathroom?: Yes Shower chair or bench in shower?: No Elevated toilet seat or a handicapped toilet?: No  TIMED UP AND GO:  Was the test performed?  No    Cognitive Function:    06/29/2018    3:13 PM  MMSE - Mini Mental State Exam  Orientation to time 5  Orientation to Place 5  Registration 3  Attention/ Calculation 3  Recall 1  Language- name 2 objects 2  Language- repeat 1  Language- follow 3 step command 3  Language- read & follow direction 1  Write a sentence 1  Copy design 0  Total score 25        05/07/2023   11:03 AM  6CIT Screen  What Year? 0 points  What month? 0 points  What time? 0 points  Count back from 20 0 points  Months in reverse 4 points  Repeat phrase 10 points  Total Score 14 points    Immunizations Immunization History  Administered Date(s) Administered   Fluad Quad(high Dose 65+) 03/29/2019, 01/26/2020, 01/24/2021   Influenza Whole 02/16/2007, 02/27/2008   Influenza, High Dose  Seasonal PF 03/09/2016, 02/26/2018, 06/29/2018   Influenza,inj,Quad PF,6+ Mos 05/17/2014   Influenza-Unspecified 01/12/2022   Moderna Covid-19 Fall Seasonal Vaccine 81yrs & older 02/16/2023   PFIZER(Purple Top)SARS-COV-2 Vaccination 06/12/2019, 07/03/2019, 02/10/2020   Pneumococcal Conjugate-13 09/27/2013   Pneumococcal Polysaccharide-23 02/27/2008, 10/04/2014   Rotavirus,unspecified  12/27/2021   Td 02/16/2007   Tdap 12/03/2016   Zoster Recombinant(Shingrix) 08/18/2022    TDAP status: Up to date  Flu Vaccine status: Up to date  Pneumococcal vaccine status: Up to date  Covid-19 vaccine status: Completed vaccines  Qualifies for Shingles Vaccine? Yes   Zostavax completed No   Shingrix Completed?: needs second dose  Screening Tests Health Maintenance  Topic Date Due   Zoster Vaccines- Shingrix (2 of 2) 10/13/2022   Medicare Annual Wellness (AWV)  05/06/2024   DTaP/Tdap/Td (3 - Td or Tdap) 12/04/2026   Pneumonia Vaccine 45+ Years old  Completed   INFLUENZA VACCINE  Completed   COVID-19 Vaccine  Completed   Hepatitis C Screening  Completed   HPV VACCINES  Aged Out  Colonoscopy  Discontinued    Health Maintenance  Health Maintenance Due  Topic Date Due   Zoster Vaccines- Shingrix (2 of 2) 10/13/2022    Colorectal cancer screening: No longer required.   Lung Cancer Screening: (Low Dose CT Chest recommended if Age 24-80 years, 20 pack-year currently smoking OR have quit w/in 15years.) does not qualify.   Lung Cancer Screening Referral: no  Additional Screening:  Hepatitis C Screening: does qualify; Completed 12/03/2015  Vision Screening: Recommended annual ophthalmology exams for early detection of glaucoma and other disorders of the eye. Is the patient up to date with their annual eye exam?  Yes  Who is the provider or what is the name of the office in which the patient attends annual eye exams? [facility] If pt is not established with a provider, would they like  to be referred to a provider to establish care? No .   Dental Screening: Recommended annual dental exams for proper oral hygiene  Diabetic Foot Exam: n/a  Community Resource Referral / Chronic Care Management: CRR required this visit?  No   CCM required this visit?  No     Plan:     I have personally reviewed and noted the following in the patient's chart:   Medical and social history Use of alcohol, tobacco or illicit drugs  Current medications and supplements including opioid prescriptions. Patient is not currently taking opioid prescriptions. Functional ability and status Nutritional status Physical activity Advanced directives List of other physicians Hospitalizations, surgeries, and ER visits in previous 12 months Vitals Screenings to include cognitive, depression, and falls Referrals and appointments  In addition, I have reviewed and discussed with patient certain preventive protocols, quality metrics, and best practice recommendations. A written personalized care plan for preventive services as well as general preventive health recommendations were provided to patient.     Ardella FORBES Dawn, LPN   8/89/7974   After Visit Summary: (Pick Up) Due to this being a telephonic visit, with patients personalized plan was offered to patient and patient has requested to Pick up at office.  Nurse Notes: none

## 2023-05-10 ENCOUNTER — Telehealth: Payer: Self-pay | Admitting: *Deleted

## 2023-05-10 NOTE — Progress Notes (Signed)
 Complex Care Management Note Care Guide Note  05/10/2023 Name: BINGHAM MILLETTE MRN: [id number] DOB: Dec 07, 1945   Complex Care Management Outreach Attempts: An unsuccessful telephone outreach was attempted today to offer the patient information about available complex care management services.  Follow Up Plan:  Additional outreach attempts will be made to offer the patient complex care management information and services.   Encounter Outcome:  No Answer  Thedford Franks, CCMA Care Coordination Care Guide Direct Dial: [phone]

## 2023-05-12 NOTE — Progress Notes (Signed)
Complex Care Management Note Care Guide Note  05/12/2023 Name: Jack [name] MRN: [id number] DOB: 1946/02/16   Complex Care Management Outreach Attempts: A second unsuccessful outreach was attempted today to offer the patient with information about available complex care management services.  Follow Up Plan:  Additional outreach attempts will be made to offer the patient complex care management information and services.   Encounter Outcome:  No Answer  Burman Nieves, CMA, Care Guide [facility]  [facility], [facility] Guide Direct Dial: [phone]  Fax: [phone] Website: [email or web address]

## 2023-05-13 NOTE — Progress Notes (Signed)
Complex Care Management Note Care Guide Note  05/13/2023 Name: Jack [name] MRN: [id number] DOB: 14-Jan-1946   Complex Care Management Outreach Attempts: A third unsuccessful outreach was attempted today to offer the patient with information about available complex care management services.  Follow Up Plan:  No further outreach attempts will be made at this time. We have been unable to contact the patient to offer or enroll patient in complex care management services.  Encounter Outcome:  No Answer  Burman Nieves, CMA, Care Guide [facility]  [facility], [facility] Guide Direct Dial: [phone]  Fax: [phone] Website: [email or web address]

## 2023-05-27 DIAGNOSIS — H25813 Combined forms of age-related cataract, bilateral: Secondary | ICD-10-CM | POA: Diagnosis not present

## 2023-05-27 DIAGNOSIS — H524 Presbyopia: Secondary | ICD-10-CM | POA: Diagnosis not present

## 2023-05-27 DIAGNOSIS — H5202 Hypermetropia, left eye: Secondary | ICD-10-CM | POA: Diagnosis not present

## 2023-05-27 DIAGNOSIS — H52223 Regular astigmatism, bilateral: Secondary | ICD-10-CM | POA: Diagnosis not present

## 2023-05-31 ENCOUNTER — Other Ambulatory Visit: Payer: Self-pay | Admitting: Internal Medicine

## 2023-05-31 MED ORDER — CLONAZEPAM 0.5 MG PO TABS: ORAL_TABLET | ORAL | 2 refills | Status: DC

## 2023-05-31 NOTE — Telephone Encounter (Signed)
Copied from CRM [id number]. Topic: Clinical - Medication Refill >> May 31, 2023  2:15 PM Rodman Pickle T wrote: Most Recent Primary Care Visit:  Provider: Barb Merino  Department: [facility] [place]  Visit Type: MEDICARE AWV, SEQUENTIAL  Date: 05/07/2023  Medication: clonazePAM (KLONOPIN) 0.5 MG tablet  Has the patient contacted their pharmacy? Yes (Agent: If no, request that the patient contact the pharmacy for the refill. If patient does not wish to contact the pharmacy document the reason why and proceed with request.) (Agent: If yes, when and what did the pharmacy advise?)  Is this the correct pharmacy for this prescription? Yes If no, delete pharmacy and type the correct one.  This is the patient's preferred pharmacy:  [id number] [place], [place] - [address] [place] [address] Phone: [phone] Fax: [phone]  [facility] [address] [place] [address] Phone: [phone] Fax: [phone]   Has the prescription been filled recently? No  Is the patient out of the medication? Yes  Has the patient been seen for an appointment in the last year OR does the patient have an upcoming appointment? Yes  Can we respond through MyChart? No  Agent: Please be advised that Rx refills may take up to 3 business days. We ask that you follow-up with your pharmacy.

## 2023-06-01 ENCOUNTER — Ambulatory Visit: Payer: Medicare Other | Admitting: Internal Medicine

## 2023-06-14 ENCOUNTER — Telehealth: Payer: Self-pay | Admitting: Internal Medicine

## 2023-06-14 MED ORDER — CLONAZEPAM 0.5 MG PO TABS: ORAL_TABLET | ORAL | 2 refills | Status: DC

## 2023-06-14 NOTE — Telephone Encounter (Signed)
 Ok done erx

## 2023-06-14 NOTE — Telephone Encounter (Signed)
 Copied from CRM [id number]. Topic: Clinical - Medication Refill >> May 31, 2023  2:15 PM Rodman Pickle T wrote: Most Recent Primary Care Visit:  Provider: Barb Merino  Department: [facility] [place]  Visit Type: MEDICARE AWV, SEQUENTIAL  Date: 05/07/2023  Medication: clonazePAM (KLONOPIN) 0.5 MG tablet  Has the patient contacted their pharmacy? Yes (Agent: If no, request that the patient contact the pharmacy for the refill. If patient does not wish to contact the pharmacy document the reason why and proceed with request.) (Agent: If yes, when and what did the pharmacy advise?)  Is this the correct pharmacy for this prescription? Yes If no, delete pharmacy and type the correct one.  This is the patient's preferred pharmacy:  [id number] [place], [place] - [address] [place] [address] Phone: [phone] Fax: [phone]  [facility] [address] [place] [address] Phone: [phone] Fax: [phone]   Has the prescription been filled recently? No  Is the patient out of the medication? Yes  Has the patient been seen for an appointment in the last year OR does the patient have an upcoming appointment? Yes  Can we respond through MyChart? No  Agent: Please be advised that Rx refills may take up to 3 business days. We ask that you follow-up with your pharmacy. >> Jun 11, 2023  2:14 PM Tiffany H wrote: Patient's wife called in to request a refill for Clonazapam. Call dropped after PAS advised patient's wife that medication had several refills at pharmacy.   Called patient's wife's number. No answer. Please reach out to patient.

## 2023-06-29 ENCOUNTER — Encounter: Payer: Self-pay | Admitting: Internal Medicine

## 2023-06-29 ENCOUNTER — Ambulatory Visit: Payer: Medicare Other | Admitting: Internal Medicine

## 2023-06-29 VITALS — BP 124/70 | HR 65 | Temp 98.3°F | Ht 66.0 in | Wt 180.0 lb

## 2023-06-29 DIAGNOSIS — Z0001 Encounter for general adult medical examination with abnormal findings: Secondary | ICD-10-CM

## 2023-06-29 DIAGNOSIS — Z0000 Encounter for general adult medical examination without abnormal findings: Secondary | ICD-10-CM | POA: Diagnosis not present

## 2023-06-29 DIAGNOSIS — R739 Hyperglycemia, unspecified: Secondary | ICD-10-CM

## 2023-06-29 DIAGNOSIS — M25522 Pain in left elbow: Secondary | ICD-10-CM

## 2023-06-29 DIAGNOSIS — E538 Deficiency of other specified B group vitamins: Secondary | ICD-10-CM | POA: Diagnosis not present

## 2023-06-29 DIAGNOSIS — E559 Vitamin D deficiency, unspecified: Secondary | ICD-10-CM

## 2023-06-29 DIAGNOSIS — I10 Essential (primary) hypertension: Secondary | ICD-10-CM

## 2023-06-29 DIAGNOSIS — E7800 Pure hypercholesterolemia, unspecified: Secondary | ICD-10-CM | POA: Diagnosis not present

## 2023-06-29 DIAGNOSIS — N320 Bladder-neck obstruction: Secondary | ICD-10-CM

## 2023-06-29 DIAGNOSIS — K5900 Constipation, unspecified: Secondary | ICD-10-CM | POA: Diagnosis not present

## 2023-06-29 LAB — URINALYSIS, ROUTINE W REFLEX MICROSCOPIC
Bilirubin Urine: NEGATIVE
Hgb urine dipstick: NEGATIVE
Ketones, ur: NEGATIVE
Leukocytes,Ua: NEGATIVE
Nitrite: NEGATIVE
RBC / HPF: NONE SEEN (ref 0–?)
Specific Gravity, Urine: >=1.03 — AB (ref 1.000–1.030)
Total Protein, Urine: NEGATIVE
Urine Glucose: NEGATIVE
Urobilinogen, UA: 0.2 (ref 0.0–1.0)
pH: 6 (ref 5.0–8.0)

## 2023-06-29 LAB — MICROALBUMIN / CREATININE URINE RATIO
Creatinine,U: 215.9 mg/dL
Microalb Creat Ratio: 5.7 mg/g (ref 0.0–30.0)
Microalb, Ur: 1.2 mg/dL (ref 0.0–1.9)

## 2023-06-29 LAB — CBC WITH DIFFERENTIAL/PLATELET
Basophils Absolute: 0 10*3/uL (ref 0.0–0.1)
Basophils Relative: 0.3 % (ref 0.0–3.0)
Eosinophils Absolute: 0.1 10*3/uL (ref 0.0–0.7)
Eosinophils Relative: 1 % (ref 0.0–5.0)
HCT: 40 % (ref 39.0–52.0)
Hemoglobin: 13.5 g/dL (ref 13.0–17.0)
Lymphocytes Relative: 29.6 % (ref 12.0–46.0)
Lymphs Abs: 2.4 10*3/uL (ref 0.7–4.0)
MCHC: 33.8 g/dL (ref 30.0–36.0)
MCV: 97.9 fl (ref 78.0–100.0)
Monocytes Absolute: 0.7 10*3/uL (ref 0.1–1.0)
Monocytes Relative: 8.6 % (ref 3.0–12.0)
Neutro Abs: 4.9 10*3/uL (ref 1.4–7.7)
Neutrophils Relative %: 60.5 % (ref 43.0–77.0)
Platelets: 201 10*3/uL (ref 150.0–400.0)
RBC: 4.09 Mil/uL — ABNORMAL LOW (ref 4.22–5.81)
RDW: 13 % (ref 11.5–15.5)
WBC: 8.1 10*3/uL (ref 4.0–10.5)

## 2023-06-29 LAB — VITAMIN B12: Vitamin B-12: 795 pg/mL (ref 211–911)

## 2023-06-29 LAB — HEPATIC FUNCTION PANEL
ALT: 9 U/L (ref 0–53)
AST: 20 U/L (ref 0–37)
Albumin: 4.1 g/dL (ref 3.5–5.2)
Alkaline Phosphatase: 69 U/L (ref 39–117)
Bilirubin, Direct: 0.1 mg/dL (ref 0.0–0.3)
Total Bilirubin: 0.5 mg/dL (ref 0.2–1.2)
Total Protein: 7.3 g/dL (ref 6.0–8.3)

## 2023-06-29 LAB — BASIC METABOLIC PANEL
BUN: 16 mg/dL (ref 6–23)
CO2: 28 meq/L (ref 19–32)
Calcium: 9.9 mg/dL (ref 8.4–10.5)
Chloride: 104 meq/L (ref 96–112)
Creatinine, Ser: 1.22 mg/dL (ref 0.40–1.50)
GFR: 57.08 mL/min — ABNORMAL LOW (ref >60.00)
Glucose, Bld: 105 mg/dL — ABNORMAL HIGH (ref 70–99)
Potassium: 3.7 meq/L (ref 3.5–5.1)
Sodium: 138 meq/L (ref 135–145)

## 2023-06-29 LAB — LIPID PANEL
Cholesterol: 159 mg/dL (ref 0–200)
HDL: 61.8 mg/dL (ref >39.00)
LDL Cholesterol: 74 mg/dL (ref 0–99)
NonHDL: 97.02
Total CHOL/HDL Ratio: 3
Triglycerides: 116 mg/dL (ref 0.0–149.0)
VLDL: 23.2 mg/dL (ref 0.0–40.0)

## 2023-06-29 LAB — PSA: PSA: 2.96 ng/mL (ref 0.10–4.00)

## 2023-06-29 LAB — TSH: TSH: 0.36 u[IU]/mL (ref 0.35–5.50)

## 2023-06-29 LAB — VITAMIN D 25 HYDROXY (VIT D DEFICIENCY, FRACTURES): VITD: 26.94 ng/mL — ABNORMAL LOW (ref 30.00–100.00)

## 2023-06-29 LAB — HEMOGLOBIN A1C: Hgb A1c MFr Bld: 5.8 % (ref 4.6–6.5)

## 2023-06-29 MED ORDER — TRIAMCINOLONE ACETONIDE 55 MCG/ACT NA AERO: 2.0000 | INHALATION_SPRAY | Freq: Every day | NASAL | 3 refills | Status: AC

## 2023-06-29 MED ORDER — FAMOTIDINE 20 MG PO TABS: 20.0000 mg | ORAL_TABLET | Freq: Every day | ORAL | 3 refills | Status: AC

## 2023-06-29 MED ORDER — ALBUTEROL SULFATE HFA 108 (90 BASE) MCG/ACT IN AERS
1.0000 | INHALATION_SPRAY | Freq: Four times a day (QID) | RESPIRATORY_TRACT | 11 refills | Status: AC | PRN
Start: 2023-06-29 — End: ?

## 2023-06-29 MED ORDER — ROSUVASTATIN CALCIUM 40 MG PO TABS: 40.0000 mg | ORAL_TABLET | Freq: Every day | ORAL | 3 refills | Status: AC

## 2023-06-29 MED ORDER — PANTOPRAZOLE SODIUM 40 MG PO TBEC: 40.0000 mg | DELAYED_RELEASE_TABLET | Freq: Two times a day (BID) | ORAL | 3 refills | Status: DC

## 2023-06-29 MED ORDER — CITALOPRAM HYDROBROMIDE 20 MG PO TABS: 20.0000 mg | ORAL_TABLET | Freq: Every day | ORAL | 3 refills | Status: DC

## 2023-06-29 MED ORDER — LOSARTAN POTASSIUM-HCTZ 50-12.5 MG PO TABS: 1.0000 | ORAL_TABLET | Freq: Every day | ORAL | 3 refills | Status: AC

## 2023-06-29 MED ORDER — LACTULOSE 10 GM/15ML PO SOLN: 30.0000 g | Freq: Every day | ORAL | 1 refills | Status: DC | PRN

## 2023-06-29 NOTE — Patient Instructions (Signed)
 Ok to use the OTC Voltaren gel to the left elbow as needed for pain  Please continue all other medications as before, and refills have been done if requested.  Please have the pharmacy call with any other refills you may need.  Please continue your efforts at being more active, low cholesterol diet, and weight control.  You are otherwise up to date with prevention measures today.  Please keep your appointments with your specialists as you may have planned  Please go to the LAB at the blood drawing area for the tests to be done  You will be contacted by phone if any changes need to be made immediately.  Otherwise, you will receive a letter about your results with an explanation, but please check with MyChart first.  Please make an Appointment to return in 6 months, or sooner if needed

## 2023-06-29 NOTE — Progress Notes (Unsigned)
 Patient ID: Jack [name], male   DOB: Jan 21, 1946, 78 y.o.   MRN: [id number]         Chief Complaint:: wellness exam and left elbow pain, constipation, htn, hyperglycemia, hld       HPI:  Jack [name] is a 78 y.o. male here for wellness exam; for shingrix and covid booster at pharmacy, o/w up to date                        Also to see urology in November, for cataract surg in [place]. C/o 1 wk onset tenderness at the left bicep tendon at the elbow, after working on his car recently.  Pt denies chest pain, increased sob or doe, wheezing, orthopnea, PND, increased LE swelling, palpitations, dizziness or syncope.   Pt denies polydipsia, polyuria, or new focal neuro s/s.    Pt denies fever, wt loss, night sweats, loss of appetite, or other constitutional symptoms   Denies worsening reflux, abd pain, dysphagia, n/v, or blood, but has worsening constipatioin.     Wt Readings from Last 3 Encounters:  06/29/23 180 lb (81.6 kg)  12/07/22 166 lb (75.3 kg)  11/15/22 175 lb (79.4 kg)   BP Readings from Last 3 Encounters:  06/29/23 124/70  12/07/22 138/72  11/15/22 (!) 176/86   Immunization History  Administered Date(s) Administered   Fluad Quad(high Dose 65+) 03/29/2019, 01/26/2020, 01/24/2021   Influenza Whole 02/16/2007, 02/27/2008   Influenza, High Dose Seasonal PF 03/09/2016, 02/26/2018, 06/29/2018, 01/15/2023   Influenza,inj,Quad PF,6+ Mos 05/17/2014   Influenza-Unspecified 01/12/2022   PFIZER(Purple Top)SARS-COV-2 Vaccination 06/12/2019, 07/03/2019, 02/10/2020   Pneumococcal Conjugate Pcv21, Polysaccharide Crm197 Conjugaf 06/20/2023   Pneumococcal Conjugate-13 09/27/2013   Pneumococcal Polysaccharide-23 02/27/2008, 10/04/2014   Rotavirus,unspecified  12/27/2021   Td 02/16/2007   Tdap 12/03/2016   Zoster Recombinant(Shingrix) 08/18/2022   Health Maintenance Due  Topic Date Due   Zoster Vaccines- Shingrix (2 of 2) 10/13/2022   COVID-19 Vaccine (4 - 2024-25 season) 12/27/2022      Past  Medical History:  Diagnosis Date   ALLERGIC RHINITIS 02/16/2007   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    BACK PAIN 02/16/2007   COPD (chronic obstructive pulmonary disease) (HCC)    Diverticulosis 09/27/2013   FOOT PAIN, CHRONIC 02/27/2008   GERD (gastroesophageal reflux disease)    Hepatitis C antibody test positive    HERPES ZOSTER, HX OF 02/27/2008   HYPERLIPIDEMIA 02/16/2007   HYPERTENSION 02/16/2007   Personal history of colonic polyps 03/08/2007   PROSTATE SPECIFIC ANTIGEN, ELEVATED 02/16/2007   Shortness of breath dyspnea    Past Surgical History:  Procedure Laterality Date   COLONOSCOPY     FOOT SURGERY     reconstruction x's 4 after work accident    reports that he quit smoking about 39 years ago. His smoking use included cigarettes. He started smoking about 69 years ago. He has a 7.5 pack-year smoking history. He has never used smokeless tobacco. He reports that he does not drink alcohol and does not use drugs. family history includes Alcohol abuse in his brother; Cancer in his mother and sister; Coronary artery disease in his father; Hypertension in his father. No Known Allergies Current Outpatient Medications on File Prior to Visit  Medication Sig Dispense Refill   atenolol (TENORMIN) 25 MG tablet Take 1 tablet (25 mg total) by mouth daily. 90 tablet 3   clonazePAM (KLONOPIN) 0.5 MG tablet TAKE 1 TABLET BY MOUTH TWICE A DAY AS NEEDED  FOR ANXIETY 60 tablet 2   diphenhydrAMINE (BENADRYL) 25 MG tablet Take 1 tablet (25 mg total) by mouth every 6 (six) hours as needed. 20 tablet 0   finasteride (PROSCAR) 5 MG tablet Take 1 tablet (5 mg total) by mouth daily. 90 tablet 3   levocetirizine (XYZAL) 5 MG tablet Take 1 tablet (5 mg total) by mouth daily as needed for allergies. 90 tablet 1   methimazole (TAPAZOLE) 5 MG tablet Take 1 tablet (5 mg total) by mouth 3 (three) times a week. 36 tablet 0   polyethylene glycol powder (GLYCOLAX/MIRALAX) 17 GM/SCOOP powder Take 17 g by mouth daily. 3350  g 1   tamsulosin (FLOMAX) 0.4 MG CAPS capsule Take 0.4 mg by mouth daily.     No current facility-administered medications on file prior to visit.        ROS:  All others reviewed and negative.  Objective        PE:  BP 124/70 (BP Location: Right Arm, Patient Position: Sitting, Cuff Size: Normal)   Pulse 65   Temp 98.3 F (36.8 C) (Oral)   Ht 5\' 6"  (1.676 m)   Wt 180 lb (81.6 kg)   SpO2 96%   BMI 29.05 kg/m                 Constitutional: Pt appears in NAD               HENT: Head: NCAT.                Right Ear: External ear normal.                 Left Ear: External ear normal.                Eyes: . Pupils are equal, round, and reactive to light. Conjunctivae and EOM are normal               Nose: without d/c or deformity               Neck: Neck supple. Gross normal ROM               Cardiovascular: Normal rate and regular rhythm.                 Pulmonary/Chest: Effort normal and breath sounds without rales or wheezing.                Abd:  Soft, NT, ND, + BS, no organomegaly               Neurological: Pt is alert. At baseline orientation, motor grossly intact               Skin: Skin is warm. No rashes, no other new lesions, LE edema - none, has tender area at the left bicep lateral tendon insertion site               Psychiatric: Pt behavior is normal without agitation   Micro: none  Cardiac tracings I have personally interpreted today:  none  Pertinent Radiological findings (summarize): none   Lab Results  Component Value Date   WBC 8.1 06/29/2023   HGB 13.5 06/29/2023   HCT 40.0 06/29/2023   PLT 201.0 06/29/2023   GLUCOSE 105 (H) 06/29/2023   CHOL 159 06/29/2023   TRIG 116.0 06/29/2023   HDL 61.80 06/29/2023   LDLDIRECT 89.0 12/06/2017   LDLCALC 74 06/29/2023   ALT 9 06/29/2023  AST 20 06/29/2023   NA 138 06/29/2023   K 3.7 06/29/2023   CL 104 06/29/2023   CREATININE 1.22 06/29/2023   BUN 16 06/29/2023   CO2 28 06/29/2023   TSH 0.36 06/29/2023    PSA 2.96 06/29/2023   HGBA1C 5.8 06/29/2023   MICROALBUR 1.2 06/29/2023   Assessment/Plan:  Jack [name] is a 78 y.o. Black or African American [2] male with  has a past medical history of ALLERGIC RHINITIS (02/16/2007), Arthritis (2013), BACK PAIN (02/16/2007), COPD (chronic obstructive pulmonary disease) (HCC), Diverticulosis (09/27/2013), FOOT PAIN, CHRONIC (02/27/2008), GERD (gastroesophageal reflux disease), Hepatitis C antibody test positive, HERPES ZOSTER, HX OF (02/27/2008), HYPERLIPIDEMIA (02/16/2007), HYPERTENSION (02/16/2007), Personal history of colonic polyps (03/08/2007), PROSTATE SPECIFIC ANTIGEN, ELEVATED (02/16/2007), and Shortness of breath dyspnea.  Encounter for well adult exam with abnormal findings Age and sex appropriate education and counseling updated with regular exercise and diet Referrals for preventative services - none needed Immunizations addressed - for covid booster, shingrix at pharmacy Smoking counseling  - none needed Evidence for depression or other mood disorder - none significant Most recent labs reviewed. I have personally reviewed and have noted: 1) the patient's medical and social history 2) The patient's current medications and supplements 3) The patient's height, weight, and BMI have been recorded in the chart   Constipation Mild to mod, for lactulose prn,  to f/u any worsening symptoms or concerns  Essential hypertension BP Readings from Last 3 Encounters:  06/29/23 124/70  12/07/22 138/72  11/15/22 (!) 176/86   Stable, pt to continue medical treatment tenormin 25 every day, hyzaar 50 12.5 qd   Hyperglycemia Lab Results  Component Value Date   HGBA1C 5.8 06/29/2023   Stable, pt to continue current medical treatment  - diet, wt control   Hyperlipidemia Lab Results  Component Value Date   LDLCALC 74 06/29/2023   Uncontrolled, goal ldl < 70, pt to continue current statin crestor 40 every day, dcliens add zetia fo rnow   Left  elbow pain Exam c/w bicep  tendonitis, for volt gel prn  Vitamin D deficiency Last vitamin D Lab Results  Component Value Date   VD25OH 26.94 (L) 06/29/2023   Low, to start oral replacement  Followup: Return in about 6 months (around 12/30/2023).  Oliver Barre, MD 07/01/2023 9:08 PM [facility]

## 2023-07-01 ENCOUNTER — Encounter: Payer: Self-pay | Admitting: Internal Medicine

## 2023-07-01 DIAGNOSIS — M25522 Pain in left elbow: Secondary | ICD-10-CM | POA: Insufficient documentation

## 2023-07-01 NOTE — Assessment & Plan Note (Signed)
 Age and sex appropriate education and counseling updated with regular exercise and diet Referrals for preventative services - none needed Immunizations addressed - for covid booster, shingrix at pharmacy Smoking counseling  - none needed Evidence for depression or other mood disorder - none significant Most recent labs reviewed. I have personally reviewed and have noted: 1) the patient's medical and social history 2) The patient's current medications and supplements 3) The patient's height, weight, and BMI have been recorded in the chart

## 2023-07-01 NOTE — Assessment & Plan Note (Signed)
 Last vitamin D Lab Results  Component Value Date   VD25OH 26.94 (L) 06/29/2023   Low, to start oral replacement

## 2023-07-01 NOTE — Assessment & Plan Note (Signed)
 Exam c/w bicep  tendonitis, for volt gel prn

## 2023-07-01 NOTE — Assessment & Plan Note (Signed)
 BP Readings from Last 3 Encounters:  06/29/23 124/70  12/07/22 138/72  11/15/22 (!) 176/86   Stable, pt to continue medical treatment tenormin 25 every day, hyzaar 50 12.5 qd

## 2023-07-01 NOTE — Assessment & Plan Note (Signed)
 Lab Results  Component Value Date   HGBA1C 5.8 06/29/2023   Stable, pt to continue current medical treatment  - diet, wt control

## 2023-07-01 NOTE — Assessment & Plan Note (Signed)
Mild to mod, for lactulose prn,  to f/u any worsening symptoms or concerns

## 2023-07-01 NOTE — Assessment & Plan Note (Signed)
 Lab Results  Component Value Date   LDLCALC 74 06/29/2023   Uncontrolled, goal ldl < 70, pt to continue current statin crestor 40 every day, dcliens add zetia fo rnow

## 2023-08-03 DIAGNOSIS — H2513 Age-related nuclear cataract, bilateral: Secondary | ICD-10-CM | POA: Diagnosis not present

## 2023-08-03 DIAGNOSIS — H40013 Open angle with borderline findings, low risk, bilateral: Secondary | ICD-10-CM | POA: Diagnosis not present

## 2023-08-03 DIAGNOSIS — H18413 Arcus senilis, bilateral: Secondary | ICD-10-CM | POA: Diagnosis not present

## 2023-08-03 DIAGNOSIS — H25043 Posterior subcapsular polar age-related cataract, bilateral: Secondary | ICD-10-CM | POA: Diagnosis not present

## 2023-08-03 DIAGNOSIS — H2511 Age-related nuclear cataract, right eye: Secondary | ICD-10-CM | POA: Diagnosis not present

## 2023-08-14 ENCOUNTER — Other Ambulatory Visit: Payer: Self-pay | Admitting: Internal Medicine

## 2023-08-16 ENCOUNTER — Other Ambulatory Visit: Payer: Self-pay

## 2023-08-26 ENCOUNTER — Other Ambulatory Visit: Payer: Self-pay | Admitting: Internal Medicine

## 2023-09-01 ENCOUNTER — Encounter (HOSPITAL_BASED_OUTPATIENT_CLINIC_OR_DEPARTMENT_OTHER): Payer: Self-pay | Admitting: Emergency Medicine

## 2023-09-01 ENCOUNTER — Emergency Department (HOSPITAL_BASED_OUTPATIENT_CLINIC_OR_DEPARTMENT_OTHER)
Admission: EM | Admit: 2023-09-01 | Discharge: 2023-09-01 | Disposition: A | Discharge status: Home / Self Care | Attending: Emergency Medicine | Admitting: Emergency Medicine

## 2023-09-01 ENCOUNTER — Other Ambulatory Visit: Payer: Self-pay

## 2023-09-01 ENCOUNTER — Emergency Department (HOSPITAL_BASED_OUTPATIENT_CLINIC_OR_DEPARTMENT_OTHER): Admitting: Radiology

## 2023-09-01 DIAGNOSIS — M5441 Lumbago with sciatica, right side: Secondary | ICD-10-CM | POA: Insufficient documentation

## 2023-09-01 DIAGNOSIS — I10 Essential (primary) hypertension: Secondary | ICD-10-CM | POA: Diagnosis not present

## 2023-09-01 DIAGNOSIS — Z79899 Other long term (current) drug therapy: Secondary | ICD-10-CM | POA: Diagnosis not present

## 2023-09-01 DIAGNOSIS — X501XXA Overexertion from prolonged static or awkward postures, initial encounter: Secondary | ICD-10-CM | POA: Insufficient documentation

## 2023-09-01 DIAGNOSIS — M545 Low back pain, unspecified: Secondary | ICD-10-CM | POA: Diagnosis not present

## 2023-09-01 MED ORDER — ACETAMINOPHEN 500 MG PO TABS
1000.0000 mg | ORAL_TABLET | Freq: Once | ORAL | Status: AC
  Administered 2023-09-01: 1000 mg via ORAL
  Filled 2023-09-01: qty 2

## 2023-09-01 MED ORDER — LIDOCAINE 5 % EX PTCH
1.0000 | MEDICATED_PATCH | CUTANEOUS | Status: DC
  Administered 2023-09-01: 1 via TRANSDERMAL
  Filled 2023-09-01: qty 1

## 2023-09-01 MED ORDER — LIDOCAINE 5 % EX PTCH: 1.0000 | MEDICATED_PATCH | CUTANEOUS | 0 refills | Status: AC

## 2023-09-01 MED ORDER — METHYLPREDNISOLONE 4 MG PO TBPK: ORAL_TABLET | ORAL | 0 refills | Status: AC

## 2023-09-01 NOTE — ED Notes (Signed)
 AVS given,questions answered satisfactorily.Discharged ambulatory.

## 2023-09-01 NOTE — ED Triage Notes (Signed)
 Pt reports twisting when picking up a drop cord x 2 weeks pta, c/o lower back pain intermittently

## 2023-09-01 NOTE — Discharge Instructions (Signed)
 It was a pleasure taking care of you today.  You were evaluated in the ER for low back pain after a twisting injury recently.  Your x-ray showed degenerative changes but no fracture.  You were given Tylenol  and a lidocaine  patch in the ER.  You can continue taking over-the-counter Tylenol  as directed on the packaging at home and I have also prescribed lidocaine  patches.  I am also prescribing a steroid pack which can help with the inflammation.  Make sure you take this with food.  Follow-up with your primary care doctor and/or the back specialist.  Come back to the ER for new or worsening symptoms.

## 2023-09-01 NOTE — ED Provider Notes (Signed)
 [place] EMERGENCY DEPARTMENT AT [facility] Provider Note   CSN: [id number] Arrival date & time: 09/01/23  1044     History  Chief Complaint  Patient presents with   Back Pain    Jack [name] is a 78 y.o. male.  He has history of high blood pressure, high cholesterol, right-sided sciatica, aortic atherosclerosis.  Presents the ER for low back pain.  States that started 2 weeks ago when he was bending over picking up an extension cord and he twisted suddenly and had sharp pain in the middle of his lower back.  He states he had intermittent pain especially with movement since then, he has pain rating into the right posterior hip when pushing off with his right leg or lifting his right leg off the bed when sitting or laying down.  He denies any abdominal pain.  Denies fever or chills, no saddle esthesia, no paresthesia, no bowel or bladder incontinence, no weakness in his extremities.  No unintentional weight loss.  He does not use IV drugs.  He has been taking over-the-counter ibuprofen and states it helps but the pain returns when it wears off.   Back Pain      Home Medications Prior to Admission medications   Medication Sig Start Date End Date Taking? Authorizing Provider  lidocaine  (LIDODERM ) 5 % Place 1 patch onto the skin daily. Remove & Discard patch within 12 hours or as directed by MD 09/01/23  Yes Latricia Poles, Shauntay Brunelli A, PA-C  methylPREDNISolone  (MEDROL  DOSEPAK) 4 MG TBPK tablet Take as directed on packaging 09/01/23  Yes Ashayla Subia A, PA-C  albuterol  (VENTOLIN  HFA) 108 (90 Base) MCG/ACT inhaler Inhale 1-2 puffs into the lungs every 6 (six) hours as needed for wheezing or shortness of breath. 06/29/23   Roslyn Coombe, MD  atenolol  (TENORMIN ) 25 MG tablet Take 1 tablet (25 mg total) by mouth daily. 12/06/17   Roslyn Coombe, MD  citalopram  (CELEXA ) 20 MG tablet Take 1 tablet (20 mg total) by mouth daily. 06/29/23   Roslyn Coombe, MD  clonazePAM  (KLONOPIN ) 0.5 MG tablet TAKE 1  TABLET BY MOUTH TWICE A DAY AS NEEDED FOR ANXIETY 06/14/23   Roslyn Coombe, MD  diphenhydrAMINE  (BENADRYL ) 25 MG tablet Take 1 tablet (25 mg total) by mouth every 6 (six) hours as needed. 05/26/21   Geiple, Joshua, PA-C  famotidine  (PEPCID ) 20 MG tablet Take 1 tablet (20 mg total) by mouth at bedtime. 06/29/23   Roslyn Coombe, MD  finasteride  (PROSCAR ) 5 MG tablet Take 1 tablet (5 mg total) by mouth daily. 12/06/17   Roslyn Coombe, MD  lactulose  (CHRONULAC ) 10 GM/15ML solution TAKE 45 MLS (30 G TOTAL) BY MOUTH DAILY AS NEEDED FOR MILD CONSTIPATION. 08/26/23   Roslyn Coombe, MD  levocetirizine (XYZAL ) 5 MG tablet Take 1 tablet (5 mg total) by mouth daily as needed for allergies. 06/01/16   Roslyn Coombe, MD  losartan -hydrochlorothiazide  (HYZAAR) 50-12.5 MG tablet Take 1 tablet by mouth daily. 06/29/23   Roslyn Coombe, MD  methimazole  (TAPAZOLE ) 5 MG tablet Take 1 tablet (5 mg total) by mouth 3 (three) times a week. 05/13/22   Shamleffer, Ibtehal Jaralla, MD  pantoprazole  (PROTONIX ) 40 MG tablet Take 1 tablet (40 mg total) by mouth 2 (two) times daily. 06/29/23   Roslyn Coombe, MD  polyethylene glycol powder (GLYCOLAX /MIRALAX ) 17 GM/SCOOP powder Take 17 g by mouth daily. 08/18/19   Roslyn Coombe, MD  rosuvastatin  (CRESTOR ) 40 MG  tablet Take 1 tablet (40 mg total) by mouth daily. 06/29/23   Roslyn Coombe, MD  tamsulosin (FLOMAX) 0.4 MG CAPS capsule Take 0.4 mg by mouth daily. 06/12/23   [provider]  triamcinolone  (NASACORT ) 55 MCG/ACT AERO nasal inhaler Place 2 sprays into the nose daily. 06/29/23   Roslyn Coombe, MD      Allergies    Patient has no known allergies.    Review of Systems   Review of Systems  Musculoskeletal:  Positive for back pain.    Physical Exam Updated Vital Signs BP (!) 147/80   Pulse (!) 49   Temp 98.2 F (36.8 C) (Oral)   Resp 14   Wt 81.2 kg   SpO2 98%   BMI 28.89 kg/m  Physical Exam Vitals and nursing note reviewed.  Constitutional:      General: He is not in  acute distress.    Appearance: He is well-developed.  HENT:     Head: Normocephalic and atraumatic.  Eyes:     Extraocular Movements: Extraocular movements intact.     Conjunctiva/sclera: Conjunctivae normal.     Pupils: Pupils are equal, round, and reactive to light.  Cardiovascular:     Rate and Rhythm: Normal rate and regular rhythm.     Heart sounds: No murmur heard. Pulmonary:     Effort: Pulmonary effort is normal. No respiratory distress.     Breath sounds: Normal breath sounds.  Abdominal:     Palpations: Abdomen is soft.     Tenderness: There is no abdominal tenderness.  Musculoskeletal:        General: No swelling.     Cervical back: Neck supple.     Lumbar back: Tenderness present. No swelling. Positive right straight leg raise test. Negative left straight leg raise test.       Back:  Skin:    General: Skin is warm and dry.     Capillary Refill: Capillary refill takes less than 2 seconds.  Neurological:     General: No focal deficit present.     Mental Status: He is alert and oriented to person, place, and time.     Motor: No weakness.     Deep Tendon Reflexes: Reflexes normal.     Comments: Antalgic gait   Psychiatric:        Mood and Affect: Mood normal.     ED Results / Procedures / Treatments   Labs (all labs ordered are listed, but only abnormal results are displayed) Labs Reviewed - No data to display  EKG None  Radiology DG Lumbar Spine Complete Result Date: 09/01/2023 CLINICAL DATA:  Low back pain. EXAM: LUMBAR SPINE - COMPLETE 4+ VIEW COMPARISON:  None Available. FINDINGS: Five non-rib-bearing lumbar vertebra. Normal lumbar alignment. Normal vertebral body heights. No fracture or compression deformity. Anterior spurring at multiple levels with relative preservation of disc spaces. There is mild lower lumbar facet hypertrophy. No evidence of pars defects or focal bone abnormality. The sacroiliac joints are congruent. IMPRESSION: Mild degenerative  spurring and lower lumbar facet hypertrophy. Electronically Signed   By: Chadwick Colonel M.D.   On: 09/01/2023 13:13    Procedures Procedures    Medications Ordered in ED Medications  lidocaine  (LIDODERM ) 5 % 1 patch (1 patch Transdermal Patch Applied 09/01/23 1204)  acetaminophen  (TYLENOL ) tablet 1,000 mg (1,000 mg Oral Given 09/01/23 1204)    ED Course/ Medical Decision Making/ A&P  Medical Decision Making This patient presents to the ED for concern of back pain this involves an extensive number of treatment options, and is a complaint that carries with it a high risk of complications and morbidity.  The differential diagnosis includes sprain, strain, HNP, fracture, DDD, muscle spasm, cauda equina, epidural abscess or hematoma, malignancy, other   Co morbidities that complicate the patient evaluation  HTN, high cholesterol, BPH   Additional history obtained:  Additional history obtained from EMR External records from outside source obtained and reviewed including prior notes      Imaging Studies ordered:  I ordered imaging studies including a lumbar spine  I independently visualized and interpreted imaging which showed fracture or traumatic malalignment lower lumbar facet hypertrophy I agree with the radiologist interpretation      Problem List / ED Course / Critical interventions / Medication management  Patient presents with low back pain numbness 2 weeks in the setting of a twisting injury. Differential considered as above. Symptoms are likely due to muscle strain versus degenerative changes. They have no high risk features including saddle anesthesia/paresthesia, bowel or bladder incontinence, urinary retention, fever, weight loss, history of cancer or immune suppression, or IV drug use. We discussed plan to treat symptoms with lidocaine  patch, steroids and OTC acetaminophen , and follow up with ECP and/or neurosurgery. They were advised  on strict return precautions.  I ordered medication including Tylenol  and Lidoderm  patch for low back pain Reevaluation of the patient after these medicines showed that the patient improved I have reviewed the patients home medicines and have made adjustments as needed     Amount and/or Complexity of Data Reviewed Radiology: ordered.  Risk OTC drugs. Prescription drug management.           Final Clinical Impression(s) / ED Diagnoses Final diagnoses:  Midline low back pain with right-sided sciatica, unspecified chronicity    Rx / DC Orders ED Discharge Orders          Ordered    lidocaine  (LIDODERM ) 5 %  Every 24 hours        09/01/23 1329    methylPREDNISolone  (MEDROL  DOSEPAK) 4 MG TBPK tablet        09/01/23 [address] PA-C 09/01/23 1348    Trish Furl, MD 09/01/23 1558

## 2023-09-15 ENCOUNTER — Ambulatory Visit (INDEPENDENT_AMBULATORY_CARE_PROVIDER_SITE_OTHER): Admitting: Internal Medicine

## 2023-09-15 ENCOUNTER — Encounter: Payer: Self-pay | Admitting: Internal Medicine

## 2023-09-15 VITALS — BP 120/70 | HR 61 | Temp 98.4°F | Ht 66.0 in | Wt 175.6 lb

## 2023-09-15 DIAGNOSIS — I10 Essential (primary) hypertension: Secondary | ICD-10-CM

## 2023-09-15 DIAGNOSIS — Z1211 Encounter for screening for malignant neoplasm of colon: Secondary | ICD-10-CM

## 2023-09-15 DIAGNOSIS — M5440 Lumbago with sciatica, unspecified side: Secondary | ICD-10-CM | POA: Diagnosis not present

## 2023-09-15 DIAGNOSIS — R739 Hyperglycemia, unspecified: Secondary | ICD-10-CM

## 2023-09-15 DIAGNOSIS — E559 Vitamin D deficiency, unspecified: Secondary | ICD-10-CM

## 2023-09-15 MED ORDER — CYCLOBENZAPRINE HCL 5 MG PO TABS: 5.0000 mg | ORAL_TABLET | Freq: Three times a day (TID) | ORAL | 1 refills | Status: AC | PRN

## 2023-09-15 NOTE — Patient Instructions (Addendum)
 Please take all new medication as prescribed -the muscle relaxer as needed  Please continue all other medications as before, and refills have been done if requested.  Please have the pharmacy call with any other refills you may need.  Please keep your appointments with your specialists as you may have planned  You will be contacted regarding the referral for: colonoscopy  Please make an Appointment to return in 3 months, or sooner if needed

## 2023-09-15 NOTE — Progress Notes (Signed)
 Patient ID: Jack [name], male   DOB: [birth year], 78 y.o.   MRN: [id number]        Chief Complaint: follow up low back pain, htn, hyperglycemia, low vit d       HPI:  Jack [name] is a 78 y.o. male here with c/o acute onset 2 wks midlline LBP with some radiation to the left buttock, mod to severe, not clear what caused it, no falls, and without bowel or bladder change, fever, wt loss,  worsening LE pain/numbness/weakness, gait change or falls. Was seen in ED may 7 with LS spine films with mild degenerative changes only. Overall improved from that visit, but still persistent.  Pt denies chest pain, increased sob or doe, wheezing, orthopnea, PND, increased LE swelling, palpitations, dizziness or syncope.   Pt denies polydipsia, polyuria, or new focal neuro s/s.  Also due for colonoscopy   Pt is somewhat difficult historian due to education level and hearing reduced it seems.        Wt Readings from Last 3 Encounters:  09/15/23 175 lb 9.6 oz (79.7 kg)  09/01/23 179 lb (81.2 kg)  06/29/23 180 lb (81.6 kg)   BP Readings from Last 3 Encounters:  09/15/23 120/70  09/01/23 (!) 147/80  06/29/23 124/70         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    BACK PAIN 02/16/2007   COPD (chronic obstructive pulmonary disease) (HCC)    Diverticulosis 09/27/2013   FOOT PAIN, CHRONIC 02/27/2008   GERD (gastroesophageal reflux disease)    Hepatitis C antibody test positive    HERPES ZOSTER, HX OF 02/27/2008   HYPERLIPIDEMIA 02/16/2007   HYPERTENSION 02/16/2007   Personal history of colonic polyps 03/08/2007   PROSTATE SPECIFIC ANTIGEN, ELEVATED 02/16/2007   Shortness of breath dyspnea    Past Surgical History:  Procedure Laterality Date   COLONOSCOPY     FOOT SURGERY     reconstruction x's 4 after work accident    reports that he quit smoking about 39 years ago. His smoking use included cigarettes. He started smoking about 69 years ago. He  has a 7.5 pack-year smoking history. He has never used smokeless tobacco. He reports that he does not drink alcohol and does not use drugs. family history includes Alcohol abuse in his brother; Cancer in his mother and sister; Coronary artery disease in his father; Hypertension in his father. No Known Allergies Current Outpatient Medications on File Prior to Visit  Medication Sig Dispense Refill   albuterol  (VENTOLIN  HFA) 108 (90 Base) MCG/ACT inhaler Inhale 1-2 puffs into the lungs every 6 (six) hours as needed for wheezing or shortness of breath. 25.5 each 11   atenolol  (TENORMIN ) 25 MG tablet Take 1 tablet (25 mg total) by mouth daily. 90 tablet 3   citalopram  (CELEXA ) 20 MG tablet Take 1 tablet (20 mg total) by mouth daily. 90 tablet 3   clonazePAM  (KLONOPIN ) 0.5 MG tablet TAKE 1 TABLET BY MOUTH TWICE A DAY AS NEEDED FOR ANXIETY 60 tablet 2   diphenhydrAMINE  (BENADRYL ) 25 MG tablet Take 1 tablet (25 mg total) by mouth every 6 (six) hours as needed. 20 tablet 0   famotidine  (PEPCID ) 20 MG tablet Take 1 tablet (20 mg total) by mouth at bedtime. 90 tablet 3   finasteride  (PROSCAR ) 5 MG tablet Take 1 tablet (5 mg total) by mouth daily. 90 tablet 3   lactulose  (CHRONULAC ) 10  GM/15ML solution TAKE 45 MLS (30 G TOTAL) BY MOUTH DAILY AS NEEDED FOR MILD CONSTIPATION. 2838 mL 1   levocetirizine (XYZAL ) 5 MG tablet Take 1 tablet (5 mg total) by mouth daily as needed for allergies. 90 tablet 1   lidocaine  (LIDODERM ) 5 % Place 1 patch onto the skin daily. Remove & Discard patch within 12 hours or as directed by MD 30 patch 0   losartan -hydrochlorothiazide  (HYZAAR) 50-12.5 MG tablet Take 1 tablet by mouth daily. 90 tablet 3   methimazole  (TAPAZOLE ) 5 MG tablet Take 1 tablet (5 mg total) by mouth 3 (three) times a week. 36 tablet 0   methylPREDNISolone  (MEDROL  DOSEPAK) 4 MG TBPK tablet Take as directed on packaging 1 each 0   pantoprazole  (PROTONIX ) 40 MG tablet Take 1 tablet (40 mg total) by mouth 2 (two)  times daily. 180 tablet 3   polyethylene glycol powder (GLYCOLAX /MIRALAX ) 17 GM/SCOOP powder Take 17 g by mouth daily. 3350 g 1   rosuvastatin  (CRESTOR ) 40 MG tablet Take 1 tablet (40 mg total) by mouth daily. 90 tablet 3   tamsulosin (FLOMAX) 0.4 MG CAPS capsule Take 0.4 mg by mouth daily.     triamcinolone  (NASACORT ) 55 MCG/ACT AERO nasal inhaler Place 2 sprays into the nose daily. 3 each 3   No current facility-administered medications on file prior to visit.        ROS:  All others reviewed and negative.  Objective        PE:  BP 120/70 (BP Location: Left Arm, Patient Position: Sitting, Cuff Size: Normal)   Pulse 61   Temp 98.4 F (36.9 C) (Oral)   Ht 5\' 6"  (1.676 m)   Wt 175 lb 9.6 oz (79.7 kg)   SpO2 96%   BMI 28.34 kg/m                 Constitutional: Pt appears in NAD               HENT: Head: NCAT.                Right Ear: External ear normal.                 Left Ear: External ear normal.                Eyes: . Pupils are equal, round, and reactive to light. Conjunctivae and EOM are normal               Nose: without d/c or deformity               Neck: Neck supple. Gross normal ROM               Cardiovascular: Normal rate and regular rhythm.                 Pulmonary/Chest: Effort normal and breath sounds without rales or wheezing.                Abd:  Soft, NT, ND, + BS, no organomegaly                Spine with mild tenderness mostly midline lowest lumbar but also has some mild bilateral left > right paraspinal spasm tenderness               Neurological: Pt is alert. At baseline orientation, motor grossly intact               Skin: Skin is  warm. No rashes, no other new lesions, LE edema - none               Psychiatric: Pt behavior is normal without agitation   Micro: none  Cardiac tracings I have personally interpreted today:  none  Pertinent Radiological findings (summarize): none   Lab Results  Component Value Date   WBC 8.1 06/29/2023   HGB 13.5  06/29/2023   HCT 40.0 06/29/2023   PLT 201.0 06/29/2023   GLUCOSE 105 (H) 06/29/2023   CHOL 159 06/29/2023   TRIG 116.0 06/29/2023   HDL 61.80 06/29/2023   LDLDIRECT 89.0 12/06/2017   LDLCALC 74 06/29/2023   ALT 9 06/29/2023   AST 20 06/29/2023   NA 138 06/29/2023   K 3.7 06/29/2023   CL 104 06/29/2023   CREATININE 1.22 06/29/2023   BUN 16 06/29/2023   CO2 28 06/29/2023   TSH 0.36 06/29/2023   PSA 2.96 06/29/2023   HGBA1C 5.8 06/29/2023   MICROALBUR 1.2 06/29/2023   Assessment/Plan:  Jack [name] is a 78 y.o. Black or African American [2] male with  has a past medical history of ALLERGIC RHINITIS (02/16/2007), Arthritis (2013), BACK PAIN (02/16/2007), COPD (chronic obstructive pulmonary disease) (HCC), Diverticulosis (09/27/2013), FOOT PAIN, CHRONIC (02/27/2008), GERD (gastroesophageal reflux disease), Hepatitis C antibody test positive, HERPES ZOSTER, HX OF (02/27/2008), HYPERLIPIDEMIA (02/16/2007), HYPERTENSION (02/16/2007), Personal history of colonic polyps (03/08/2007), PROSTATE SPECIFIC ANTIGEN, ELEVATED (02/16/2007), and Shortness of breath dyspnea.  Essential hypertension BP Readings from Last 3 Encounters:  09/15/23 120/70  09/01/23 (!) 147/80  06/29/23 124/70   Stable, pt to continue medical treatment tenormin  25 every day, hyzaar 50 12.5 mg qd   Hyperglycemia Lab Results  Component Value Date   HGBA1C 5.8 06/29/2023   Stable, pt to continue current medical treatment  - diet, wt control   Vitamin D  deficiency Last vitamin D  Lab Results  Component Value Date   VD25OH 26.94 (L) 06/29/2023   Low, to start oral replacement   Low back pain Acute onset likely combination of lumbar ddd djd (mild per xray) and now bilateral mild paraspinal spasm with no LE symptoms,- for flexeril 5 tid prn, and consider refer PT if does not continue to improve   Screen for colon cancer Also for colonoscopy as is due  Followup: Return in about 3 months (around  12/16/2023).  Rosalia Colonel, MD 09/16/2023 6:37 AM [facility]

## 2023-09-16 ENCOUNTER — Encounter: Payer: Self-pay | Admitting: Internal Medicine

## 2023-09-16 DIAGNOSIS — Z1211 Encounter for screening for malignant neoplasm of colon: Secondary | ICD-10-CM | POA: Insufficient documentation

## 2023-09-16 NOTE — Assessment & Plan Note (Signed)
Also for colonoscopy as is due 

## 2023-09-16 NOTE — Assessment & Plan Note (Signed)
 Acute onset likely combination of lumbar ddd djd (mild per xray) and now bilateral mild paraspinal spasm with no LE symptoms,- for flexeril 5 tid prn, and consider refer PT if does not continue to improve

## 2023-09-16 NOTE — Assessment & Plan Note (Signed)
 Last vitamin D Lab Results  Component Value Date   VD25OH 26.94 (L) 06/29/2023   Low, to start oral replacement

## 2023-09-16 NOTE — Assessment & Plan Note (Signed)
 BP Readings from Last 3 Encounters:  09/15/23 120/70  09/01/23 (!) 147/80  06/29/23 124/70   Stable, pt to continue medical treatment tenormin  25 every day, hyzaar 50 12.5 mg qd

## 2023-09-16 NOTE — Assessment & Plan Note (Signed)
 Lab Results  Component Value Date   HGBA1C 5.8 06/29/2023   Stable, pt to continue current medical treatment  - diet, wt control

## 2023-09-24 DIAGNOSIS — H2511 Age-related nuclear cataract, right eye: Secondary | ICD-10-CM | POA: Diagnosis not present

## 2023-09-24 DIAGNOSIS — H2512 Age-related nuclear cataract, left eye: Secondary | ICD-10-CM | POA: Diagnosis not present

## 2023-10-11 ENCOUNTER — Encounter: Payer: Self-pay | Admitting: Internal Medicine

## 2023-10-13 DIAGNOSIS — H43813 Vitreous degeneration, bilateral: Secondary | ICD-10-CM | POA: Diagnosis not present

## 2023-10-15 ENCOUNTER — Telehealth: Payer: Self-pay

## 2023-10-15 NOTE — Telephone Encounter (Signed)
 I was unaware that the patient had a pre-visit appointment. Instruction have been created. Patient did not receive the instructions, a pre-visit appointment reminder form was printed and given to the patient. He will come for his pre-visit appointment on 11/02/23 at 2:00pm.

## 2023-10-19 ENCOUNTER — Telehealth: Payer: Self-pay | Admitting: Internal Medicine

## 2023-10-19 NOTE — Telephone Encounter (Signed)
 Lab Results  Component Value Date   PSA 2.96 06/29/2023   PSA 3.51 12/07/2022   PSA 2.79 04/30/2021   Last lab was just done mar 2025 - no need for repeat until next yr.   thanks

## 2023-10-19 NOTE — Telephone Encounter (Signed)
 Called and let Pt know

## 2023-10-19 NOTE — Telephone Encounter (Signed)
 Patient would like to know if it is too soon to get a prostate exam. He is unsure when his last one was. Patient would like a call back at [phone].

## 2023-10-25 DIAGNOSIS — H2512 Age-related nuclear cataract, left eye: Secondary | ICD-10-CM | POA: Diagnosis not present

## 2023-10-25 DIAGNOSIS — H25042 Posterior subcapsular polar age-related cataract, left eye: Secondary | ICD-10-CM | POA: Diagnosis not present

## 2023-10-25 DIAGNOSIS — H25012 Cortical age-related cataract, left eye: Secondary | ICD-10-CM | POA: Diagnosis not present

## 2023-11-02 ENCOUNTER — Telehealth: Payer: Self-pay

## 2023-11-02 ENCOUNTER — Encounter

## 2023-11-02 NOTE — Telephone Encounter (Signed)
 RN unsuccessful in attempting to call patient for pre-visit appointment. RN has cancelled patient's colonoscopy. Letter has been mailed to patient notifying him of the cancellation.

## 2023-11-10 ENCOUNTER — Encounter: Payer: Self-pay | Admitting: Internal Medicine

## 2023-11-30 ENCOUNTER — Encounter: Admitting: Internal Medicine

## 2023-12-13 ENCOUNTER — Ambulatory Visit

## 2023-12-13 ENCOUNTER — Telehealth: Payer: Self-pay | Admitting: *Deleted

## 2023-12-13 NOTE — Progress Notes (Unsigned)
 SABRA

## 2023-12-13 NOTE — Telephone Encounter (Signed)
 Verifeid with 2 ID's Pt states the reason he scheduled procedure is because of constipation and gas. He stated he spoke with PCP about issue and PCP asked him if he wanted a colonoscopy and pt stated yes.  Discussed with pt that due to his age it may be best to see the MD or  NP in an office visit to make the best decision if colonoscopy is the best choice. Pt stated he understood and agreed to make an OV. OV made fro Oct.14 th at 9:50 am with Jessica Zehr. Instructed pt to come 15 min early. Gave main office # to pt and had pt repeat back information given.

## 2023-12-28 ENCOUNTER — Telehealth: Payer: Self-pay

## 2023-12-28 NOTE — Telephone Encounter (Signed)
 Copied from CRM #[id number]. Topic: General - Other >> Dec 28, 2023  7:45 AM Pinkey ORN wrote: Reason for CRM: Question For Norleen Lynwood ORN, MD >> Dec 28, 2023  7:47 AM Pinkey ORN wrote: Patient called in wanting to know if it's time for his colonoscopy. Please follow up with patient at [phone]

## 2023-12-28 NOTE — Telephone Encounter (Signed)
 Copied from CRM [id number]. Topic: Appointments - Scheduling Inquiry for Clinic >> Dec 28, 2023  8:40 AM Jack [name] wrote: Reason for CRM:  Patient representative: Jack [name] - verified by Patient (sister in law) called inquiring about a letter the patient stated he received -  was given 11/30/2023  regarding and upcoming Endoscopy appointment.   They are requesting clarification on behalf of the patient as to why the patient needs this procedure and why it was scheduled.  I informed her it appeared the patient had requested this appointment on his own but missed the date/time.  I verified he does have a new patient consult with [facility] on 02/08/2024.   Patients representative and family are requesting a call back from the PCP/PCP team to see why the appointment has been scheduled if the patient stated he is having no more issues (In the appointment notes it states he is still having issues)   PCP/PCP Team Please Advise.   Jack [name] Callback Telephone Number: [phone])

## 2023-12-29 ENCOUNTER — Encounter: Admitting: Internal Medicine

## 2023-12-29 NOTE — Telephone Encounter (Signed)
 Addressing in separate telephone note. Awaiting PCP response and will contact patient and family

## 2023-12-29 NOTE — Telephone Encounter (Signed)
 No, this should be ok as he is not over 80.  I would encourage him to contact GI to have this done.   thanks

## 2024-01-03 NOTE — Telephone Encounter (Signed)
 Called and spoke with patient's sister. Informed her of the reasoning for the GI referral as well as concerns from Dr.Dontrey. They expressed understanding and will follow up with GI in October

## 2024-01-08 ENCOUNTER — Other Ambulatory Visit: Payer: Self-pay | Admitting: Internal Medicine

## 2024-02-08 ENCOUNTER — Other Ambulatory Visit: Payer: Self-pay | Admitting: Internal Medicine

## 2024-02-08 ENCOUNTER — Encounter: Payer: Self-pay | Admitting: Gastroenterology

## 2024-02-08 ENCOUNTER — Ambulatory Visit: Admitting: Gastroenterology

## 2024-02-08 VITALS — BP 114/58 | HR 76 | Ht 66.0 in | Wt 181.2 lb

## 2024-02-08 DIAGNOSIS — K5900 Constipation, unspecified: Secondary | ICD-10-CM

## 2024-02-08 DIAGNOSIS — Z713 Dietary counseling and surveillance: Secondary | ICD-10-CM

## 2024-02-08 NOTE — Progress Notes (Addendum)
 02/08/2024 Jack [name] [id number] May 25, 1945  Discussed the use of AI scribe software for clinical note transcription with the patient, who gave verbal consent to proceed.  History of Present Illness Jack [name] is a 78 year old male who presents with chronic constipation and gas.  Patient of Dr. Darilyn.  He has been experiencing chronic constipation and gas for an extended period. A previous hospital visit provided interim relief. His family doctor recommended Miralax , which he uses intermittently. Consistent use of Miralax  improves his symptoms, but gas and constipation returns when he stops.  He currently uses Miralax , mixed with orange juice, but not daily. He also uses lactulose  syrup occasionally. Certain foods, such as pinto beans, exacerbate his gas symptoms, especially at night. He enjoys salads but finds excessive consumption increases gas production.  No blood in stool, persistent abdominal pain, or unintentional weight loss. Occasional abdominal pain improves after passing gas or having a bowel movement. His appetite remains good.  Extensive labs earlier this year by PCP look good including TSH.  Colonoscopy 03/2021 with Dr. Avram:  - One diminutive polyp in the transverse colon, removed with a cold snare. Resected and retrieved. - Diverticulosis in the sigmoid colon and in the descending colon. - The examination was otherwise normal on direct and retroflexion views. - Personal history of colonic polyps. 2008 x 3 and 2017 x 2 - no cause of lower abdominal soreness seen - CT was negative in Sept except BPH.  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    BACK PAIN 02/16/2007   COPD (chronic obstructive pulmonary disease) (HCC)    Diverticulosis 09/27/2013   FOOT PAIN, CHRONIC 02/27/2008   GERD (gastroesophageal  reflux disease)    Hepatitis C antibody test positive    HERPES ZOSTER, HX OF 02/27/2008   HYPERLIPIDEMIA 02/16/2007   HYPERTENSION 02/16/2007   Personal history of colonic polyps 03/08/2007   PROSTATE SPECIFIC ANTIGEN, ELEVATED 02/16/2007   Shortness of breath dyspnea    Past Surgical History:  Procedure Laterality Date   COLONOSCOPY     FOOT SURGERY     reconstruction x's 4 after work accident    reports that he quit smoking about 39 years ago. His smoking use included cigarettes. He started smoking about 69 years ago. He has a 7.5 pack-year smoking history. He has never used smokeless tobacco. He reports that he does not drink alcohol and does not use drugs. family history includes Alcohol abuse in his brother; Cancer in his mother and sister; Coronary artery disease in his father; Hypertension in his father. No Known Allergies    Outpatient Encounter Medications as of 02/08/2024  Medication Sig   albuterol  (VENTOLIN  HFA) 108 (90 Base) MCG/ACT inhaler Inhale 1-2 puffs into the lungs every 6 (six) hours as needed for wheezing or shortness of breath.   atenolol  (TENORMIN ) 25 MG tablet Take 1 tablet (25 mg total) by mouth daily.   citalopram  (CELEXA ) 20 MG tablet Take 1 tablet (20 mg total) by mouth daily.   clonazePAM  (KLONOPIN ) 0.5 MG tablet TAKE 1 TABLET BY MOUTH TWICE A DAY AS NEEDED FOR ANXIETY   cyclobenzaprine  (FLEXERIL ) 5 MG tablet Take 1 tablet (5 mg total) by mouth 3 (three) times daily as needed.   diphenhydrAMINE  (BENADRYL ) 25 MG tablet Take 1 tablet (25 mg total) by mouth every  6 (six) hours as needed.   famotidine  (PEPCID ) 20 MG tablet Take 1 tablet (20 mg total) by mouth at bedtime.   finasteride  (PROSCAR ) 5 MG tablet Take 1 tablet (5 mg total) by mouth daily.   lactulose  (CHRONULAC ) 10 GM/15ML solution TAKE 45 MLS (30 G TOTAL) BY MOUTH DAILY AS NEEDED FOR MILD CONSTIPATION.   levocetirizine (XYZAL ) 5 MG tablet Take 1 tablet (5 mg total) by mouth daily as needed for  allergies.   lidocaine  (LIDODERM ) 5 % Place 1 patch onto the skin daily. Remove & Discard patch within 12 hours or as directed by MD   losartan -hydrochlorothiazide  (HYZAAR) 50-12.5 MG tablet Take 1 tablet by mouth daily.   methimazole  (TAPAZOLE ) 5 MG tablet Take 1 tablet (5 mg total) by mouth 3 (three) times a week.   methylPREDNISolone  (MEDROL  DOSEPAK) 4 MG TBPK tablet Take as directed on packaging   pantoprazole  (PROTONIX ) 40 MG tablet Take 1 tablet (40 mg total) by mouth 2 (two) times daily.   polyethylene glycol powder (GLYCOLAX /MIRALAX ) 17 GM/SCOOP powder Take 17 g by mouth daily.   rosuvastatin  (CRESTOR ) 40 MG tablet Take 1 tablet (40 mg total) by mouth daily.   tamsulosin (FLOMAX) 0.4 MG CAPS capsule Take 0.4 mg by mouth daily.   triamcinolone  (NASACORT ) 55 MCG/ACT AERO nasal inhaler Place 2 sprays into the nose daily.   No facility-administered encounter medications on file as of 02/08/2024.    REVIEW OF SYSTEMS  : All other systems reviewed and negative except where noted in the History of Present Illness.   PHYSICAL EXAM: BP (!) 114/58 (BP Location: Right Arm, Patient Position: Sitting, Cuff Size: Normal)   Pulse 76   Ht 5' 6 (1.676 m)   Wt 181 lb 4 oz (82.2 kg)   BMI 29.25 kg/m  General: Well developed AA male in no acute distress Head: Normocephalic and atraumatic Eyes:  Sclerae anicteric, conjunctiva pink. Ears:  HOH. Lungs: Clear throughout to auscultation; no W/R/R. Heart: Regular rate and rhythm; no M/R/G. Abdomen: Soft, non-distended.  BS present.  Non-tender. Musculoskeletal: Symmetrical with no gross deformities  Skin: No lesions on visible extremities Extremities: No edema  Neurological: Alert oriented x 4, grossly non-focal Psychological:  Alert and cooperative. Normal mood and affect  Assessment & Plan Chronic constipation with intestinal gas, improved with Miralax  but recurs without regular use. Lactulose  causes gas. No alarming symptoms. Gas possibly  diet-related. - Recommend daily Miralax  with 8 oz liquid. - Use Gas-X as needed. - Advise dietary modifications to reduce gas, limit high-fiber foods. - Follow-up on an as needed basis.    CC:  Jack Lynwood ORN, MD

## 2024-02-08 NOTE — Patient Instructions (Signed)
 Start Miralax  1 capful daily in 8 ounces of liquid, may increase to twice daily as needed.   Use GasX as needed.   Follow up as needed.   _______________________________________________________  If your blood pressure at your visit was 140/90 or greater, please contact your primary care physician to follow up on this.  _______________________________________________________  If you are age 78 or older, your body mass index should be between 23-30. Your Body mass index is 29.25 kg/m. If this is out of the aforementioned range listed, please consider follow up with your Primary Care Provider.  If you are age 64 or younger, your body mass index should be between 19-25. Your Body mass index is 29.25 kg/m. If this is out of the aformentioned range listed, please consider follow up with your Primary Care Provider.   ________________________________________________________  The [facility] providers would like to encourage you to use MYCHART to communicate with providers for non-urgent requests or questions.  Due to long hold times on the telephone, sending your provider a message by [facility] may be a faster and more efficient way to get a response.  Please allow 48 business hours for a response.  Please remember that this is for non-urgent requests.  _______________________________________________________  [facility] is using a team-based approach to care.  Your team is made up of your doctor and two to three APPS. Our APPS (Nurse Practitioners and Physician Assistants) work with your physician to ensure care continuity for you. They are fully qualified to address your health concerns and develop a treatment plan. They communicate directly with your gastroenterologist to care for you. Seeing the Advanced Practice Practitioners on your physician's team can help you by facilitating care more promptly, often allowing for earlier appointments, access to diagnostic testing, procedures, and  other specialty referrals.

## 2024-02-29 ENCOUNTER — Other Ambulatory Visit: Payer: Self-pay | Admitting: Internal Medicine

## 2024-02-29 ENCOUNTER — Other Ambulatory Visit: Payer: Self-pay

## 2024-03-04 ENCOUNTER — Other Ambulatory Visit: Payer: Self-pay | Admitting: Internal Medicine

## 2024-03-06 ENCOUNTER — Other Ambulatory Visit: Payer: Self-pay

## 2024-03-31 ENCOUNTER — Other Ambulatory Visit: Payer: Self-pay | Admitting: Internal Medicine

## 2024-03-31 MED ORDER — CLONAZEPAM 0.5 MG PO TABS: ORAL_TABLET | ORAL | 2 refills | Status: AC

## 2024-03-31 NOTE — Telephone Encounter (Signed)
 Copied from CRM [id number]. Topic: Clinical - Medication Refill >> Mar 31, 2024 10:16 AM Antwanette L wrote: Medication: clonazePAM  (KLONOPIN ) 0.5 MG tablet  Has the patient contacted their pharmacy? No   This is the patient's preferred pharmacy:  [id number] [place], [place] - [address] [place] [address] Phone: [phone] Fax: [phone]    Is this the correct pharmacy for this prescription? Yes   Has the prescription been filled recently? Yes. Last refill was on 01/10/24  Is the patient out of the medication? No. Patient has 4 pills left  Has the patient been seen for an appointment in the last year OR does the patient have an upcoming appointment? Yes. Pt has an hospital follow up on 09/15/23 with Dr. Lynwood Rush  Can we respond through MyChart? No. Contact the patient by phone at [phone]  Agent: Please be advised that Rx refills may take up to 3 business days. We ask that you follow-up with your pharmacy.

## 2024-05-03 ENCOUNTER — Telehealth: Payer: Self-pay | Admitting: Internal Medicine

## 2024-05-03 ENCOUNTER — Telehealth: Payer: Self-pay | Admitting: Radiology

## 2024-05-03 NOTE — Telephone Encounter (Signed)
 Copied from CRM [id number]. Topic: General - Other >> May 03, 2024  9:57 AM Aleatha BROCKS wrote: Reason for CRM: Patient has a handicap sticker paperwork and needs it signed  please  give call back for further [phone]

## 2024-05-03 NOTE — Telephone Encounter (Signed)
 Patients wife called, states that they dropped off a handicap placard form and she says that it was sent back to them and that it was not approved. I advised pts wife that we would send them a new placard form that was completed by our office.

## 2024-05-03 NOTE — Telephone Encounter (Signed)
 IC patient was a patient of Dr Crist years ago. He has not been seen here within the last 5 years.  Patients wife states he has an upcoming appt with his PCP and will take form with him them.

## 2024-05-04 NOTE — Telephone Encounter (Signed)
 Tried to call Pt twice and line was busy both times , If Pt calls back please let him know he can drop the form off to the office to be filled out.

## 2024-05-08 ENCOUNTER — Telehealth: Payer: Self-pay

## 2024-05-08 ENCOUNTER — Ambulatory Visit: Payer: Medicare Other

## 2024-05-08 VITALS — Ht 66.0 in | Wt 181.0 lb

## 2024-05-08 DIAGNOSIS — Z0000 Encounter for general adult medical examination without abnormal findings: Secondary | ICD-10-CM | POA: Diagnosis not present

## 2024-05-08 NOTE — Telephone Encounter (Signed)
 Copied from CRM #[id number]. Topic: Appointments - Appointment Info/Confirmation >> May 08, 2024 10:58 AM Laymon HERO wrote: Reason for CRM: Patient wife called- needing to contact for appt today on her # [phone]

## 2024-05-08 NOTE — Patient Instructions (Addendum)
 Jack [name],  Thank you for taking the time for your Medicare Wellness Visit. I appreciate your continued commitment to your health goals. Please review the care plan we discussed, and feel free to reach out if I can assist you further.  Please note that Annual Wellness Visits do not include a physical exam. Some assessments may be limited, especially if the visit was conducted virtually. If needed, we may recommend an in-person follow-up with your provider.  Ongoing Care Seeing your primary care provider every 3 to 6 months helps us  monitor your health and provide consistent, personalized care. Last office visit on 09/15/2023.  You are due for a 2nd Shingles vaccine and can get that done at your local pharmacy. Please call the office to get scheduled for your next office visit.  Aim for 30 minutes of exercise or brisk walking, 6-8 glasses of water, and 5 servings of fruits and vegetables each day.   Referrals If a referral was made during today's visit and you haven't received any updates within two weeks, please contact the referred provider directly to check on the status.  Recommended Screenings:  Health Maintenance  Topic Date Due   Zoster (Shingles) Vaccine (2 of 2) 10/13/2022   COVID-19 Vaccine (4 - 2025-26 season) 12/27/2023   Medicare Annual Wellness Visit  05/06/2024   DTaP/Tdap/Td vaccine (3 - Td or Tdap) 12/04/2026   Pneumococcal Vaccine for age over 25  Completed   Flu Shot  Completed   Hepatitis C Screening  Completed   Meningitis B Vaccine  Aged Out   Colon Cancer Screening  Discontinued       05/08/2024   11:01 AM  Advanced Directives  Does Patient Have a Medical Advance Directive? No    Vision: Annual vision screenings are recommended for early detection of glaucoma, cataracts, and diabetic retinopathy. These exams can also reveal signs of chronic conditions such as diabetes and high blood pressure.  Dental: Annual dental screenings help detect early signs of oral  cancer, gum disease, and other conditions linked to overall health, including heart disease and diabetes.  Please see the attached documents for additional preventive care recommendations.

## 2024-05-08 NOTE — Progress Notes (Signed)
 "  Chief Complaint  Patient presents with   Medicare Wellness     Subjective:   Jack [name] is a 79 y.o. male who presents for a Medicare Annual Wellness Visit.  Visit info / Clinical Intake: Medicare Wellness Visit Type:: Subsequent Annual Wellness Visit Persons participating in visit and providing information:: patient Medicare Wellness Visit Mode:: Telephone If telephone:: video declined Since this visit was completed virtually, some vitals may be partially provided or unavailable. Missing vitals are due to the limitations of the virtual format.: Unable to obtain vitals - no equipment If Telephone or Video please confirm:: I connected with patient using audio/video enable telemedicine. I verified patient identity with two identifiers, discussed telehealth limitations, and patient agreed to proceed. Patient Location:: Home Provider Location:: Office Interpreter Needed?: No Pre-visit prep was completed: yes AWV questionnaire completed by patient prior to visit?: no Living arrangements:: lives with spouse/significant other Patient's Overall Health Status Rating: good Typical amount of pain: none Does pain affect daily life?: no Are you currently prescribed opioids?: no  Dietary Habits and Nutritional Risks How many meals a day?: 3 Eats fruit and vegetables daily?: yes Most meals are obtained by: preparing own meals In the last 2 weeks, have you had any of the following?: none Diabetic:: no  Functional Status Activities of Daily Living (to include ambulation/medication): Independent Ambulation: Independent with device- listed below Home Assistive Devices/Equipment: Eyeglasses Medication Administration: Independent Home Management (perform basic housework or laundry): Independent Manage your own finances?: yes Primary transportation is: driving Concerns about vision?: no *vision screening is required for WTM* Concerns about hearing?: (!) yes Uses hearing aids?: (!) yes  (has hearing aides but does not wear them-per pt) Hear whispered voice?: (!) no *in-person visit only*  Fall Screening Falls in the past year?: 0 Number of falls in past year: 0 Was there an injury with Fall?: 0 Fall Risk Category Calculator: 0 Patient Fall Risk Level: Low Fall Risk  Fall Risk Patient at Risk for Falls Due to: No Fall Risks Fall risk Follow up: Falls evaluation completed; Falls prevention discussed  Home and Transportation Safety: All rugs have non-skid backing?: N/A, no rugs All stairs or steps have railings?: yes (back steps) Grab bars in the bathtub or shower?: (!) no Have non-skid surface in bathtub or shower?: (!) no Good home lighting?: yes Regular seat belt use?: yes Hospital stays in the last year:: no  Cognitive Assessment Difficulty concentrating, remembering, or making decisions? : no Will 6CIT or Mini Cog be Completed: no 6CIT or Mini Cog Declined: patient alert, oriented, able to answer questions appropriately and recall recent events  Advance Directives (For Healthcare) Does Patient Have a Medical Advance Directive?: No  Reviewed/Updated  Reviewed/Updated: Reviewed All (Medical, Surgical, Family, Medications, Allergies, Care Teams, Patient Goals)    Allergies (verified) Patient has no allergy information on record.   Current Medications (verified) Outpatient Encounter Medications as of 05/08/2024  Medication Sig   albuterol  (VENTOLIN  HFA) 108 (90 Base) MCG/ACT inhaler Inhale 1-2 puffs into the lungs every 6 (six) hours as needed for wheezing or shortness of breath.   atenolol  (TENORMIN ) 25 MG tablet Take 1 tablet (25 mg total) by mouth daily.   citalopram  (CELEXA ) 20 MG tablet TAKE 1 TABLET BY MOUTH EVERY DAY   clonazePAM  (KLONOPIN ) 0.5 MG tablet TAKE 1 TABLET BY MOUTH TWICE A DAY AS NEEDED FOR ANXIETY   cyclobenzaprine  (FLEXERIL ) 5 MG tablet Take 1 tablet (5 mg total) by mouth 3 (three) times daily as  needed.   diphenhydrAMINE  (BENADRYL )  25 MG tablet Take 1 tablet (25 mg total) by mouth every 6 (six) hours as needed.   famotidine  (PEPCID ) 20 MG tablet Take 1 tablet (20 mg total) by mouth at bedtime.   finasteride  (PROSCAR ) 5 MG tablet Take 1 tablet (5 mg total) by mouth daily.   lactulose  (CHRONULAC ) 10 GM/15ML solution TAKE 45 MLS (30 G TOTAL) BY MOUTH DAILY AS NEEDED FOR MILD CONSTIPATION.   levocetirizine (XYZAL ) 5 MG tablet Take 1 tablet (5 mg total) by mouth daily as needed for allergies.   lidocaine  (LIDODERM ) 5 % Place 1 patch onto the skin daily. Remove & Discard patch within 12 hours or as directed by MD   losartan -hydrochlorothiazide  (HYZAAR) 50-12.5 MG tablet Take 1 tablet by mouth daily.   methimazole  (TAPAZOLE ) 5 MG tablet Take 1 tablet (5 mg total) by mouth 3 (three) times a week.   methylPREDNISolone  (MEDROL  DOSEPAK) 4 MG TBPK tablet Take as directed on packaging   pantoprazole  (PROTONIX ) 40 MG tablet TAKE 1 TABLET BY MOUTH TWICE A DAY   polyethylene glycol powder (GLYCOLAX /MIRALAX ) 17 GM/SCOOP powder Take 17 g by mouth daily.   rosuvastatin  (CRESTOR ) 40 MG tablet Take 1 tablet (40 mg total) by mouth daily.   tamsulosin (FLOMAX) 0.4 MG CAPS capsule Take 0.4 mg by mouth daily.   triamcinolone  (NASACORT ) 55 MCG/ACT AERO nasal inhaler Place 2 sprays into the nose daily.   No facility-administered encounter medications on file as of 05/08/2024.    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    BACK PAIN 02/16/2007   COPD (chronic obstructive pulmonary disease) (HCC)    Diverticulosis 09/27/2013   FOOT PAIN, CHRONIC 02/27/2008   GERD (gastroesophageal reflux disease)    Hepatitis C antibody test positive    HERPES ZOSTER, HX OF 02/27/2008   HYPERLIPIDEMIA 02/16/2007   HYPERTENSION 02/16/2007   Personal history of colonic polyps 03/08/2007   PROSTATE SPECIFIC ANTIGEN, ELEVATED 02/16/2007   Shortness of breath dyspnea    Past Surgical History:   Procedure Laterality Date   COLONOSCOPY     FOOT SURGERY     reconstruction x's 4 after work accident   Family History  Problem Relation Age of Onset   Cancer Mother        throat & lung   Coronary artery disease Father    Hypertension Father    Cancer Sister    Alcohol abuse Brother    Thyroid  disease Neg Hx    Colon cancer Neg Hx    Esophageal cancer Neg Hx    Stomach cancer Neg Hx    Rectal cancer Neg Hx    Social History   Occupational History   Occupation: RETIRED  Tobacco Use   Smoking status: Former    Current packs/day: 0.00    Average packs/day: 0.3 packs/day for 30.0 years (7.5 ttl pk-yrs)    Types: Cigarettes    Start date: 04/27/1954    Quit date: 04/27/1984    Years since quitting: 40.0   Smokeless tobacco: Never  Vaping Use   Vaping status: Never Used  Substance and Sexual Activity   Alcohol use: No    Alcohol/week: 0.0 standard drinks of alcohol    Comment: no   Drug use: No   Sexual activity: Yes    Partners: Female   Tobacco Counseling Counseling given: Not Answered  SDOH Screenings   Food Insecurity: No Food Insecurity (  05/08/2024)  Housing: Unknown (05/08/2024)  Transportation Needs: No Transportation Needs (05/08/2024)  Utilities: Not At Risk (05/08/2024)  Alcohol Screen: Low Risk (05/07/2023)  Depression (PHQ2-9): Low Risk (05/08/2024)  Financial Resource Strain: Medium Risk (05/07/2023)  Physical Activity: Inactive (05/08/2024)  Social Connections: Moderately Isolated (05/08/2024)  Stress: No Stress Concern Present (05/08/2024)  Tobacco Use: Medium Risk (05/08/2024)  Health Literacy: Adequate Health Literacy (05/08/2024)   See flowsheets for full screening details  Depression Screen PHQ 2 & 9 Depression Scale- Over the past 2 weeks, how often have you been bothered by any of the following problems? Little interest or pleasure in doing things: 0 Feeling down, depressed, or hopeless (PHQ Adolescent also includes...irritable): 0 PHQ-2 Total  Score: 0 Trouble falling or staying asleep, or sleeping too much: 0 Feeling tired or having little energy: 0 Poor appetite or overeating (PHQ Adolescent also includes...weight loss): 0 Feeling bad about yourself - or that you are a failure or have let yourself or your family down: 0 Trouble concentrating on things, such as reading the newspaper or watching television (PHQ Adolescent also includes...like school work): 0 Moving or speaking so slowly that other people could have noticed. Or the opposite - being so fidgety or restless that you have been moving around a lot more than usual: 0 Thoughts that you would be better off dead, or of hurting yourself in some way: 0 PHQ-9 Total Score: 0 If you checked off any problems, how difficult have these problems made it for you to do your work, take care of things at home, or get along with other people?: Not difficult at all  Depression Treatment Depression Interventions/Treatment : EYV7-0 Score <4 Follow-up Not Indicated     Goals Addressed             This Visit's Progress    Patient Stated       05/07/2023, denies goals SAME for -2026             Objective:    Today's Vitals   05/08/24 1059  Weight: 181 lb (82.1 kg)  Height: 5' 6 (1.676 m)   Body mass index is 29.21 kg/m.  Hearing/Vision screen Hearing Screening - Comments:: Has hearing aides but does not wear them Vision Screening - Comments:: Wears eyeglasses/UTD/[employer] Immunizations and Health Maintenance Health Maintenance  Topic Date Due   Zoster Vaccines- Shingrix (2 of 2) 10/13/2022   COVID-19 Vaccine (4 - 2025-26 season) 12/27/2023   Medicare Annual Wellness (AWV)  05/08/2025   DTaP/Tdap/Td (3 - Td or Tdap) 12/04/2026   Pneumococcal Vaccine: 50+ Years  Completed   Influenza Vaccine  Completed   Hepatitis C Screening  Completed   Meningococcal B Vaccine  Aged Out   Colonoscopy  Discontinued        Assessment/Plan:  This is a routine wellness  examination for Amareon.  Patient Care Team: Norleen Lynwood ORN, MD as PCP - General (Internal Medicine) Nieves Cough, MD as Attending Physician (Urology) Trixie File, MD as Consulting Physician (Internal Medicine) Avram Lupita BRAVO, MD as Consulting Physician (Gastroenterology)  I have personally reviewed and noted the following in the patients chart:   Medical and social history Use of alcohol, tobacco or illicit drugs  Current medications and supplements including opioid prescriptions. Functional ability and status Nutritional status Physical activity Advanced directives List of other physicians Hospitalizations, surgeries, and ER visits in previous 12 months Vitals Screenings to include cognitive, depression, and falls Referrals and appointments  No orders of the defined  types were placed in this encounter.  In addition, I have reviewed and discussed with patient certain preventive protocols, quality metrics, and best practice recommendations. A written personalized care plan for preventive services as well as general preventive health recommendations were provided to patient.   Fusaye Wachtel L Naquan Garman, CMA   05/08/2024   Return in 1 year (on 05/08/2025).  After Visit Summary: (Mail) Due to this being a telephonic visit, the after visit summary with patients personalized plan was offered to patient via mail   Nurse Notes: Patient is due for a 2nd Shingrix vaccine. Patient is up to date on all health maintenance with no concerns to address today.  "

## 2024-05-10 ENCOUNTER — Ambulatory Visit

## 2024-05-31 ENCOUNTER — Telehealth: Payer: Self-pay

## 2024-05-31 NOTE — Telephone Encounter (Signed)
 Copied from CRM [id number]. Topic: Clinical - Medication Refill >> May 31, 2024 11:44 AM Deleta RAMAN wrote: Medication: finasteride  5 mg tablet   Has the patient contacted their pharmacy? No (Agent: If no, request that the patient contact the pharmacy for the refill. If patient does not wish to contact the pharmacy document the reason why and proceed with request.) (Agent: If yes, when and what did the pharmacy advise?)  This is the patient's preferred pharmacy:  [id number] [address] [place], [place] - [address] [place] [address] Phone: [phone] Fax: [phone]    Is this the correct pharmacy for this prescription? Yes If no, delete pharmacy and type the correct one.   Has the prescription been filled recently? No  Is the patient out of the medication? Yes  Has the patient been seen for an appointment in the last year OR does the patient have an upcoming appointment? Yes  Can we respond through MyChart? No  Agent: Please be advised that Rx refills may take up to 3 business days. We ask that you follow-up with your pharmacy.

## 2024-06-01 ENCOUNTER — Other Ambulatory Visit: Payer: Self-pay

## 2024-06-01 MED ORDER — FINASTERIDE 5 MG PO TABS: 5.0000 mg | ORAL_TABLET | Freq: Every day | ORAL | 3 refills | Status: AC

## 2024-06-01 NOTE — Telephone Encounter (Signed)
 Refill has been sent to the pharmacy.

## 2024-11-23 ENCOUNTER — Encounter: Admitting: Family Medicine
